# Patient Record
Sex: Male | Born: 1950
Health system: Southern US, Community
[De-identification: ages and names within clinical notes are randomized; demographics above are authoritative.]

## PROBLEM LIST (undated history)

## (undated) DIAGNOSIS — C4491 Basal cell carcinoma of skin, unspecified: Secondary | ICD-10-CM

## (undated) DIAGNOSIS — E785 Hyperlipidemia, unspecified: Secondary | ICD-10-CM

## (undated) DIAGNOSIS — R0989 Other specified symptoms and signs involving the circulatory and respiratory systems: Secondary | ICD-10-CM

## (undated) DIAGNOSIS — R7303 Prediabetes: Secondary | ICD-10-CM

## (undated) DIAGNOSIS — M1990 Unspecified osteoarthritis, unspecified site: Secondary | ICD-10-CM

## (undated) DIAGNOSIS — E291 Testicular hypofunction: Secondary | ICD-10-CM

## (undated) DIAGNOSIS — E559 Vitamin D deficiency, unspecified: Secondary | ICD-10-CM

## (undated) DIAGNOSIS — K219 Gastro-esophageal reflux disease without esophagitis: Secondary | ICD-10-CM

## (undated) DIAGNOSIS — I700 Atherosclerosis of aorta: Secondary | ICD-10-CM

## (undated) HISTORY — PX: OTHER SURGICAL HISTORY: SHX169

## (undated) HISTORY — DX: Testicular hypofunction: E29.1

## (undated) HISTORY — DX: Prediabetes: R73.03

## (undated) HISTORY — DX: Basal cell carcinoma of skin, unspecified: C44.91

## (undated) HISTORY — DX: Other specified symptoms and signs involving the circulatory and respiratory systems: R09.89

## (undated) HISTORY — PX: SKIN CANCER EXCISION: SHX779

## (undated) HISTORY — DX: Vitamin D deficiency, unspecified: E55.9

## (undated) HISTORY — DX: Hyperlipidemia, unspecified: E78.5

---

## 1986-06-22 HISTORY — PX: KNEE ARTHROSCOPY: SHX127

## 2002-05-26 ENCOUNTER — Emergency Department (HOSPITAL_COMMUNITY): Admission: EM | Admit: 2002-05-26 | Discharge: 2002-05-26 | Payer: Self-pay | Admitting: Emergency Medicine

## 2004-08-01 LAB — HM COLONOSCOPY

## 2011-06-19 ENCOUNTER — Ambulatory Visit (HOSPITAL_COMMUNITY)
Admission: RE | Admit: 2011-06-19 | Discharge: 2011-06-19 | Disposition: A | Discharge status: Home / Self Care | Payer: 59 | Source: Ambulatory Visit | Attending: Internal Medicine | Admitting: Internal Medicine

## 2011-06-19 ENCOUNTER — Other Ambulatory Visit (HOSPITAL_COMMUNITY): Payer: Self-pay | Admitting: Internal Medicine

## 2011-06-19 DIAGNOSIS — R062 Wheezing: Secondary | ICD-10-CM | POA: Insufficient documentation

## 2011-06-19 DIAGNOSIS — F17200 Nicotine dependence, unspecified, uncomplicated: Secondary | ICD-10-CM | POA: Insufficient documentation

## 2013-01-29 IMAGING — CR DG CHEST 2V
2 series · 2 of 2 positions shown · non-contrast
Comparison: None.

CLINICAL DATA: Wheezing.  Smoker.

CHEST - 2 VIEW

[view not recorded (1 of 2)]
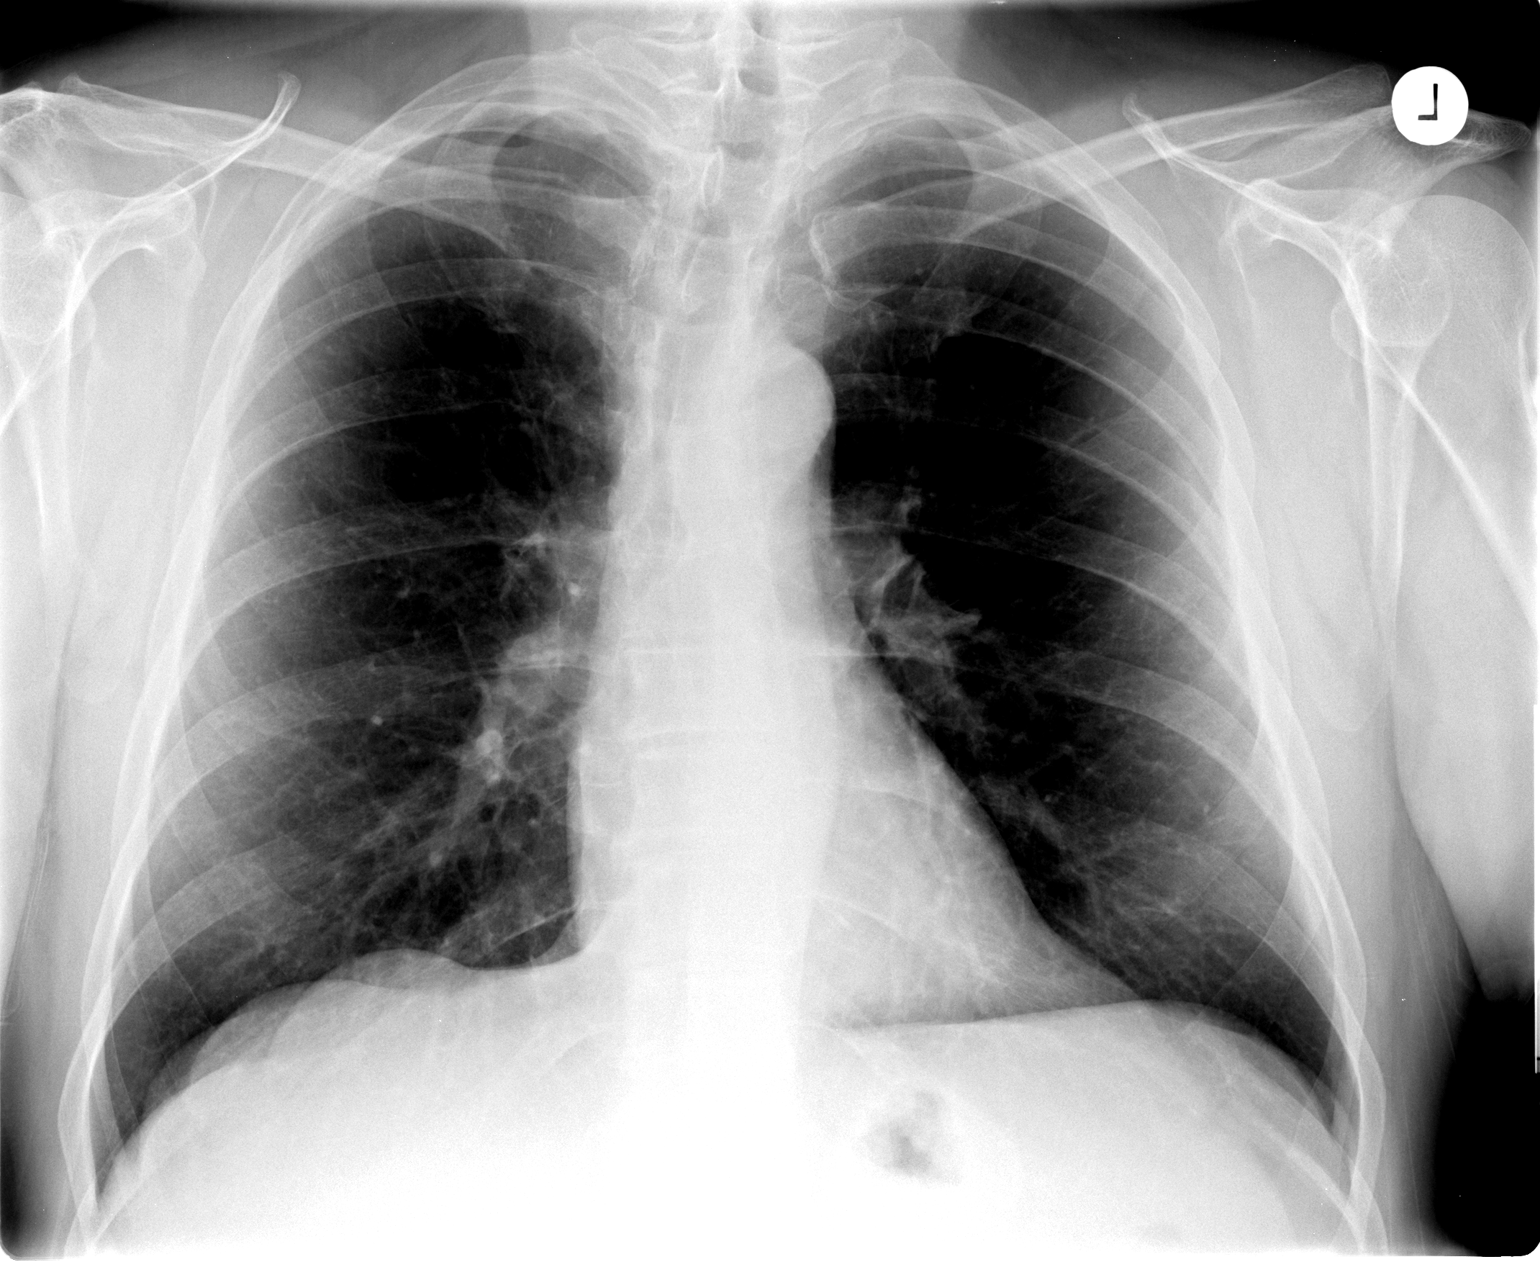

[view not recorded (2 of 2)]
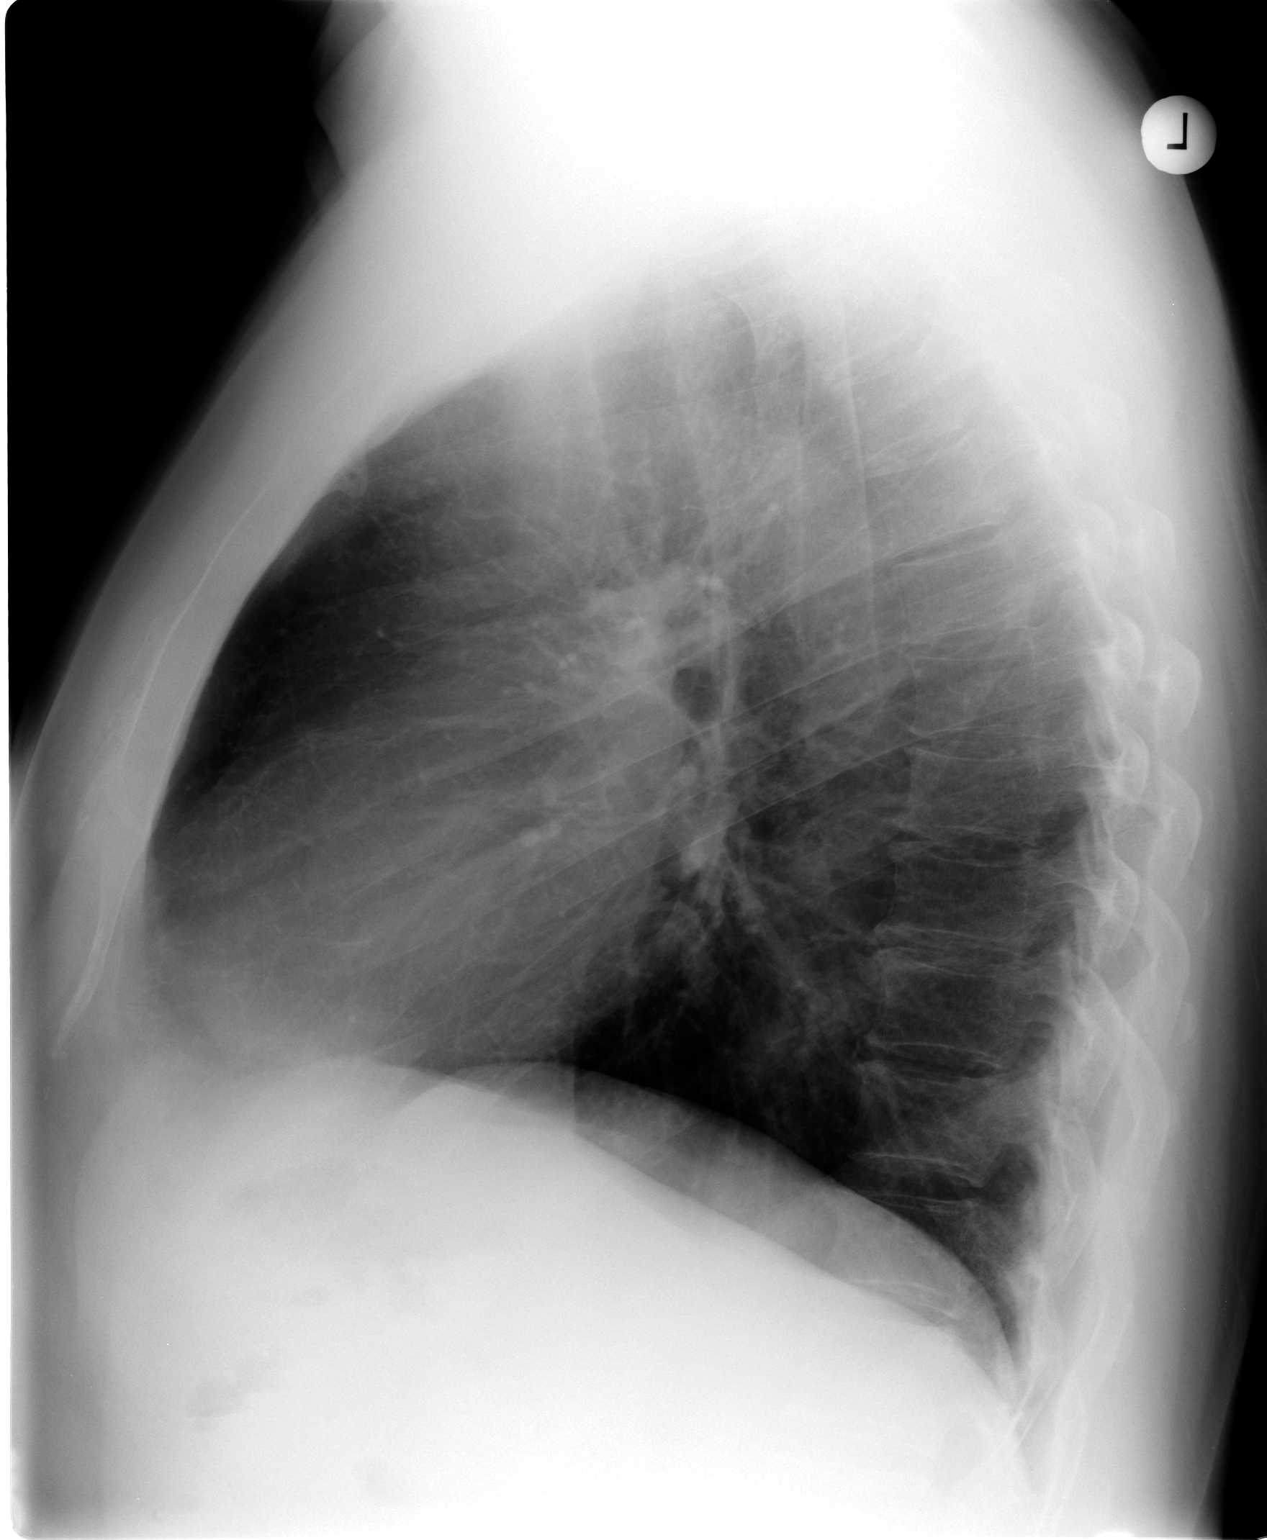

[2 of 2 positions shown; findings below may reference images not displayed]

FINDINGS: Normal heart, mediastinal, and hilar contours.  Lung
volumes are within normal limits.  The lungs are clear.  Negative
for pleural effusion or pneumothorax.  The trachea is midline.
Slight convex right scoliosis of the upper thoracic spine.
IMPRESSION: No acute cardiopulmonary disease.

## 2013-09-25 DIAGNOSIS — E782 Mixed hyperlipidemia: Secondary | ICD-10-CM | POA: Insufficient documentation

## 2013-09-25 DIAGNOSIS — E291 Testicular hypofunction: Secondary | ICD-10-CM | POA: Insufficient documentation

## 2013-09-25 DIAGNOSIS — R7309 Other abnormal glucose: Secondary | ICD-10-CM | POA: Insufficient documentation

## 2013-09-25 DIAGNOSIS — E559 Vitamin D deficiency, unspecified: Secondary | ICD-10-CM | POA: Insufficient documentation

## 2013-09-25 DIAGNOSIS — E785 Hyperlipidemia, unspecified: Secondary | ICD-10-CM

## 2013-09-25 DIAGNOSIS — R0989 Other specified symptoms and signs involving the circulatory and respiratory systems: Secondary | ICD-10-CM | POA: Insufficient documentation

## 2013-09-27 ENCOUNTER — Encounter: Payer: Self-pay | Admitting: Internal Medicine

## 2013-10-05 ENCOUNTER — Ambulatory Visit (INDEPENDENT_AMBULATORY_CARE_PROVIDER_SITE_OTHER): Payer: BC Managed Care – PPO | Admitting: Physician Assistant

## 2013-10-05 ENCOUNTER — Encounter: Payer: Self-pay | Admitting: Physician Assistant

## 2013-10-05 VITALS — BP 118/62 | HR 76 | Temp 98.5°F | Resp 16 | Ht 73.0 in | Wt 222.0 lb

## 2013-10-05 DIAGNOSIS — Z79899 Other long term (current) drug therapy: Secondary | ICD-10-CM

## 2013-10-05 DIAGNOSIS — Z125 Encounter for screening for malignant neoplasm of prostate: Secondary | ICD-10-CM

## 2013-10-05 DIAGNOSIS — R0989 Other specified symptoms and signs involving the circulatory and respiratory systems: Secondary | ICD-10-CM

## 2013-10-05 DIAGNOSIS — Z0000 Encounter for general adult medical examination without abnormal findings: Secondary | ICD-10-CM

## 2013-10-05 DIAGNOSIS — E559 Vitamin D deficiency, unspecified: Secondary | ICD-10-CM

## 2013-10-05 DIAGNOSIS — R7303 Prediabetes: Secondary | ICD-10-CM

## 2013-10-05 DIAGNOSIS — I10 Essential (primary) hypertension: Secondary | ICD-10-CM

## 2013-10-05 DIAGNOSIS — Z1211 Encounter for screening for malignant neoplasm of colon: Secondary | ICD-10-CM

## 2013-10-05 LAB — CBC WITH DIFFERENTIAL/PLATELET
Basophils Absolute: 0.1 10*3/uL (ref 0.0–0.1)
Basophils Relative: 1 % (ref 0–1)
Eosinophils Absolute: 0.1 10*3/uL (ref 0.0–0.7)
Eosinophils Relative: 1 % (ref 0–5)
HCT: 41.9 % (ref 39.0–52.0)
Hemoglobin: 15 g/dL (ref 13.0–17.0)
Lymphocytes Relative: 35 % (ref 12–46)
Lymphs Abs: 2.1 10*3/uL (ref 0.7–4.0)
MCH: 34.7 pg — ABNORMAL HIGH (ref 26.0–34.0)
MCHC: 35.8 g/dL (ref 30.0–36.0)
MCV: 97 fL (ref 78.0–100.0)
Monocytes Absolute: 0.4 10*3/uL (ref 0.1–1.0)
Monocytes Relative: 7 % (ref 3–12)
Neutro Abs: 3.4 10*3/uL (ref 1.7–7.7)
Neutrophils Relative %: 56 % (ref 43–77)
Platelets: 279 10*3/uL (ref 150–400)
RBC: 4.32 MIL/uL (ref 4.22–5.81)
RDW: 12.8 % (ref 11.5–15.5)
WBC: 6 10*3/uL (ref 4.0–10.5)

## 2013-10-05 LAB — BASIC METABOLIC PANEL WITH GFR
BUN: 21 mg/dL (ref 6–23)
CO2: 27 mEq/L (ref 19–32)
Calcium: 9.3 mg/dL (ref 8.4–10.5)
Chloride: 103 mEq/L (ref 96–112)
Creat: 1.06 mg/dL (ref 0.50–1.35)
GFR, Est African American: 86 mL/min
GFR, Est Non African American: 74 mL/min
Glucose, Bld: 87 mg/dL (ref 70–99)
Potassium: 4.3 mEq/L (ref 3.5–5.3)
Sodium: 138 mEq/L (ref 135–145)

## 2013-10-05 LAB — LIPID PANEL
CHOL/HDL RATIO: 4.9 ratio
Cholesterol: 166 mg/dL (ref 0–200)
HDL: 34 mg/dL — ABNORMAL LOW (ref >39)
LDL CALC: 64 mg/dL (ref 0–99)
Triglycerides: 340 mg/dL — ABNORMAL HIGH (ref <150)
VLDL: 68 mg/dL — AB (ref 0–40)

## 2013-10-05 LAB — IRON AND TIBC
%SAT: 33 % (ref 20–55)
Iron: 113 ug/dL (ref 42–165)
TIBC: 343 ug/dL (ref 215–435)
UIBC: 230 ug/dL (ref 125–400)

## 2013-10-05 LAB — VITAMIN B12: VITAMIN B 12: 351 pg/mL (ref 211–911)

## 2013-10-05 LAB — HEPATIC FUNCTION PANEL
ALT: 29 U/L (ref 0–53)
AST: 25 U/L (ref 0–37)
Albumin: 4.7 g/dL (ref 3.5–5.2)
Alkaline Phosphatase: 73 U/L (ref 39–117)
Bilirubin, Direct: 0.1 mg/dL (ref 0.0–0.3)
Indirect Bilirubin: 0.5 mg/dL (ref 0.2–1.2)
Total Bilirubin: 0.6 mg/dL (ref 0.2–1.2)
Total Protein: 7.1 g/dL (ref 6.0–8.3)

## 2013-10-05 LAB — TESTOSTERONE: TESTOSTERONE: 242 ng/dL — AB (ref 300–890)

## 2013-10-05 LAB — MAGNESIUM: Magnesium: 2 mg/dL (ref 1.5–2.5)

## 2013-10-05 LAB — HEMOGLOBIN A1C
Hgb A1c MFr Bld: 5.6 % (ref <5.7)
Mean Plasma Glucose: 114 mg/dL (ref <117)

## 2013-10-05 LAB — TSH: TSH: 1.466 u[IU]/mL (ref 0.350–4.500)

## 2013-10-05 NOTE — Patient Instructions (Addendum)
Preventative Care for Adults, Male       REGULAR HEALTH EXAMS:  A routine yearly physical is a good way to check in with your primary care provider about your health and preventive screening. It is also an opportunity to share updates about your health and any concerns you have, and receive a thorough all-over exam.   Most health insurance companies pay for at least some preventative services.  Check with your health plan for specific coverages.  WHAT PREVENTATIVE SERVICES DO MEN NEED?  Adult men should have their weight and blood pressure checked regularly.   Men age 35 and older should have their cholesterol levels checked regularly.  Beginning at age 50 and continuing to age 75, men should be screened for colorectal cancer.  Certain people should may need continued testing until age 85.  Other cancer screening may include exams for testicular and prostate cancer.  Updating vaccinations is part of preventative care.  Vaccinations help protect against diseases such as the flu.  Lab tests are generally done as part of preventative care to screen for anemia and blood disorders, to screen for problems with the kidneys and liver, to screen for bladder problems, to check blood sugar, and to check your cholesterol level.  Preventative services generally include counseling about diet, exercise, avoiding tobacco, drugs, excessive alcohol consumption, and sexually transmitted infections.    GENERAL RECOMMENDATIONS FOR GOOD HEALTH:  Healthy diet:  Eat a variety of foods, including fruit, vegetables, animal or vegetable protein, such as meat, fish, chicken, and eggs, or beans, lentils, tofu, and grains, such as rice.  Drink plenty of water daily.  Decrease saturated fat in the diet, avoid lots of red meat, processed foods, sweets, fast foods, and fried foods.  Exercise:  Aerobic exercise helps maintain good heart health. At least 30-40 minutes of moderate-intensity exercise is recommended.  For example, a brisk walk that increases your heart rate and breathing. This should be done on most days of the week.   Find a type of exercise or a variety of exercises that you enjoy so that it becomes a part of your daily life.  Examples are running, walking, swimming, water aerobics, and biking.  For motivation and support, explore group exercise such as aerobic class, spin class, Zumba, Yoga,or  martial arts, etc.    Set exercise goals for yourself, such as a certain weight goal, walk or run in a race such as a 5k walk/run.  Speak to your primary care provider about exercise goals.  Disease prevention:  If you smoke or chew tobacco, find out from your caregiver how to quit. It can literally save your life, no matter how long you have been a tobacco user. If you do not use tobacco, never begin.   Maintain a healthy diet and normal weight. Increased weight leads to problems with blood pressure and diabetes.   The Body Mass Index or BMI is a way of measuring how much of your body is fat. Having a BMI above 27 increases the risk of heart disease, diabetes, hypertension, stroke and other problems related to obesity. Your caregiver can help determine your BMI and based on it develop an exercise and dietary program to help you achieve or maintain this important measurement at a healthful level.  High blood pressure causes heart and blood vessel problems.  Persistent high blood pressure should be treated with medicine if weight loss and exercise do not work.   Fat and cholesterol leaves deposits in your arteries   that can block them. This causes heart disease and vessel disease elsewhere in your body.  If your cholesterol is found to be high, or if you have heart disease or certain other medical conditions, then you may need to have your cholesterol monitored frequently and be treated with medication.   Ask if you should have a stress test if your history suggests this. A stress test is a test done on  a treadmill that looks for heart disease. This test can find disease prior to there being a problem.  Avoid drinking alcohol in excess (more than two drinks per day).  Avoid use of street drugs. Do not share needles with anyone. Ask for professional help if you need assistance or instructions on stopping the use of alcohol, cigarettes, and/or drugs.  Brush your teeth twice a day with fluoride toothpaste, and floss once a day. Good oral hygiene prevents tooth decay and gum disease. The problems can be painful, unattractive, and can cause other health problems. Visit your dentist for a routine oral and dental check up and preventive care every 6-12 months.   Look at your skin regularly.  Use a mirror to look at your back. Notify your caregivers of changes in moles, especially if there are changes in shapes, colors, a size larger than a pencil eraser, an irregular border, or development of new moles.  Safety:  Use seatbelts 100% of the time, whether driving or as a passenger.  Use safety devices such as hearing protection if you work in environments with loud noise or significant background noise.  Use safety glasses when doing any work that could send debris in to the eyes.  Use a helmet if you ride a bike or motorcycle.  Use appropriate safety gear for contact sports.  Talk to your caregiver about gun safety.  Use sunscreen with a SPF (or skin protection factor) of 15 or greater.  Lighter skinned people are at a greater risk of skin cancer. Don't forget to also wear sunglasses in order to protect your eyes from too much damaging sunlight. Damaging sunlight can accelerate cataract formation.   Practice safe sex. Use condoms. Condoms are used for birth control and to help reduce the spread of sexually transmitted infections (or STIs).  Some of the STIs are gonorrhea (the clap), chlamydia, syphilis, trichomonas, herpes, HPV (human papilloma virus) and HIV (human immunodeficiency virus) which causes AIDS.  The herpes, HIV and HPV are viral illnesses that have no cure. These can result in disability, cancer and death.   Keep carbon monoxide and smoke detectors in your home functioning at all times. Change the batteries every 6 months or use a model that plugs into the wall.   Vaccinations:  Stay up to date with your tetanus shots and other required immunizations. You should have a booster for tetanus every 10 years. Be sure to get your flu shot every year, since 5%-20% of the [place] population comes down with the flu. The flu vaccine changes each year, so being vaccinated once is not enough. Get your shot in the fall, before the flu season peaks.   Other vaccines to consider:  Pneumococcal vaccine to protect against certain types of pneumonia.  This is normally recommended for adults age 65 or older.  However, adults younger than 65 years old with certain underlying conditions such as diabetes, heart or lung disease should also receive the vaccine.  Shingles vaccine to protect against Varicella Zoster if you are older than age 60, or younger   than 64 years old with certain underlying illness.  Hepatitis A vaccine to protect against a form of infection of the liver by a virus acquired from food.  Hepatitis B vaccine to protect against a form of infection of the liver by a virus acquired from blood or body fluids, particularly if you work in health care.  If you plan to travel internationally, check with your local health department for specific vaccination recommendations.  Cancer Screening:  Most routine colon cancer screening begins at the age of 102. On a yearly basis, doctors may provide special easy to use take-home tests to check for hidden blood in the stool. Sigmoidoscopy or colonoscopy can detect the earliest forms of colon cancer and is life saving. These tests use a small camera at the end of a tube to directly examine the colon. Speak to your caregiver about this at age 45, when routine  screening begins (and is repeated every 5 years unless early forms of pre-cancerous polyps or small growths are found).   At the age of 45 men usually start screening for prostate cancer every year. Screening may begin at a younger age for those with higher risk. Those at higher risk include African-Americans or having a family history of prostate cancer. There are two types of tests for prostate cancer:   Prostate-specific antigen (PSA) testing. Recent studies raise questions about prostate cancer using PSA and you should discuss this with your caregiver.   Digital rectal exam (in which your doctor's lubricated and gloved finger feels for enlargement of the prostate through the anus).   Screening for testicular cancer.  Do a monthly exam of your testicles. Gently roll each testicle between your thumb and fingers, feeling for any abnormal lumps. The best time to do this is after a hot shower or bath when the tissues are looser. Notify your caregivers of any lumps, tenderness or changes in size or shape immediately.    Lateral Collateral Knee Ligament Sprain with Phase I Rehab The lateral collateral ligament (LCL) of the knee helps hold the knee joint in proper alignment and prevents the bones from shifting out of alignment (displacing) toward the outside (laterally). Injury to the knee may cause a tear in the LCL ligament (sprain). The LCL is the least common ligament of the knee to be injured. Sprains may heal on their own, but they often result in a loose joint. Sprains are classified into three categories. Grade 1 sprains cause pain, but the tendon is not lengthened. Grade 2 sprains include a lengthened ligament, due to the ligament being stretched or partially ruptured. With grade 2 sprains there is still function, although the function may be decreased. Grade 3 sprains involve a complete tear of the tendon or muscle, and function is usually impaired. SYMPTOMS   Pain and tenderness on the outer  side of the knee.  A "pop", tearing, or pulling sensation at the time of injury.  Bruising (contusion) at the site of injury within 48 hours of injury.  Knee stiffness.  Limping, often walking with the knee bent. CAUSES  An LCL sprain occurs when a force is placed on the ligament that is greater than it can handle. Common causes of injury include:  Direct hit (trauma) to the inner side of the knee, especially if the foot is planted on the ground.  Forceful pivoting of the body and leg, while the foot is planted on the ground. RISK INCREASES WITH:  Contact sports (football, rugby).  Sports that require  pivoting or cutting (soccer).  Poor knee strength and flexibility.  Improper equipment use. PREVENTION   Warm up and stretch properly before activity.  Maintain physical fitness:  Strength, flexibility, and endurance.  Cardiovascular fitness.  Wear properly fitted protective equipment (correct length of cleats for surface).  Functional braces may be effective in preventing injury. PROGNOSIS  If treated properly, LCL tears usually heal on their own. Sometimes, surgery is required. RELATED COMPLICATIONS   Frequently recurring symptoms, such as knee giving way, instability, and swelling.  Injury to other structures in the knee joint.  Meniscal cartilage, resulting in locking and swelling of the knee.  Articular cartilage, resulting in knee arthritis.  Other ligaments of the knee (commonly).  Injury to nerves, causing numbness of the outer leg, foot, and ankle and weakness or paralysis, with inability to raise the ankle, big toe, or lesser toes.  Knee stiffness (loss of knee motion). TREATMENT  Treatment first involves the use of ice and medicine, to reduce pain and inflammation. The use of strengthening and stretching exercises may help reduce pain with activity. These exercises may be performed at home, but referral to a therapist is often advised. You may be advised  to walk with crutches, until you are able to walk without a limp. Your caregiver may provide you with a hinged knee brace to help regain a full range of motion, while also protecting the injured knee. For severe LCL injuries, or injuries that involve other ligaments of the knee, surgery is often advised. MEDICATION   If pain medicine is needed, nonsteroidal anti-inflammatory medicines (aspirin and ibuprofen), or other minor pain relievers (acetaminophen), are often advised.  Do not take pain medicine for 7 days before surgery.  Prescription pain relievers may be given, if your caregiver thinks they are needed. Use only as directed and only as much as you need. HEAT AND COLD  Cold treatment (icing) should be applied for 10 to 15 minutes every 2 to 3 hours for inflammation and pain, and immediately after activity that aggravates your symptoms. Use ice packs or an ice massage.  Heat treatment may be used before performing stretching and strengthening activities prescribed by your caregiver, physical therapist, or athletic trainer. Use a heat pack or a warm water soak. SEEK MEDICAL CARE IF:   Symptoms get worse or do not improve in 4 to 6 weeks, despite treatment.  New, unexplained symptoms develop. (Drugs used in treatment may produce side effects.) EXERCISES RANGE OF MOTION (ROM) AND STRETCHING EXERCISES - Lateral Collateral Knee Ligament Sprain Phase I These are some of the initial exercises that your physician, physical therapist or athletic trainer may have you perform to begin your rehabilitation. When you demonstrate gains in your flexibility and strength, your caregiver may progress you to Phase II exercises. As you perform these exercises, remember:   These initial exercises are intended to be gentle. They will help you restore motion without increasing any swelling.  Completing these exercises allows less painful movement and prepares you for the more aggressive strengthening exercises  in Phase II.  An effective stretch should be held for at least 30 seconds.  A stretch should never be painful. You should only feel a gentle lengthening or release in the stretched tissue. RANGE OF MOTION - Knee Flexion, Active  Lie on your back with both knees straight. (If this causes back discomfort, bend your opposite knee, placing your foot flat on the floor.)  Slowly slide your heel back toward your buttocks until you  feel a gentle stretch in the front of your knee or thigh.  Hold for __________ seconds. Slowly slide your heel back to the starting position. Repeat __________ times. Complete this exercise __________ times per day.  STRETCH - Knee Flexion, Supine  Lie on the floor with your right / left heel and foot lightly touching the wall. (Place both feet on the wall, if you do not use a door frame.)  Without using any effort, allow gravity to slide your foot down the wall slowly until you feel a gentle stretch in the front of your right / left knee.  Hold this stretch for __________ seconds. Then return the leg to the starting position, using your healthy leg for help, if needed. Repeat __________ times. Complete this stretch __________ times per day.  RANGE OF MOTION - Knee Flexion and Extension, Active-Assisted  Sit on the edge of a table or chair with your thighs firmly supported. It may be helpful to place a folded towel under the end of your right / left thigh.  Flexion (bending): Place the ankle of your healthy leg on top of the other ankle. Use your healthy leg to gently bend your right / left knee until you feel a mild tension across the top of your knee.  Hold for __________ seconds.  Extension (straightening): Switch your ankles so your right / left leg is on top. Use your healthy leg to straighten your right / left knee until you feel a mild tension on the backside of your knee.  Hold for __________ seconds. Repeat __________ times. Complete this exercise  __________ times per day. STRETCH - Knee Extension Sitting  Sit with yourright / left leg/heel propped on another chair, coffee table, or foot stool.  Allow your leg muscles to relax, letting gravity straighten out your knee.*  You should feel a stretch behind your right / left knee. Hold this position for __________ seconds. Repeat __________ times. Complete this stretch __________ times per day.  *Your physician, physical therapist or athletic trainer may instruct you place a __________ weight on your thigh, just above your kneecap, to deepen the stretch.  STRENGTHENING EXERCISES Lateral Collateral Knee Ligament Sprain - Phase I These exercises may help you when beginning to rehabilitate your injury. They may resolve your symptoms with or without further involvement from your physician, physical therapist or athletic trainer. While completing these exercises, remember:   Muscles can gain both the endurance and the strength needed for everyday activities through controlled exercises.  Complete these exercises as instructed by your physician, physical therapist or athletic trainer. Increase the resistance and repetitions only as guided.  In order to return to more demanding activities, you will likely need to progress to more challenging exercises. Your physician, physical therapist or athletic trainer will advance your exercises when your tissues show adequate healing and your muscles demonstrate increased strength. STRENGTH - Quadriceps, Isometrics  Lie on your back with your right / left leg extended and your opposite knee bent.  Gradually tense the muscles in the front of yourright / left thigh. You should see either your knee cap slide up toward your hip or increased dimpling just above the knee. This motion will push the back of the knee down toward the floor, mat, or bed on which you are lying.  Hold the muscle as tight as you can without increasing your pain for __________  seconds.  Relax the muscles slowly and completely between each repetition. Repeat __________ times. Complete this exercise  __________ times per day.  STRENGTH - Quadriceps, Short Arcs   Lie on your back. Place a __________ inch towel roll under your right / left knee, so that the knee bends slightly.  Raise only your lower leg by tightening the muscles in the front of your thigh. Do not allow your thigh to rise.  Hold this position for __________ seconds. Repeat __________ times. Complete this exercise __________ times per day.  OPTIONAL ANKLE WEIGHTS: Begin with ____________________, but DO NOT exceed ____________________. Increase in 1 pound/0.5 kilogram increments. STRENGTH - Quadriceps, Straight Leg Raises  Quality counts! Watch for signs that the quadriceps muscle is working, to be sure you are strengthening the correct muscles and not "cheating" by substituting with healthier muscles.  Lay on your back with your right / left leg extended and your opposite knee bent.  Tense the muscles in the front of your right / leftthigh. You should see either your knee cap slide up or increased dimpling just above the knee. Your thigh may even shake a bit.  Tighten these muscles even more and raise your leg 4 to 6 inches off the floor. Hold for __________ seconds.  Keeping these muscles tense, lower your leg.  Relax the muscles slowly and completely in between each repetition. Repeat __________ times. Complete this exercise __________ times per day.  STRENGTH - Hamstring, Isometrics   Lie on your back, on a firm surface.  Bend your right / left knee approximately __________ degrees.  Dig your heel into the surface as if you are trying to pull it toward your buttocks. Tighten the muscles in the back of your thighs to "dig" as hard as you can, without increasing any pain.  Hold this position for __________ seconds.  Release the tension gradually and allow your muscle to completely relax for  __________ seconds in between each exercise. Repeat __________ times. Complete this exercise __________ times per day.  STRENGTH - Hamstring, Curls   Lay on your stomach with your legs extended. (If you lay on a bed, your feet may hang over the edge.)  Tighten the muscles in the back of your thigh to bend your right / left knee up to 90 degrees. Keep your hips flat on the bed.  Hold this position for __________ seconds.  Slowly lower your leg back to the starting position. Repeat __________ times. Complete this exercise __________ times per day.  OPTIONAL ANKLE WEIGHTS: Begin with ____________________, but DO NOT exceed ____________________. Increase in 1 pound/0.5 kilogram increments. Document Released: 06/08/2005 Document Revised: 08/31/2011 Document Reviewed: 09/20/2008 [facility] Patient Information 2014 [place], [place].

## 2013-10-05 NOTE — Progress Notes (Signed)
Complete Physical HPI 63 y.o. male  presents for a complete physical. His blood pressure has been controlled at home, today their BP is BP: 118/62 mmHg He does workout, on a treadmill 3 x a week. He denies chest pain, shortness of breath, dizziness.  He is not on cholesterol medication and denies myalgias. His cholesterol is not at goal. The cholesterol last visit was:  Trigs 356, HDL 34, LDL 56- he has been decreasing alcohol.   Last A1C in the office was: 5.4 Hypogonadism- he is no longer on testosterone therapy.  Patient is on Vitamin D supplement. 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 He has had his left knee repaired and states it seems similar. He has popping, clicking, locking in that knee.  Current Medications:    Medication List       This list is accurate as of: 10/05/13 10:29 AM.  Always use your most recent med list.               ANDROGEL 40.5 MG/2.5GM (1.62%) Gel  Generic drug:  Testosterone  Place onto the skin.     BABY ASPIRIN PO  Take 81 mg by mouth daily.     citalopram 40 MG tablet  Commonly known as:  CELEXA  Take 40 mg by mouth daily.     VITAMIN D PO  Take 10,000 Int'l Units by mouth daily.        Health Maintenance:  Immunization History  Administered Date(s) Administered  . Influenza Split 04/13/2013  . Pneumococcal-Unspecified 06/22/2002  . Tdap 09/17/2011    Tetanus:2013 Pneumovax: 2004 Flu vaccine: 2013 Zostavax: states he had shingles in the past, will check titer DEXA: N/A Colonoscopy: 2006 due 2016 EGD: N/A  Allergies:  Allergies  Allergen Reactions  . Lipitor [Atorvastatin]   . Lopid [Gemfibrozil]   . Penicillins    Medical History:  Past Medical History  Diagnosis Date  . Labile hypertension   . Prediabetes   . Other testicular hypofunction   . Hyperlipidemia   . Vitamin D deficiency    Surgical History:  Past Surgical History  Procedure Laterality Date   . Knee arthroscopy Left 1988   Family History:  Family History  Problem Relation Age of Onset  . Diabetes Mother   . Alzheimer's disease Mother   . Heart disease Father   . Hypertension Father   . Stroke Father   . Colon polyps Father    Social History:   History  Substance Use Topics  . Smoking status: Former Research scientist (life sciences)  . Smokeless tobacco: Not on file  . Alcohol Use: Yes     Comment: Rare   ROS:  [X]  = complains of  [ ]  = denies  General: Fatigue [ ]  Fever [ ]  Chills [ ]  Weakness [ ]   Insomnia [ ]  Weight change [ ]  Night sweats [ ]   Change in appetite [ ]  Eyes: Redness [ ]  Blurred vision [ ]  Diplopia [ ]  Discharge [ ]   ENT: Congestion [ ]  Sinus Pain [ ]  Post Nasal Drip [ ]  Sore Throat [ ]  Earache [ ]  hearing loss [ ]  Tinnitus [ ]  Snoring [ ]   Cardiac: Chest pain/pressure [ ]  SOB [ ]  Orthopnea [ ]   Palpitations [ ]   Paroxysmal nocturnal dyspnea[ ]  Claudication [ ]  Edema [ ]   Pulmonary: Cough [ ]  Wheezing[ ]   SOB [ ]   Pleurisy [ ]   GI: Nausea [ ]   Vomiting[ ]  Dysphagia[ ]  Heartburn[ ]  Abdominal pain [ ]  Constipation [ ] ; Diarrhea [ ]  BRBPR [ ]  Melena[ ]  Bloating [ ]  Hemorrhoids [ ]   GU: Hematuria[ ]  Dysuria [ ]  Nocturia[ ]  Urgency [ ]   Hesitancy [ ]  Discharge [ ]  Frequency [ ]   Neuro: Headaches[ ]  Vertigo[ ]  Paresthesias[ ]  Spasm [ ]  Speech changes [ ]  Incoordination [ ]   Ortho: Arthritis [ ]  Joint pain [ ]  Muscle pain [ ]  Joint swelling [ ]  Back Pain [ ]  Skin:  Rash [ ]   Pruritis [ ]  Change in skin lesion [ ]   Psych: Depression[ ]  Anxiety[ ]  Confusion [ ]  Memory loss [ ]   Heme/Lypmh: Bleeding [ ]  Bruising [ ]  Enlarged lymph nodes [ ]   Endocrine: Visual blurring [ ]  Paresthesia [ ]  Polyuria [ ]  Polydypsea [ ]    Heat/cold intolerance [ ]  Hypoglycemia [ ]   Physical Exam: Estimated body mass index is 29.3 kg/(m^2) as calculated from the following:   Height as of this encounter: 6\' 1"  (1.854 m).   Weight as of this encounter: 222 lb (100.699 kg). BP 118/62  Pulse 76  Temp(Src)  98.5 F (36.9 C)  Resp 16  Ht 6\' 1"  (1.854 m)  Wt 222 lb (100.699 kg)  BMI 29.30 kg/m2 General Appearance: Well nourished, in no apparent distress. Eyes: PERRLA, EOMs, conjunctiva no swelling or erythema, normal fundi and vessels. Sinuses: No Frontal/maxillary tenderness ENT/Mouth: Ext aud canals clear, normal light reflex with TMs without erythema, bulging. Good dentition. No erythema, swelling, or exudate on post pharynx. Tonsils not swollen or erythematous. Hearing normal.  Neck: Supple, thyroid normal. No bruits Respiratory: Respiratory effort normal, BS equal bilaterally without rales, rhonchi, wheezing or stridor. Cardio: RRR without murmurs, rubs or gallops. Brisk peripheral pulses without edema.  Chest: symmetric, with normal excursions and percussion. Abdomen: Soft, +BS. Non tender, no guarding, rebound, hernias, masses, or organomegaly. .  Lymphatics: Non tender without lymphadenopathy.  Genitourinary: defer Musculoskeletal: Full ROM all peripheral extremities,5/5 strength, and normal gait. Skin: Warm, dry without rashes, lesions, ecchymosis.  Neuro: Cranial nerves intact, reflexes equal bilaterally. Normal muscle tone, no cerebellar symptoms. Sensation intact.  Psych: Awake and oriented X 3, normal affect, Insight and Judgment appropriate.   EKG: WNL no changes.  Assessment and Plan: Labile hypertension-- continue medications, DASH diet, exercise and monitor at home. Call if greater than 130/80.   Prediabetes-Discussed general issues about diabetes pathophysiology and management., Educational material distributed., Suggested low cholesterol diet., Encouraged aerobic exercise., Discussed foot care., Reminded to get yearly retinal exam.  Other testicular hypofunction- check testosterone  Hyperlipidemia- -continue medications, check lipids, decrease fatty foods, increase activity.   Vitamin D deficiency- cont vitamin D  POC hemoccult ? Meniscus or lateral collteral ligament  tear- follow up with ortho  Discussed med's effects and SE's. Screening labs and tests as requested with regular follow-up as recommended.  Vicie Mutters 10:17 AM

## 2013-10-06 LAB — URINALYSIS, ROUTINE W REFLEX MICROSCOPIC
Bilirubin Urine: NEGATIVE
Glucose, UA: NEGATIVE mg/dL
HGB URINE DIPSTICK: NEGATIVE
Ketones, ur: NEGATIVE mg/dL
Leukocytes, UA: NEGATIVE
NITRITE: NEGATIVE
PROTEIN: NEGATIVE mg/dL
Specific Gravity, Urine: 1.017 (ref 1.005–1.030)
Urobilinogen, UA: 0.2 mg/dL (ref 0.0–1.0)
pH: 6 (ref 5.0–8.0)

## 2013-10-06 LAB — VITAMIN D 25 HYDROXY (VIT D DEFICIENCY, FRACTURES): VIT D 25 HYDROXY: 70 ng/mL (ref 30–89)

## 2013-10-06 LAB — MICROALBUMIN / CREATININE URINE RATIO
Creatinine, Urine: 128.1 mg/dL
MICROALB UR: 0.5 mg/dL (ref 0.00–1.89)
MICROALB/CREAT RATIO: 3.9 mg/g (ref 0.0–30.0)

## 2013-10-06 LAB — PSA: PSA: 0.35 ng/mL (ref <=4.00)

## 2013-11-16 ENCOUNTER — Other Ambulatory Visit: Payer: Self-pay | Admitting: Emergency Medicine

## 2014-04-06 ENCOUNTER — Encounter: Payer: Self-pay | Admitting: Physician Assistant

## 2014-04-06 ENCOUNTER — Ambulatory Visit (INDEPENDENT_AMBULATORY_CARE_PROVIDER_SITE_OTHER): Payer: BC Managed Care – PPO | Admitting: Physician Assistant

## 2014-04-06 VITALS — BP 128/72 | HR 68 | Temp 97.9°F | Resp 16 | Ht 73.0 in | Wt 228.0 lb

## 2014-04-06 DIAGNOSIS — I10 Essential (primary) hypertension: Secondary | ICD-10-CM

## 2014-04-06 DIAGNOSIS — E291 Testicular hypofunction: Secondary | ICD-10-CM

## 2014-04-06 DIAGNOSIS — E785 Hyperlipidemia, unspecified: Secondary | ICD-10-CM

## 2014-04-06 DIAGNOSIS — F339 Major depressive disorder, recurrent, unspecified: Secondary | ICD-10-CM | POA: Insufficient documentation

## 2014-04-06 DIAGNOSIS — Z23 Encounter for immunization: Secondary | ICD-10-CM

## 2014-04-06 DIAGNOSIS — F418 Other specified anxiety disorders: Secondary | ICD-10-CM

## 2014-04-06 DIAGNOSIS — R7303 Prediabetes: Secondary | ICD-10-CM

## 2014-04-06 DIAGNOSIS — R7309 Other abnormal glucose: Secondary | ICD-10-CM

## 2014-04-06 DIAGNOSIS — R0989 Other specified symptoms and signs involving the circulatory and respiratory systems: Secondary | ICD-10-CM

## 2014-04-06 DIAGNOSIS — Z79899 Other long term (current) drug therapy: Secondary | ICD-10-CM

## 2014-04-06 DIAGNOSIS — F329 Major depressive disorder, single episode, unspecified: Secondary | ICD-10-CM | POA: Insufficient documentation

## 2014-04-06 DIAGNOSIS — E559 Vitamin D deficiency, unspecified: Secondary | ICD-10-CM

## 2014-04-06 LAB — LIPID PANEL
CHOL/HDL RATIO: 4.2 ratio
Cholesterol: 156 mg/dL (ref 0–200)
HDL: 37 mg/dL — AB (ref >39)
LDL CALC: 80 mg/dL (ref 0–99)
TRIGLYCERIDES: 197 mg/dL — AB (ref <150)
VLDL: 39 mg/dL (ref 0–40)

## 2014-04-06 LAB — CBC WITH DIFFERENTIAL/PLATELET
BASOS ABS: 0.1 10*3/uL (ref 0.0–0.1)
Basophils Relative: 1 % (ref 0–1)
Eosinophils Absolute: 0.1 10*3/uL (ref 0.0–0.7)
Eosinophils Relative: 2 % (ref 0–5)
HCT: 44.4 % (ref 39.0–52.0)
Hemoglobin: 15.5 g/dL (ref 13.0–17.0)
LYMPHS PCT: 40 % (ref 12–46)
Lymphs Abs: 2.2 10*3/uL (ref 0.7–4.0)
MCH: 34.4 pg — ABNORMAL HIGH (ref 26.0–34.0)
MCHC: 34.9 g/dL (ref 30.0–36.0)
MCV: 98.4 fL (ref 78.0–100.0)
Monocytes Absolute: 0.4 10*3/uL (ref 0.1–1.0)
Monocytes Relative: 8 % (ref 3–12)
NEUTROS ABS: 2.6 10*3/uL (ref 1.7–7.7)
Neutrophils Relative %: 49 % (ref 43–77)
Platelets: 266 10*3/uL (ref 150–400)
RBC: 4.51 MIL/uL (ref 4.22–5.81)
RDW: 12.7 % (ref 11.5–15.5)
WBC: 5.4 10*3/uL (ref 4.0–10.5)

## 2014-04-06 LAB — HEPATIC FUNCTION PANEL
ALK PHOS: 76 U/L (ref 39–117)
ALT: 36 U/L (ref 0–53)
AST: 24 U/L (ref 0–37)
Albumin: 4.4 g/dL (ref 3.5–5.2)
BILIRUBIN DIRECT: 0.1 mg/dL (ref 0.0–0.3)
BILIRUBIN INDIRECT: 0.7 mg/dL (ref 0.2–1.2)
TOTAL PROTEIN: 6.8 g/dL (ref 6.0–8.3)
Total Bilirubin: 0.8 mg/dL (ref 0.2–1.2)

## 2014-04-06 LAB — BASIC METABOLIC PANEL WITH GFR
BUN: 15 mg/dL (ref 6–23)
CHLORIDE: 105 meq/L (ref 96–112)
CO2: 26 mEq/L (ref 19–32)
Calcium: 9.4 mg/dL (ref 8.4–10.5)
Creat: 1.04 mg/dL (ref 0.50–1.35)
GFR, EST NON AFRICAN AMERICAN: 76 mL/min
GFR, Est African American: 88 mL/min
Glucose, Bld: 114 mg/dL — ABNORMAL HIGH (ref 70–99)
POTASSIUM: 4.2 meq/L (ref 3.5–5.3)
Sodium: 138 mEq/L (ref 135–145)

## 2014-04-06 LAB — MAGNESIUM: Magnesium: 1.9 mg/dL (ref 1.5–2.5)

## 2014-04-06 LAB — TSH: TSH: 1.311 u[IU]/mL (ref 0.350–4.500)

## 2014-04-06 LAB — HEMOGLOBIN A1C
Hgb A1c MFr Bld: 5.6 % (ref <5.7)
Mean Plasma Glucose: 114 mg/dL (ref <117)

## 2014-04-06 NOTE — Progress Notes (Signed)
Assessment and Plan:  Hypertension: Continue medication, monitor blood pressure at home. Continue DASH diet. Cholesterol: Continue diet and exercise. Check cholesterol.  Pre-diabetes-Continue diet and exercise. Check A1C Vitamin D Def- check level and continue medications.   Continue diet and meds as discussed. Further disposition pending results of labs.  HPI 63 y.o. male  presents for 3 month follow up with hypertension, hyperlipidemia, prediabetes and vitamin D. His blood pressure has been controlled at home, today their BP is BP: 128/72 mmHg He does workout. He denies chest pain, shortness of breath, dizziness.  He is not on cholesterol medication and denies myalgias. His cholesterol is at goal. The cholesterol last visit was:   Lab Results  Component Value Date   CHOL 166 10/05/2013   HDL 34* 10/05/2013   LDLCALC 64 10/05/2013   TRIG 340* 10/05/2013   CHOLHDL 4.9 10/05/2013   He has been working on diet and exercise for prediabetes, and denies paresthesia of the feet, polydipsia and polyuria. Last A1C in the office was:  Lab Results  Component Value Date   HGBA1C 5.6 10/05/2013   Patient is on Vitamin D supplement.   Lab Results  Component Value Date   VD25OH 33 10/05/2013     He was on testosterone but states he felt it did not help him so stopped.  He is on celexa for anxiety/depression and is in remission.   Current Medications:  Current Outpatient Prescriptions on File Prior to Visit  Medication Sig Dispense Refill  . BABY ASPIRIN PO Take 81 mg by mouth daily.      . Cholecalciferol (VITAMIN D PO) Take 10,000 Int'l Units by mouth daily.      . citalopram (CELEXA) 40 MG tablet TAKE ONE TABLET BY MOUTH ONE TIME DAILY   90 tablet  0   No current facility-administered medications on file prior to visit.   Medical History:  Past Medical History  Diagnosis Date  . Labile hypertension   . Prediabetes   . Other testicular hypofunction   . Hyperlipidemia   . Vitamin D  deficiency    Allergies:  Allergies  Allergen Reactions  . Lipitor [Atorvastatin]   . Lopid [Gemfibrozil]   . Penicillins      Review of Systems: [X]  = complains of  [ ]  = denies  General: Fatigue [ ]  Fever [ ]  Chills [ ]  Weakness [ ]   Insomnia [ ]  Eyes: Redness [ ]  Blurred vision [ ]  Diplopia [ ]   ENT: Congestion [ ]  Sinus Pain [ ]  Post Nasal Drip [ ]  Sore Throat [ ]  Earache [ ]   Cardiac: Chest pain/pressure [ ]  SOB [ ]  Orthopnea [ ]   Palpitations [ ]   Paroxysmal nocturnal dyspnea[ ]  Claudication [ ]  Edema [ ]   Pulmonary: Cough [ ]  Wheezing[ ]   SOB [ ]   Snoring [ ]   GI: Nausea [ ]  Vomiting[ ]  Dysphagia[ ]  Heartburn[ ]  Abdominal pain [ ]  Constipation [ ] ; Diarrhea [ ] ; BRBPR [ ]  Melena[ ]  GU: Hematuria[ ]  Dysuria [ ]  Nocturia[ ]  Urgency [ ]   Hesitancy [ ]  Discharge [ ]  Neuro: Headaches[ ]  Vertigo[ ]  Paresthesias[ ]  Spasm [ ]  Speech changes [ ]  Incoordination [ ]   Ortho: Arthritis [ ]  Joint pain [ ]  Muscle pain [ ]  Joint swelling [ ]  Back Pain [ ]  Skin:  Rash [ ]   Pruritis [ ]  Change in skin lesion [ ]   Psych: Depression[ ]  Anxiety[ ]  Confusion [ ]  Memory loss [ ]   Heme/Lypmh:  Bleeding [ ]  Bruising [ ]  Enlarged lymph nodes [ ]   Endocrine: Visual blurring [ ]  Paresthesia [ ]  Polyuria [ ]  Polydypsea [ ]    Heat/cold intolerance [ ]  Hypoglycemia [ ]   Family history- Review and unchanged Social history- Review and unchanged Physical Exam: BP 128/72  Pulse 68  Temp(Src) 97.9 F (36.6 C)  Resp 16  Ht 6\' 1"  (1.854 m)  Wt 228 lb (103.42 kg)  BMI 30.09 kg/m2 Wt Readings from Last 3 Encounters:  04/06/14 228 lb (103.42 kg)  10/05/13 222 lb (100.699 kg)   General Appearance: Well nourished, in no apparent distress. Eyes: PERRLA, EOMs, conjunctiva no swelling or erythema Sinuses: No Frontal/maxillary tenderness ENT/Mouth: Ext aud canals clear, TMs without erythema, bulging. No erythema, swelling, or exudate on post pharynx.  Tonsils not swollen or erythematous. Hearing normal.  Neck:  Supple, thyroid normal.  Respiratory: Respiratory effort normal, BS equal bilaterally without rales, rhonchi, wheezing or stridor.  Cardio: RRR with no MRGs. Brisk peripheral pulses without edema.  Abdomen: Soft, + BS.  Non tender, no guarding, rebound, hernias, masses. Lymphatics: Non tender without lymphadenopathy.  Musculoskeletal: Full ROM, 5/5 strength, normal gait.  Skin: Warm, dry without rashes, lesions, ecchymosis.  Neuro: Cranial nerves intact. Normal muscle tone, no cerebellar symptoms. Sensation intact.  Psych: Awake and oriented X 3, normal affect, Insight and Judgment appropriate.    Vicie Mutters 8:37 AM

## 2014-04-06 NOTE — Patient Instructions (Signed)

## 2014-04-07 LAB — VITAMIN D 25 HYDROXY (VIT D DEFICIENCY, FRACTURES): Vit D, 25-Hydroxy: 72 ng/mL (ref 30–89)

## 2014-04-07 LAB — INSULIN, FASTING: Insulin fasting, serum: 16.2 u[IU]/mL (ref 2.0–19.6)

## 2014-04-23 ENCOUNTER — Other Ambulatory Visit: Payer: Self-pay | Admitting: Physician Assistant

## 2014-10-11 ENCOUNTER — Encounter: Payer: Self-pay | Admitting: Internal Medicine

## 2014-10-11 ENCOUNTER — Ambulatory Visit (INDEPENDENT_AMBULATORY_CARE_PROVIDER_SITE_OTHER): Payer: BLUE CROSS/BLUE SHIELD | Admitting: Internal Medicine

## 2014-10-11 VITALS — BP 124/76 | HR 80 | Temp 97.8°F | Resp 18 | Ht 73.0 in | Wt 223.0 lb

## 2014-10-11 DIAGNOSIS — R7309 Other abnormal glucose: Secondary | ICD-10-CM

## 2014-10-11 DIAGNOSIS — R0989 Other specified symptoms and signs involving the circulatory and respiratory systems: Secondary | ICD-10-CM

## 2014-10-11 DIAGNOSIS — R7303 Prediabetes: Secondary | ICD-10-CM

## 2014-10-11 DIAGNOSIS — R5383 Other fatigue: Secondary | ICD-10-CM

## 2014-10-11 DIAGNOSIS — Z79899 Other long term (current) drug therapy: Secondary | ICD-10-CM | POA: Insufficient documentation

## 2014-10-11 DIAGNOSIS — E291 Testicular hypofunction: Secondary | ICD-10-CM

## 2014-10-11 DIAGNOSIS — Z1212 Encounter for screening for malignant neoplasm of rectum: Secondary | ICD-10-CM

## 2014-10-11 DIAGNOSIS — I10 Essential (primary) hypertension: Secondary | ICD-10-CM

## 2014-10-11 DIAGNOSIS — E785 Hyperlipidemia, unspecified: Secondary | ICD-10-CM

## 2014-10-11 DIAGNOSIS — E559 Vitamin D deficiency, unspecified: Secondary | ICD-10-CM

## 2014-10-11 DIAGNOSIS — Z125 Encounter for screening for malignant neoplasm of prostate: Secondary | ICD-10-CM

## 2014-10-11 LAB — CBC WITH DIFFERENTIAL/PLATELET
BASOS PCT: 1 % (ref 0–1)
Basophils Absolute: 0.1 10*3/uL (ref 0.0–0.1)
Eosinophils Absolute: 0.1 10*3/uL (ref 0.0–0.7)
Eosinophils Relative: 2 % (ref 0–5)
HCT: 42.2 % (ref 39.0–52.0)
HEMOGLOBIN: 14.7 g/dL (ref 13.0–17.0)
Lymphocytes Relative: 40 % (ref 12–46)
Lymphs Abs: 2.3 10*3/uL (ref 0.7–4.0)
MCH: 34.5 pg — AB (ref 26.0–34.0)
MCHC: 34.8 g/dL (ref 30.0–36.0)
MCV: 99.1 fL (ref 78.0–100.0)
MONOS PCT: 8 % (ref 3–12)
MPV: 9.5 fL (ref 8.6–12.4)
Monocytes Absolute: 0.5 10*3/uL (ref 0.1–1.0)
NEUTROS ABS: 2.8 10*3/uL (ref 1.7–7.7)
NEUTROS PCT: 49 % (ref 43–77)
Platelets: 295 10*3/uL (ref 150–400)
RBC: 4.26 MIL/uL (ref 4.22–5.81)
RDW: 13 % (ref 11.5–15.5)
WBC: 5.7 10*3/uL (ref 4.0–10.5)

## 2014-10-11 LAB — BASIC METABOLIC PANEL WITH GFR
BUN: 14 mg/dL (ref 6–23)
CALCIUM: 8.8 mg/dL (ref 8.4–10.5)
CO2: 26 meq/L (ref 19–32)
Chloride: 104 mEq/L (ref 96–112)
Creat: 1.05 mg/dL (ref 0.50–1.35)
GFR, Est African American: 86 mL/min
GFR, Est Non African American: 75 mL/min
GLUCOSE: 109 mg/dL — AB (ref 70–99)
Potassium: 4.2 mEq/L (ref 3.5–5.3)
Sodium: 138 mEq/L (ref 135–145)

## 2014-10-11 LAB — HEMOGLOBIN A1C
Hgb A1c MFr Bld: 5.4 % (ref <5.7)
Mean Plasma Glucose: 108 mg/dL (ref <117)

## 2014-10-11 LAB — HEPATIC FUNCTION PANEL
ALK PHOS: 73 U/L (ref 39–117)
ALT: 21 U/L (ref 0–53)
AST: 17 U/L (ref 0–37)
Albumin: 3.9 g/dL (ref 3.5–5.2)
BILIRUBIN DIRECT: 0.1 mg/dL (ref 0.0–0.3)
BILIRUBIN TOTAL: 0.4 mg/dL (ref 0.2–1.2)
Indirect Bilirubin: 0.3 mg/dL (ref 0.2–1.2)
Total Protein: 6.3 g/dL (ref 6.0–8.3)

## 2014-10-11 LAB — TSH: TSH: 1.316 u[IU]/mL (ref 0.350–4.500)

## 2014-10-11 LAB — IRON AND TIBC
%SAT: 22 % (ref 20–55)
IRON: 70 ug/dL (ref 42–165)
TIBC: 324 ug/dL (ref 215–435)
UIBC: 254 ug/dL (ref 125–400)

## 2014-10-11 LAB — LIPID PANEL
Cholesterol: 143 mg/dL (ref 0–200)
HDL: 33 mg/dL — ABNORMAL LOW (ref >=40)
LDL CALC: 71 mg/dL (ref 0–99)
Total CHOL/HDL Ratio: 4.3 Ratio
Triglycerides: 194 mg/dL — ABNORMAL HIGH (ref <150)
VLDL: 39 mg/dL (ref 0–40)

## 2014-10-11 LAB — MAGNESIUM: Magnesium: 1.8 mg/dL (ref 1.5–2.5)

## 2014-10-11 LAB — VITAMIN B12: Vitamin B-12: 369 pg/mL (ref 211–911)

## 2014-10-11 MED ORDER — MONTELUKAST SODIUM 10 MG PO TABS: ORAL_TABLET | ORAL | Status: DC

## 2014-10-11 NOTE — Progress Notes (Signed)
Patient ID: Eric [name], male   DOB: 1950-11-08, 64 y.o.   MRN: [id number]  Annual Comprehensive Examination  This very nice 64 y.o. mwm presents for complete physical.  Patient has been followed for HTN, Prediabetes, Hyperlipidemia, and Vitamin D Deficiency.   Labile HTN predates since 2002 and has been monitored expectantly. Patient's BP has been controlled at home.Today's BP: 124/76 mmHg. Patient denies any cardiac symptoms as chest pain, palpitations, shortness of breath, dizziness or ankle swelling.   Patient's hyperlipidemia is controlled with diet and medications. Patient denies myalgias or other medication SE's. Last lipids were at goal -  Total Chol  156; HDL 37; LDL 80; Trig 197 on 04/06/2014   Patient has prediabetes since 2013 with an A1c 5.8%  and patient denies reactive hypoglycemic symptoms, visual blurring, diabetic polys or paresthesias. Last A1c was 5.6% on 04/06/2014.   Finally, patient has history of Vitamin D Deficiency of 27 in 2008 and last vitamin D was 72 on 04/06/2014.  Medication Sig  . BABY ASPIRIN PO Take 81 mg by mouth daily.  . Cholecalciferol (VITAMIN D PO) Take 10,000 Int'l Units by mouth daily.  . citalopram (CELEXA) 40 MG tablet TAKE ONE TABLET BY MOUTH ONE TIME DAILY    Allergies  Allergen Reactions  . Lipitor [Atorvastatin]   . Lopid [Gemfibrozil]   . Penicillins    Past Medical History  Diagnosis Date  . Labile hypertension   . Prediabetes   . Other testicular hypofunction   . Hyperlipidemia   . Vitamin D deficiency    Health Maintenance  Topic Date Due  . HIV Screening  05/18/1965  . COLONOSCOPY  05/18/2000  . ZOSTAVAX  05/18/2010  . INFLUENZA VACCINE  01/21/2015  . TETANUS/TDAP  09/16/2021   Immunization History  Administered Date(s) Administered  . Influenza Split 04/13/2013, 04/06/2014  . Pneumococcal-Unspecified 06/22/2002  . Tdap 09/17/2011   Past Surgical History  Procedure Laterality Date  . Knee arthroscopy Left 1988    Family History  Problem Relation Age of Onset  . Diabetes Mother   . Alzheimer's disease Mother   . Heart disease Father   . Hypertension Father   . Stroke Father   . Colon polyps Father     History   Social History  . Marital Status: Married    Spouse Name: N/A  . Number of Children: N/A  . Years of Education: N/A   Occupational History  . Not on file.   Social History Main Topics  . Smoking status: Former Smoker    Quit date: 10/10/2004  . Smokeless tobacco: Not on file  . Alcohol Use: 0.0 oz/week    0 Standard drinks or equivalent per week     Comment: Rare  . Drug Use: No  . Sexual Activity: Not on file    ROS Constitutional: Denies fever, chills, weight loss/gain, headaches, insomnia,  night sweats or change in appetite. Does c/o fatigue. Eyes: Denies redness, blurred vision, diplopia, discharge, itchy or watery eyes.  ENT: Denies discharge, congestion, post nasal drip, epistaxis, sore throat, earache, hearing loss, dental pain, Tinnitus, Vertigo, Sinus pain or snoring.  Cardio: Denies chest pain, palpitations, irregular heartbeat, syncope, dyspnea, diaphoresis, orthopnea, PND, claudication or edema Respiratory: denies cough, dyspnea, DOE, pleurisy, hoarseness, laryngitis or wheezing.  Gastrointestinal: Denies dysphagia, heartburn, reflux, water brash, pain, cramps, nausea, vomiting, bloating, diarrhea, constipation, hematemesis, melena, hematochezia, jaundice or hemorrhoids Genitourinary: Denies dysuria, frequency, urgency, nocturia, hesitancy, discharge, hematuria or flank pain Musculoskeletal: Denies arthralgia, myalgia, stiffness,  Jt. Swelling, pain, limp or strain/sprain. Denies Falls. Skin: Denies puritis, rash, hives, warts, acne, eczema or change in skin lesion Neuro: No weakness, tremor, incoordination, spasms, paresthesia or pain Psychiatric: Denies confusion, memory loss or sensory loss. Denies Depression. Endocrine: Denies change in weight, skin, hair  change, nocturia, and paresthesia, diabetic polys, visual blurring or hyper / hypo glycemic episodes.  Heme/Lymph: No excessive bleeding, bruising or enlarged lymph nodes.  Physical Exam  BP 124/76   Pulse 80  Temp 97.8 F   Resp 18  Ht 6\' 1"    Wt 223 lb     BMI 29.43  General Appearance: Well nourished, in no apparent distress. Eyes: PERRLA, EOMs, conjunctiva no swelling or erythema, normal fundi and vessels. Sinuses: No frontal/maxillary tenderness ENT/Mouth: EACs patent / TMs  nl. Nares clear without erythema, swelling, mucoid exudates. Oral hygiene is good. No erythema, swelling, or exudate. Tongue normal, non-obstructing. Tonsils not swollen or erythematous. Hearing normal.  Neck: Supple, thyroid normal. No bruits, nodes or JVD. Respiratory: Respiratory effort normal.  BS equal and clear bilateral without rales, rhonci, wheezing or stridor. Cardio: Heart sounds are normal with regular rate and rhythm and no murmurs, rubs or gallops. Peripheral pulses are normal and equal bilaterally without edema. No aortic or femoral bruits. Chest: symmetric with normal excursions and percussion.  Abdomen: Flat, soft, with bowl sounds. Nontender, no guarding, rebound, hernias, masses, or organomegaly.  Lymphatics: Non tender without lymphadenopathy.  Genitourinary: No hernias.Testes nl. DRE - prostate nl for age - smooth & firm w/o nodules. Musculoskeletal: Full ROM all peripheral extremities, joint stability, 5/5 strength, and normal gait. Skin: Warm and dry without rashes, lesions, cyanosis, clubbing or  ecchymosis.  Neuro: Cranial nerves intact, reflexes equal bilaterally. Normal muscle tone, no cerebellar symptoms. Sensation intact.  Pysch: Awake and oriented X 3 with normal affect, insight and judgment appropriate.   Assessment and Plan  1. Labile hypertension  - Microalbumin / creatinine urine ratio - EKG 12-Lead - [place], RETROPERITNL ABD,  LTD  2. Hyperlipidemia  - Lipid panel  3.  Prediabetes  - Hemoglobin A1c - Insulin, random  4. Vitamin D deficiency  - Vit D  25 hydroxy   5. Hypogonadism male  - Testosterone  6. Screening for rectal cancer  - POC Hemoccult Bld/Stl  7. Prostate cancer screening  - PSA  8. Other fatigue  - Vitamin B12 - Iron and TIBC - TSH  9. Medication management  - Urine Microscopic - CBC with Differential/Platelet - BASIC METABOLIC PANEL WITH GFR - Hepatic function panel - Magnesium   Continue prudent diet as discussed, weight control, BP monitoring, regular exercise, and medications as discussed.  Discussed med effects and SE's. Routine screening labs and tests as requested with regular follow-up as recommended. Over 40 minutes of exam, counseling &  chart review was performed

## 2014-10-11 NOTE — Patient Instructions (Signed)

## 2014-10-12 LAB — MICROALBUMIN / CREATININE URINE RATIO
CREATININE, URINE: 291.7 mg/dL
MICROALB/CREAT RATIO: 4.5 mg/g (ref 0.0–30.0)
Microalb, Ur: 1.3 mg/dL (ref <2.0)

## 2014-10-12 LAB — PSA: PSA: 1.22 ng/mL (ref <=4.00)

## 2014-10-12 LAB — URINALYSIS, MICROSCOPIC ONLY
BACTERIA UA: NONE SEEN
CASTS: NONE SEEN
CRYSTALS: NONE SEEN
SQUAMOUS EPITHELIAL / LPF: NONE SEEN

## 2014-10-12 LAB — VITAMIN D 25 HYDROXY (VIT D DEFICIENCY, FRACTURES): Vit D, 25-Hydroxy: 58 ng/mL (ref 30–100)

## 2014-10-12 LAB — INSULIN, RANDOM: Insulin: 17.4 u[IU]/mL (ref 2.0–19.6)

## 2014-10-12 LAB — TESTOSTERONE: Testosterone: 339 ng/dL (ref 300–890)

## 2015-04-15 ENCOUNTER — Encounter: Payer: Self-pay | Admitting: Internal Medicine

## 2015-04-15 ENCOUNTER — Ambulatory Visit (INDEPENDENT_AMBULATORY_CARE_PROVIDER_SITE_OTHER): Payer: BLUE CROSS/BLUE SHIELD | Admitting: Internal Medicine

## 2015-04-15 VITALS — BP 138/80 | HR 88 | Temp 98.0°F | Resp 18 | Ht 73.0 in | Wt 229.0 lb

## 2015-04-15 DIAGNOSIS — E785 Hyperlipidemia, unspecified: Secondary | ICD-10-CM | POA: Diagnosis not present

## 2015-04-15 DIAGNOSIS — Z79899 Other long term (current) drug therapy: Secondary | ICD-10-CM

## 2015-04-15 DIAGNOSIS — R7303 Prediabetes: Secondary | ICD-10-CM | POA: Diagnosis not present

## 2015-04-15 DIAGNOSIS — R0989 Other specified symptoms and signs involving the circulatory and respiratory systems: Secondary | ICD-10-CM

## 2015-04-15 DIAGNOSIS — I10 Essential (primary) hypertension: Secondary | ICD-10-CM

## 2015-04-15 DIAGNOSIS — Z23 Encounter for immunization: Secondary | ICD-10-CM | POA: Diagnosis not present

## 2015-04-15 DIAGNOSIS — E559 Vitamin D deficiency, unspecified: Secondary | ICD-10-CM
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
## 2015-04-25 ENCOUNTER — Other Ambulatory Visit: Payer: Self-pay | Admitting: Internal Medicine

## 2015-10-17 ENCOUNTER — Other Ambulatory Visit: Payer: Self-pay | Admitting: Internal Medicine

## 2015-10-17 DIAGNOSIS — J0410 Acute tracheitis without obstruction: Secondary | ICD-10-CM

## 2015-10-17 MED ORDER — AZITHROMYCIN 250 MG PO TABS: ORAL_TABLET | ORAL | Status: DC

## 2015-10-17 MED ORDER — PREDNISONE 20 MG PO TABS: ORAL_TABLET | ORAL | Status: DC

## 2015-10-25 ENCOUNTER — Ambulatory Visit (INDEPENDENT_AMBULATORY_CARE_PROVIDER_SITE_OTHER): Payer: BLUE CROSS/BLUE SHIELD | Admitting: Internal Medicine

## 2015-10-25 ENCOUNTER — Encounter: Payer: Self-pay | Admitting: Internal Medicine

## 2015-10-25 VITALS — BP 128/88 | HR 92 | Temp 97.5°F | Resp 16 | Ht 73.0 in | Wt 226.4 lb

## 2015-10-25 DIAGNOSIS — Z1212 Encounter for screening for malignant neoplasm of rectum: Secondary | ICD-10-CM

## 2015-10-25 DIAGNOSIS — Z0000 Encounter for general adult medical examination without abnormal findings: Secondary | ICD-10-CM | POA: Diagnosis not present

## 2015-10-25 DIAGNOSIS — Z125 Encounter for screening for malignant neoplasm of prostate: Secondary | ICD-10-CM

## 2015-10-25 DIAGNOSIS — R5383 Other fatigue: Secondary | ICD-10-CM

## 2015-10-25 DIAGNOSIS — E559 Vitamin D deficiency, unspecified: Secondary | ICD-10-CM | POA: Diagnosis not present

## 2015-10-25 DIAGNOSIS — Z23 Encounter for immunization: Secondary | ICD-10-CM

## 2015-10-25 DIAGNOSIS — E349 Endocrine disorder, unspecified: Secondary | ICD-10-CM

## 2015-10-25 DIAGNOSIS — I10 Essential (primary) hypertension: Secondary | ICD-10-CM

## 2015-10-25 DIAGNOSIS — Z136 Encounter for screening for cardiovascular disorders: Secondary | ICD-10-CM

## 2015-10-25 DIAGNOSIS — Z79899 Other long term (current) drug therapy: Secondary | ICD-10-CM

## 2015-10-25 DIAGNOSIS — R0989 Other specified symptoms and signs involving the circulatory and respiratory systems: Secondary | ICD-10-CM

## 2015-10-25 DIAGNOSIS — R7303 Prediabetes: Secondary | ICD-10-CM

## 2015-10-25 DIAGNOSIS — Z0001 Encounter for general adult medical examination with abnormal findings: Secondary | ICD-10-CM

## 2015-10-25 DIAGNOSIS — E785 Hyperlipidemia, unspecified: Secondary | ICD-10-CM

## 2015-10-25 LAB — CBC WITH DIFFERENTIAL/PLATELET
Basophils Absolute: 85 cells/uL (ref 0–200)
Basophils Relative: 1 %
EOS ABS: 340 {cells}/uL (ref 15–500)
Eosinophils Relative: 4 %
HEMATOCRIT: 47.4 % (ref 38.5–50.0)
Hemoglobin: 16.2 g/dL (ref 13.2–17.1)
LYMPHS PCT: 31 %
Lymphs Abs: 2635 cells/uL (ref 850–3900)
MCH: 34.2 pg — AB (ref 27.0–33.0)
MCHC: 34.2 g/dL (ref 32.0–36.0)
MCV: 100 fL (ref 80.0–100.0)
MONO ABS: 680 {cells}/uL (ref 200–950)
MPV: 8.8 fL (ref 7.5–12.5)
Monocytes Relative: 8 %
NEUTROS PCT: 56 %
Neutro Abs: 4760 cells/uL (ref 1500–7800)
Platelets: 323 10*3/uL (ref 140–400)
RBC: 4.74 MIL/uL (ref 4.20–5.80)
RDW: 13 % (ref 11.0–15.0)
WBC: 8.5 10*3/uL (ref 3.8–10.8)

## 2015-10-25 LAB — BASIC METABOLIC PANEL WITH GFR
BUN: 14 mg/dL (ref 7–25)
CALCIUM: 9.5 mg/dL (ref 8.6–10.3)
CO2: 23 mmol/L (ref 20–31)
Chloride: 101 mmol/L (ref 98–110)
Creat: 1.17 mg/dL (ref 0.70–1.25)
GFR, EST AFRICAN AMERICAN: 75 mL/min (ref >=60)
GFR, Est Non African American: 65 mL/min (ref >=60)
GLUCOSE: 99 mg/dL (ref 65–99)
Potassium: 4.4 mmol/L (ref 3.5–5.3)
Sodium: 137 mmol/L (ref 135–146)

## 2015-10-25 LAB — LIPID PANEL
CHOLESTEROL: 164 mg/dL (ref 125–200)
HDL: 38 mg/dL — ABNORMAL LOW (ref >=40)
LDL CALC: 72 mg/dL (ref <130)
TRIGLYCERIDES: 268 mg/dL — AB (ref <150)
Total CHOL/HDL Ratio: 4.3 Ratio (ref <=5.0)
VLDL: 54 mg/dL — ABNORMAL HIGH (ref <30)

## 2015-10-25 LAB — HEPATIC FUNCTION PANEL
ALBUMIN: 4.4 g/dL (ref 3.6–5.1)
ALT: 43 U/L (ref 9–46)
AST: 29 U/L (ref 10–35)
Alkaline Phosphatase: 84 U/L (ref 40–115)
Bilirubin, Direct: 0.2 mg/dL (ref <=0.2)
Indirect Bilirubin: 0.6 mg/dL (ref 0.2–1.2)
TOTAL PROTEIN: 6.9 g/dL (ref 6.1–8.1)
Total Bilirubin: 0.8 mg/dL (ref 0.2–1.2)

## 2015-10-25 LAB — MAGNESIUM: Magnesium: 2 mg/dL (ref 1.5–2.5)

## 2015-10-25 LAB — IRON AND TIBC
%SAT: 36 % (ref 15–60)
Iron: 126 ug/dL (ref 50–180)
TIBC: 349 ug/dL (ref 250–425)
UIBC: 223 ug/dL (ref 125–400)

## 2015-10-25 NOTE — Progress Notes (Signed)
Patient ID: PEPPER ERTMAN, male   DOB: 16-May-1951, 65 y.o.   MRN: [id number]  [facility] ADULT & ADOLESCENT INTERNAL MEDICINE   Unk Pinto, M.D.    Uvaldo Bristle. Silverio Lay, P.A.-C      Starlyn Skeans, P.A.-C   [facility]                [address] [place], [place] [id number] Telephone [phone] Telefax [phone] _________________________________  Annual  Screening/Preventative Visit And Comprehensive Evaluation & Examination     This very nice 65 y.o. MWM presents for a Wellness/Preventative Visit & comprehensive evaluation and management of multiple medical co-morbidities.  Patient has been followed for labile HTN, Prediabetes, Hyperlipidemia and Vitamin D Deficiency.     Patient has hx/o labile HTN predating circa 2002 and has been monitored expectantly with active surveillance. Patient's BP has been controlled at home and today's BP: 128/88 mmHg. Patient denies any cardiac symptoms as chest pain, palpitations, shortness of breath, dizziness or ankle swelling.     Patient's hyperlipidemia is controlled with diet and medications. Patient denies myalgias or other medication SE's. Last lipids were at goal with T Chol 143, HDL 33, LDL 71 and elevated Trig 194 in Apr 2016.      Patient has prediabetes since 2013 with A1c 5.8% and then down to 5.6% in 2015. and patient denies reactive hypoglycemic symptoms, visual blurring, diabetic polys or paresthesias. Last A1c was 5.4% in Apr 2016.      Finally, patient has history of Vitamin D Deficiency of "29" in 2008  and last vitamin D was 58 in Apr 2016.    Medication Sig  . BABY ASPIRIN PO Take 81 mg by mouth 2 (two) times daily.   . Cholecalciferol (VITAMIN D PO) Take 10,000 Int'l Units by mouth daily.  . citalopram (CELEXA) 40 MG tablet TAKE ONE TABLET BY MOUTH ONE TIME DAILY  . montelukast (SINGULAIR) 10 MG tablet Take 1 tablet daily for allergies   Allergies  Allergen Reactions  .  Lipitor [Atorvastatin]   . Lopid [Gemfibrozil]   . Penicillins    Past Medical History  Diagnosis Date  . Labile hypertension   . Prediabetes   . Other testicular hypofunction   . Hyperlipidemia   . Vitamin D deficiency    Health Maintenance  Topic Date Due  . Hepatitis C Screening  [birth year]  . HIV Screening  05/18/1965  . COLONOSCOPY  05/18/2000  . ZOSTAVAX  05/18/2010  . PNA vac Low Risk Adult (1 of 2 - PCV13) 05/19/2015  . INFLUENZA VACCINE  01/21/2016  . TETANUS/TDAP  09/16/2021   Immunization History  Administered Date(s) Administered  . Influenza Split 04/13/2013, 04/06/2014, 04/15/2015  . Pneumococcal Conjugate-13 10/25/2015  . Pneumococcal-Unspecified 06/22/2002  . Tdap 09/17/2011   Past Surgical History  Procedure Laterality Date  . Knee arthroscopy Left 1988   Family History  Problem Relation Age of Onset  . Diabetes Mother   . Alzheimer's disease Mother   . Heart disease Father   . Hypertension Father   . Stroke Father   . Colon polyps Father     Social History   Social History  . Marital Status: Married    Spouse Name: N/A  . Number of Children: N/A  . Years of Education: N/A   Occupational History  . Retired M[employer]   Social History Main Topics  . Smoking status:  Former Smoker    Quit date: 10/10/2004  . Smokeless tobacco: Not on file  . Alcohol Use: 0.0 oz/week    0 Standard drinks or equivalent per week     Comment: Rare  . Drug Use: No  . Sexual Activity: Active    ROS Constitutional: Denies fever, chills, weight loss/gain, headaches, insomnia,  night sweats or change in appetite. Does c/o fatigue. Eyes: Denies redness, blurred vision, diplopia, discharge, itchy or watery eyes.  ENT: Denies discharge, congestion, post nasal drip, epistaxis, sore throat, earache, hearing loss, dental pain, Tinnitus, Vertigo, Sinus pain or snoring.  Cardio: Denies chest pain, palpitations, irregular heartbeat, syncope, dyspnea, diaphoresis,  orthopnea, PND, claudication or edema Respiratory: denies cough, dyspnea, DOE, pleurisy, hoarseness, laryngitis or wheezing.  Gastrointestinal: Denies dysphagia, heartburn, reflux, water brash, pain, cramps, nausea, vomiting, bloating, diarrhea, constipation, hematemesis, melena, hematochezia, jaundice or hemorrhoids Genitourinary: Denies dysuria, frequency, urgency, nocturia, hesitancy, discharge, hematuria or flank pain Musculoskeletal: Has had arthralgias of the L knee, but otherwise denies  myalgias, stiffness, limp or strain/sprain. Denies Falls. Skin: Denies puritis, rash, hives, warts, acne, eczema or change in skin lesion Neuro: No weakness, tremor, incoordination, spasms, paresthesia or pain Psychiatric: Denies confusion, memory loss or sensory loss. Denies Depression. Endocrine: Denies change in weight, skin, hair change, nocturia, and paresthesia, diabetic polys, visual blurring or hyper / hypo glycemic episodes.  Heme/Lymph: No excessive bleeding, bruising or enlarged lymph nodes.  Physical Exam  BP 128/88 mmHg  Pulse 92  Temp(Src) 97.5 F (36.4 C)  Resp 16  Ht 6\' 1"  (1.854 m)  Wt 226 lb 6.4 oz (102.694 kg)  BMI 29.88 kg/m2  General Appearance: Well nourished, in no apparent distress. Eyes: PERRLA, EOMs, conjunctiva no swelling or erythema, normal fundi and vessels. Sinuses: No frontal/maxillary tenderness ENT/Mouth: EACs patent / TMs  nl. Nares clear without erythema, swelling, mucoid exudates. Oral hygiene is good. No erythema, swelling, or exudate. Tongue normal, non-obstructing. Tonsils not swollen or erythematous. Hearing normal.  Neck: Supple, thyroid normal. No bruits, nodes or JVD. Respiratory: Respiratory effort normal.  BS equal and clear bilateral without rales, rhonci, wheezing or stridor. Cardio: Heart sounds are normal with regular rate and rhythm and no murmurs, rubs or gallops. Peripheral pulses are normal and equal bilaterally without edema. No aortic or  femoral bruits. Chest: symmetric with normal excursions and percussion.  Abdomen: Soft, with Nl bowel sounds. Nontender, no guarding, rebound, hernias, masses, or organomegaly.  Lymphatics: Non tender without lymphadenopathy.  Genitourinary: No hernias.Testes nl. DRE - prostate nl for age - smooth & firm w/o nodules. Musculoskeletal: Full ROM all peripheral extremities, joint stability, 5/5 strength, and normal gait. Skin: Warm and dry without rashes, lesions, cyanosis, clubbing or  ecchymosis.  Neuro: Cranial nerves intact, reflexes equal bilaterally. Normal muscle tone, no cerebellar symptoms. Sensation intact.  Pysch: Alert and oriented X 3 with normal affect, insight and judgment appropriate.   Assessment and Plan  1. Annual Preventative/Screening Exam   - Microalbumin / creatinine urine ratio - EKG 12-Lead - [place], RETROPERITNL ABD,  LTD - POC Hemoccult Bld/Stl  - Urinalysis, Routine w reflex microscopic - Vitamin B12 - Iron and TIBC - PSA - Testosterone - CBC with Differential/Platelet - BASIC METABOLIC PANEL WITH GFR - Hepatic function panel - Magnesium - Lipid panel - TSH - Hemoglobin A1c - Insulin, random - VITAMIN D 25 Hydroxy  - Pneumococcal conjugate vaccine 13-valent  2. Labile hypertension  - Microalbumin / creatinine urine ratio - EKG 12-Lead - [place], RETROPERITNL  ABD,  LTD - TSH  3. Hyperlipidemia  - Lipid panel - TSH  4. Prediabetes  - Hemoglobin A1c - Insulin, random  5. Vitamin D deficiency  - VITAMIN D 25 Hydroxy  6. Testosterone deficiency  - Testosterone  7. Screening for rectal cancer  - POC Hemoccult Bld/Stl   8. Prostate cancer screening  - PSA  9. Other fatigue  - Vitamin B12 - Iron and TIBC - Testosterone - CBC with Differential/Platelet - TSH  10. Screening for AAA (aortic abdominal aneurysm)   11. Screening for ischemic heart disease   12. Medication management  - Urinalysis, Routine w reflex microscopic  - CBC  with Differential/Platelet - BASIC METABOLIC PANEL WITH GFR - Hepatic function panel - Magnesium  13. Need for prophylactic vaccination against Streptococcus pneumoniae (pneumococcus)  - Pneumococcal conjugate vaccine 13-valent today - Return in 8 weeks for PNVX /23  Continue prudent diet as discussed, weight control, BP monitoring, regular exercise, and medications as discussed.  Discussed med effects and SE's. Routine screening labs and tests as requested with regular follow-up as recommended. Over 40 minutes of exam, counseling, chart review and high complex critical decision making was performed

## 2015-10-25 NOTE — Patient Instructions (Addendum)

## 2015-10-26 ENCOUNTER — Other Ambulatory Visit: Payer: Self-pay | Admitting: Internal Medicine

## 2015-10-26 LAB — MICROALBUMIN / CREATININE URINE RATIO
CREATININE, URINE: 203 mg/dL (ref 20–370)
MICROALB UR: 0.5 mg/dL
Microalb Creat Ratio: 2 mcg/mg creat (ref <30)

## 2015-10-26 LAB — INSULIN, RANDOM: INSULIN: 14.9 u[IU]/mL (ref 2.0–19.6)

## 2015-10-26 LAB — URINALYSIS, ROUTINE W REFLEX MICROSCOPIC
BILIRUBIN URINE: NEGATIVE
Glucose, UA: NEGATIVE
Hgb urine dipstick: NEGATIVE
KETONES UR: NEGATIVE
Leukocytes, UA: NEGATIVE
NITRITE: NEGATIVE
PROTEIN: NEGATIVE
Specific Gravity, Urine: 1.018 (ref 1.001–1.035)
pH: 6 (ref 5.0–8.0)

## 2015-10-26 LAB — HEMOGLOBIN A1C
HEMOGLOBIN A1C: 5.5 % (ref <5.7)
MEAN PLASMA GLUCOSE: 111 mg/dL

## 2015-10-26 LAB — VITAMIN B12: Vitamin B-12: 489 pg/mL (ref 200–1100)

## 2015-10-26 LAB — TESTOSTERONE: Testosterone: 347 ng/dL (ref 250–827)

## 2015-10-26 LAB — TSH: TSH: 2.01 mIU/L (ref 0.40–4.50)

## 2015-10-26 LAB — PSA: PSA: 0.71 ng/mL (ref <=4.00)

## 2015-10-26 LAB — VITAMIN D 25 HYDROXY (VIT D DEFICIENCY, FRACTURES): Vit D, 25-Hydroxy: 53 ng/mL (ref 30–100)

## 2015-12-25 ENCOUNTER — Ambulatory Visit (INDEPENDENT_AMBULATORY_CARE_PROVIDER_SITE_OTHER): Payer: BLUE CROSS/BLUE SHIELD | Admitting: *Deleted

## 2015-12-25 DIAGNOSIS — Z23 Encounter for immunization: Secondary | ICD-10-CM

## 2016-03-20 ENCOUNTER — Other Ambulatory Visit: Payer: Self-pay | Admitting: *Deleted

## 2016-03-20 MED ORDER — AZITHROMYCIN 250 MG PO TABS: ORAL_TABLET | ORAL | 0 refills | Status: AC

## 2016-03-20 NOTE — Telephone Encounter (Signed)
A message was left to inform the patient's spouse that an RX for a Z-pak has been sent in and Dr Melford Aase suggested the patient take an OTC decongestant.

## 2016-04-23 ENCOUNTER — Ambulatory Visit (INDEPENDENT_AMBULATORY_CARE_PROVIDER_SITE_OTHER): Payer: BLUE CROSS/BLUE SHIELD | Admitting: Internal Medicine

## 2016-04-23 ENCOUNTER — Encounter: Payer: Self-pay | Admitting: Internal Medicine

## 2016-04-23 VITALS — BP 126/80 | HR 84 | Temp 98.0°F | Resp 18 | Ht 73.0 in | Wt 230.0 lb

## 2016-04-23 DIAGNOSIS — Z23 Encounter for immunization: Secondary | ICD-10-CM | POA: Diagnosis not present

## 2016-04-23 DIAGNOSIS — R7303 Prediabetes: Secondary | ICD-10-CM | POA: Diagnosis not present

## 2016-04-23 DIAGNOSIS — E782 Mixed hyperlipidemia: Secondary | ICD-10-CM | POA: Diagnosis not present

## 2016-04-23 DIAGNOSIS — Z79899 Other long term (current) drug therapy: Secondary | ICD-10-CM | POA: Diagnosis not present

## 2016-04-23 DIAGNOSIS — Z9849 Cataract extraction status, unspecified eye: Secondary | ICD-10-CM

## 2016-04-23 DIAGNOSIS — E559 Vitamin D deficiency, unspecified: Secondary | ICD-10-CM

## 2016-04-23 DIAGNOSIS — I10 Essential (primary) hypertension: Secondary | ICD-10-CM | POA: Diagnosis not present

## 2016-04-23 DIAGNOSIS — E291 Testicular hypofunction: Secondary | ICD-10-CM | POA: Diagnosis not present

## 2016-04-23 DIAGNOSIS — R0989 Other specified symptoms and signs involving the circulatory and respiratory systems: Secondary | ICD-10-CM

## 2016-04-23 NOTE — Progress Notes (Signed)
Assessment and Plan:  Hypertension:  -Continue medication,  -monitor blood pressure at home.  -Continue DASH diet.   -Reminder to go to the ER if any CP, SOB, nausea, dizziness, severe HA, changes vision/speech, left arm numbness and tingling, and jaw pain.  Cholesterol: -Continue diet and exercise.     Pre-diabetes: -Continue diet and exercise.   Vitamin D Def: -continue medications.   Continue diet and meds as discussed. Further disposition pending results of labs.  HPI 65 y.o. male  presents for 3 month follow up with hypertension, hyperlipidemia, prediabetes and vitamin D.   His blood pressure has been controlled at home, today their BP is BP: 126/80.   He does workout. He denies chest pain, shortness of breath, dizziness.   He is on cholesterol medication and denies myalgias. His cholesterol is at goal. The cholesterol last visit was:   Lab Results  Component Value Date   CHOL 164 10/25/2015   HDL 38 (L) 10/25/2015   LDLCALC 72 10/25/2015   TRIG 268 (H) 10/25/2015   CHOLHDL 4.3 10/25/2015     He has been working on diet and exercise for prediabetes, and denies foot ulcerations, hyperglycemia, hypoglycemia , increased appetite, nausea, paresthesia of the feet, polydipsia, polyuria, visual disturbances, vomiting and weight loss. Last A1C in the office was:  Lab Results  Component Value Date   HGBA1C 5.5 10/25/2015    Patient is on Vitamin D supplement.  Lab Results  Component Value Date   VD25OH 53 10/25/2015     Dr. Cristy Friedlander did his cataract surgery.  He reports that he recovered well and he is not having any issues.  He reports that he had a very positive experience.     Current Medications:  Current Outpatient Prescriptions on File Prior to Visit  Medication Sig Dispense Refill  . BABY ASPIRIN PO Take 81 mg by mouth 2 (two) times daily.     . Cholecalciferol (VITAMIN D PO) Take 10,000 Int'l Units by mouth daily.    . citalopram (CELEXA) 40 MG tablet TAKE ONE  TABLET BY MOUTH ONE TIME DAILY 90 tablet 1   No current facility-administered medications on file prior to visit.     Medical History:  Past Medical History:  Diagnosis Date  . Hyperlipidemia   . Labile hypertension   . Other testicular hypofunction   . Prediabetes   . Vitamin D deficiency     Allergies:  Allergies  Allergen Reactions  . Lipitor [Atorvastatin]   . Lopid [Gemfibrozil]   . Penicillins      Review of Systems:  Review of Systems  Constitutional: Negative for chills, fever and malaise/fatigue.  HENT: Negative for congestion, ear pain and sore throat.   Eyes: Negative.   Respiratory: Negative for cough, shortness of breath and wheezing.   Cardiovascular: Negative for chest pain, palpitations and leg swelling.  Gastrointestinal: Negative for abdominal pain, blood in stool, constipation, diarrhea, heartburn and melena.  Genitourinary: Negative.   Skin: Negative.   Neurological: Negative for dizziness, sensory change, loss of consciousness and headaches.  Psychiatric/Behavioral: Negative for depression. The patient is not nervous/anxious and does not have insomnia.     Family history- Review and unchanged  Social history- Review and unchanged  Physical Exam: BP 126/80   Pulse 84   Temp 98 F (36.7 C) (Temporal)   Resp 18   Ht 6\' 1"  (1.854 m)   Wt 230 lb (104.3 kg)   BMI 30.34 kg/m  Wt Readings from Last 3  Encounters:  04/23/16 230 lb (104.3 kg)  10/25/15 226 lb 6.4 oz (102.7 kg)  04/15/15 229 lb (103.9 kg)    General Appearance: Well nourished well developed, in no apparent distress. Eyes: PERRLA, EOMs, conjunctiva no swelling or erythema ENT/Mouth: Ear canals normal without obstruction, swelling, erythma, discharge.  TMs normal bilaterally.  Oropharynx moist, clear, without exudate, or postoropharyngeal swelling. Neck: Supple, thyroid normal,no cervical adenopathy  Respiratory: Respiratory effort normal, Breath sounds clear A&P without rhonchi,  wheeze, or rale.  No retractions, no accessory usage. Cardio: RRR with no MRGs. Brisk peripheral pulses without edema.  Abdomen: Soft, + BS,  Non tender, no guarding, rebound, hernias, masses. Musculoskeletal: Full ROM, 5/5 strength, Normal gait Skin: Warm, dry without rashes, lesions, ecchymosis.  Neuro: Awake and oriented X 3, Cranial nerves intact. Normal muscle tone, no cerebellar symptoms. Psych: Normal affect, Insight and Judgment appropriate.    Starlyn Skeans, PA-C 11:11 AM [facility] Adult & Adolescent Internal Medicine

## 2016-08-22 ENCOUNTER — Other Ambulatory Visit: Payer: Self-pay | Admitting: Internal Medicine

## 2016-11-25 ENCOUNTER — Encounter: Payer: Self-pay | Admitting: Internal Medicine

## 2016-11-25 ENCOUNTER — Ambulatory Visit (INDEPENDENT_AMBULATORY_CARE_PROVIDER_SITE_OTHER): Payer: Medicare Other | Admitting: Internal Medicine

## 2016-11-25 VITALS — BP 116/82 | HR 80 | Temp 97.8°F | Resp 16 | Ht 73.5 in | Wt 226.8 lb

## 2016-11-25 DIAGNOSIS — E559 Vitamin D deficiency, unspecified: Secondary | ICD-10-CM

## 2016-11-25 DIAGNOSIS — Z79899 Other long term (current) drug therapy: Secondary | ICD-10-CM

## 2016-11-25 DIAGNOSIS — Z125 Encounter for screening for malignant neoplasm of prostate: Secondary | ICD-10-CM

## 2016-11-25 DIAGNOSIS — I10 Essential (primary) hypertension: Secondary | ICD-10-CM

## 2016-11-25 DIAGNOSIS — Z0001 Encounter for general adult medical examination with abnormal findings: Secondary | ICD-10-CM

## 2016-11-25 DIAGNOSIS — R7303 Prediabetes: Secondary | ICD-10-CM

## 2016-11-25 DIAGNOSIS — E782 Mixed hyperlipidemia: Secondary | ICD-10-CM

## 2016-11-25 DIAGNOSIS — Z1212 Encounter for screening for malignant neoplasm of rectum: Secondary | ICD-10-CM

## 2016-11-25 DIAGNOSIS — R0989 Other specified symptoms and signs involving the circulatory and respiratory systems: Secondary | ICD-10-CM

## 2016-11-25 DIAGNOSIS — Z136 Encounter for screening for cardiovascular disorders: Secondary | ICD-10-CM

## 2016-11-25 DIAGNOSIS — N320 Bladder-neck obstruction: Secondary | ICD-10-CM

## 2016-11-25 LAB — HEPATIC FUNCTION PANEL
ALK PHOS: 70 U/L (ref 40–115)
ALT: 26 U/L (ref 9–46)
AST: 20 U/L (ref 10–35)
Albumin: 4.2 g/dL (ref 3.6–5.1)
BILIRUBIN DIRECT: 0.2 mg/dL (ref <=0.2)
BILIRUBIN INDIRECT: 0.6 mg/dL (ref 0.2–1.2)
Total Bilirubin: 0.8 mg/dL (ref 0.2–1.2)
Total Protein: 6.8 g/dL (ref 6.1–8.1)

## 2016-11-25 LAB — URINALYSIS, ROUTINE W REFLEX MICROSCOPIC
Bilirubin Urine: NEGATIVE
Glucose, UA: NEGATIVE
Hgb urine dipstick: NEGATIVE
Ketones, ur: NEGATIVE
Leukocytes, UA: NEGATIVE
Nitrite: NEGATIVE
Protein, ur: NEGATIVE
Specific Gravity, Urine: 1.022 (ref 1.001–1.035)
pH: 5 (ref 5.0–8.0)

## 2016-11-25 LAB — LIPID PANEL
CHOL/HDL RATIO: 4.2 ratio (ref <5.0)
CHOLESTEROL: 166 mg/dL (ref <200)
HDL: 40 mg/dL — ABNORMAL LOW (ref >40)
LDL CALC: 95 mg/dL (ref <100)
TRIGLYCERIDES: 153 mg/dL — AB (ref <150)
VLDL: 31 mg/dL — AB (ref <30)

## 2016-11-25 LAB — CBC WITH DIFFERENTIAL/PLATELET
BASOS PCT: 1 %
Basophils Absolute: 58 cells/uL (ref 0–200)
Eosinophils Absolute: 174 cells/uL (ref 15–500)
Eosinophils Relative: 3 %
HCT: 47.8 % (ref 38.5–50.0)
HEMOGLOBIN: 16.2 g/dL (ref 13.2–17.1)
LYMPHS ABS: 1914 {cells}/uL (ref 850–3900)
Lymphocytes Relative: 33 %
MCH: 34.9 pg — ABNORMAL HIGH (ref 27.0–33.0)
MCHC: 33.9 g/dL (ref 32.0–36.0)
MCV: 103 fL — ABNORMAL HIGH (ref 80.0–100.0)
MPV: 9.2 fL (ref 7.5–12.5)
Monocytes Absolute: 464 cells/uL (ref 200–950)
Monocytes Relative: 8 %
NEUTROS ABS: 3190 {cells}/uL (ref 1500–7800)
NEUTROS PCT: 55 %
Platelets: 265 10*3/uL (ref 140–400)
RBC: 4.64 MIL/uL (ref 4.20–5.80)
RDW: 13.1 % (ref 11.0–15.0)
WBC: 5.8 10*3/uL (ref 3.8–10.8)

## 2016-11-25 LAB — BASIC METABOLIC PANEL WITH GFR
BUN: 11 mg/dL (ref 7–25)
CALCIUM: 9.3 mg/dL (ref 8.6–10.3)
CO2: 25 mmol/L (ref 20–31)
Chloride: 104 mmol/L (ref 98–110)
Creat: 1.11 mg/dL (ref 0.70–1.25)
GFR, EST NON AFRICAN AMERICAN: 69 mL/min (ref >=60)
GFR, Est African American: 80 mL/min (ref >=60)
GLUCOSE: 115 mg/dL — AB (ref 65–99)
POTASSIUM: 4.5 mmol/L (ref 3.5–5.3)
SODIUM: 138 mmol/L (ref 135–146)

## 2016-11-25 LAB — MICROALBUMIN / CREATININE URINE RATIO
CREATININE, URINE: 290 mg/dL (ref 20–370)
Microalb Creat Ratio: 3 mcg/mg creat (ref <30)
Microalb, Ur: 1 mg/dL

## 2016-11-25 LAB — TSH: TSH: 2.89 mIU/L (ref 0.40–4.50)

## 2016-11-25 LAB — PSA: PSA: 1.2 ng/mL (ref <=4.0)

## 2016-11-25 LAB — MAGNESIUM: Magnesium: 2 mg/dL (ref 1.5–2.5)

## 2016-11-25 NOTE — Patient Instructions (Signed)

## 2016-11-25 NOTE — Progress Notes (Signed)
[facility] ADULT & ADOLESCENT INTERNAL MEDICINE   Unk Pinto, M.D.      Uvaldo Bristle. Silverio Lay, P.A.-C [facility]                [address] [place], [place] [address] Telephone [phone] Telefax [phone] Annual  Screening/Preventative Visit  & Comprehensive Evaluation & Examination     This very nice 66 y.o. MWM presents for a Screening/Preventative Visit & comprehensive evaluation and management of multiple medical co-morbidities.  Patient has been followed for HTN, Prediabetes, Hyperlipidemia and Vitamin D Deficiency.     Labile HTN  followed expectantly  predates since 2002. Patient's BP has been controlled at home.  Today's BP is at goal -116/82. Patient denies any cardiac symptoms as chest pain, palpitations, shortness of breath, dizziness or ankle swelling.     Patient's hyperlipidemia is controlled with diet and medications. Patient denies myalgias or other medication SE's. Last lipids were at goal albeit elevated Trig's: Lab Results  Component Value Date   CHOL 164 10/25/2015   HDL 38 (L) 10/25/2015   LDLCALC 72 10/25/2015   TRIG 268 (H) 10/25/2015   CHOLHDL 4.3 10/25/2015      Patient has prediabetes (A1c 5.8% in 2013 & 5.6% in 2015) and patient denies reactive hypoglycemic symptoms, visual blurring, diabetic polys or paresthesias. Last A1c was at goal: Lab Results  Component Value Date   HGBA1C 5.5 10/25/2015       Finally, patient has history of Vitamin D Deficiency ("27" in 2008) and last vitamin D was still sl low (goal 70-100): Lab Results  Component Value Date   VD25OH 53 10/25/2015   Current Outpatient Prescriptions on File Prior to Visit  Medication Sig  . BABY ASPIRIN PO Take 81 mg by mouth 2 (two) times daily.   . Cholecalciferol (VITAMIN D PO) Take 10,000 Int'l Units by mouth daily.  . citalopram (CELEXA) 40 MG tablet TAKE ONE TABLET BY MOUTH ONE TIME DAILY   No current facility-administered  medications on file prior to visit.    Allergies  Allergen Reactions  . Lipitor [Atorvastatin]   . Lopid [Gemfibrozil]   . Penicillins    Past Medical History:  Diagnosis Date  . Hyperlipidemia   . Labile hypertension   . Other testicular hypofunction   . Prediabetes   . Vitamin D deficiency    Health Maintenance  Topic Date Due  . COLONOSCOPY  05/18/2000  . INFLUENZA VACCINE  01/20/2017  . TETANUS/TDAP  09/16/2021  . PNA vac Low Risk Adult  Completed  . Hepatitis C Screening  Excluded   Immunization History  Administered Date(s) Administered  . Influenza Split 04/13/2013, 04/06/2014, 04/15/2015  . Influenza, High Dose Seasonal PF 04/23/2016  . Pneumococcal Conjugate-13 10/25/2015  . Pneumococcal Polysaccharide-23 12/25/2015  . Pneumococcal-Unspecified 06/22/2002  . Tdap 09/17/2011   Past Surgical History:  Procedure Laterality Date  . KNEE ARTHROSCOPY Left 1988   Family History  Problem Relation Age of Onset  . Diabetes Mother   . Alzheimer's disease Mother   . Heart disease Father   . Hypertension Father   . Stroke Father   . Colon polyps Father    Social History   Social History  . Marital status: Married    Spouse name: N/A  . Number of children: N/A  . Years of education: N/A   Occupational History  . Retired [employer]  Social History Main Topics  . Smoking status: Former Smoker    Quit date: 10/10/2004  . Smokeless tobacco: Not on file  . Alcohol use 0.0 oz/week     Comment: Rare  . Drug use: No  . Sexual activity: Not on file    ROS Constitutional: Denies fever, chills, weight loss/gain, headaches, insomnia,  night sweats or change in appetite. Does c/o fatigue. Eyes: Denies redness, blurred vision, diplopia, discharge, itchy or watery eyes.  ENT: Denies discharge, congestion, post nasal drip, epistaxis, sore throat, earache, hearing loss, dental pain, Tinnitus, Vertigo, Sinus pain or snoring.  Cardio: Denies chest pain,  palpitations, irregular heartbeat, syncope, dyspnea, diaphoresis, orthopnea, PND, claudication or edema Respiratory: denies cough, dyspnea, DOE, pleurisy, hoarseness, laryngitis or wheezing.  Gastrointestinal: Denies dysphagia, heartburn, reflux, water brash, pain, cramps, nausea, vomiting, bloating, diarrhea, constipation, hematemesis, melena, hematochezia, jaundice or hemorrhoids Genitourinary: Denies dysuria, frequency, urgency, nocturia, hesitancy, discharge, hematuria or flank pain Musculoskeletal: Denies arthralgia, myalgia, stiffness, Jt. Swelling, pain, limp or strain/sprain. Denies Falls. Skin: Denies puritis, rash, hives, warts, acne, eczema or change in skin lesion Neuro: No weakness, tremor, incoordination, spasms, paresthesia or pain Psychiatric: Denies confusion, memory loss or sensory loss. Denies Depression. Endocrine: Denies change in weight, skin, hair change, nocturia, and paresthesia, diabetic polys, visual blurring or hyper / hypo glycemic episodes.  Heme/Lymph: No excessive bleeding, bruising or enlarged lymph nodes.  Physical Exam  BP 116/82   Pulse 80   Temp 97.8 F (36.6 C)   Resp 16   Ht 6' 1.5" (1.867 m)   Wt 226 lb 12.8 oz (102.9 kg)   BMI 29.52 kg/m   General Appearance: Well nourished and well groomed and in no apparent distress.  Eyes: PERRLA, EOMs, conjunctiva no swelling or erythema, normal fundi and vessels. Sinuses: No frontal/maxillary tenderness ENT/Mouth: EACs patent / TMs  nl. Nares clear without erythema, swelling, mucoid exudates. Oral hygiene is good. No erythema, swelling, or exudate. Tongue normal, non-obstructing. Tonsils not swollen or erythematous. Hearing normal.  Neck: Supple, thyroid normal. No bruits, nodes or JVD. Respiratory: Respiratory effort normal.  BS equal and clear bilateral without rales, rhonci, wheezing or stridor. Cardio: Heart sounds are normal with regular rate and rhythm and no murmurs, rubs or gallops. Peripheral pulses  are normal and equal bilaterally without edema. No aortic or femoral bruits. Chest: symmetric with normal excursions and percussion.  Abdomen: Soft, with Nl bowel sounds. Nontender, no guarding, rebound, hernias, masses, or organomegaly.  Lymphatics: Non tender without lymphadenopathy.  Genitourinary: No hernias.Testes nl. DRE - prostate nl for age - smooth & firm w/o nodules. Musculoskeletal: Full ROM all peripheral extremities, joint stability, 5/5 strength, and normal gait. Skin: Warm and dry without rashes, lesions, cyanosis, clubbing or  ecchymosis.  Neuro: Cranial nerves intact, reflexes equal bilaterally. Normal muscle tone, no cerebellar symptoms. Sensation intact.  Pysch: Alert and oriented X 3 with normal affect, insight and judgment appropriate.   Assessment and Plan  1. Annual Preventative/Screening Exam   2. Labile hypertension  - EKG 12-Lead - [place], RETROPERITNL ABD,  LTD - Urinalysis, Routine w reflex microscopic - Microalbumin / creatinine urine ratio - CBC with Differential/Platelet - BASIC METABOLIC PANEL WITH GFR - Magnesium - TSH  3. Hyperlipidemia, mixed  - EKG 12-Lead - [place], RETROPERITNL ABD,  LTD - Hepatic function panel - Lipid panel - TSH  4. Prediabetes  - EKG 12-Lead - [place], RETROPERITNL ABD,  LTD - Hemoglobin A1c - Insulin, random  5. Vitamin D deficiency  -  VITAMIN D 25 Hydroxy   6. Screening for rectal cancer  - POC Hemoccult Bld/Stl  7. Prostate cancer screening  - PSA  8. Screening for ischem  - [place], RETROPERITNL ABD,  LTD - CBC with Differential/Platelet  10. Bladder outflow obstruction  - PSA  11. Medication management  - Urinalysis, Routine w reflex microscopic - Microalbumin / creatinine urine ratio - CBC with Differential/Platelet - BASIC METABOLIC PANEL WITH GFR       Patient was counseled in prudent diet, weight control to achieve/maintain BMI less than 25, BP monitoring, regular exercise and medications as  discussed.  Discussed med effects and SE's. Routine screening labs and tests as requested with regular follow-up as recommended. Over 40 minutes of exam, counseling, chart review and high complex critical decision making was performed

## 2016-11-26 LAB — HEMOGLOBIN A1C
HEMOGLOBIN A1C: 5.3 % (ref <5.7)
Mean Plasma Glucose: 105 mg/dL

## 2016-11-26 LAB — INSULIN, RANDOM: Insulin: 17.2 u[IU]/mL (ref 2.0–19.6)

## 2016-11-26 LAB — VITAMIN D 25 HYDROXY (VIT D DEFICIENCY, FRACTURES): Vit D, 25-Hydroxy: 74 ng/mL (ref 30–100)

## 2017-02-16 ENCOUNTER — Other Ambulatory Visit: Payer: Self-pay | Admitting: Internal Medicine

## 2017-05-27 DIAGNOSIS — L821 Other seborrheic keratosis: Secondary | ICD-10-CM | POA: Diagnosis not present

## 2017-05-27 DIAGNOSIS — Z85828 Personal history of other malignant neoplasm of skin: Secondary | ICD-10-CM | POA: Diagnosis not present

## 2017-05-27 DIAGNOSIS — D1801 Hemangioma of skin and subcutaneous tissue: Secondary | ICD-10-CM | POA: Diagnosis not present

## 2017-05-27 DIAGNOSIS — L723 Sebaceous cyst: Secondary | ICD-10-CM | POA: Diagnosis not present

## 2017-06-22 HISTORY — PX: CATARACT EXTRACTION, BILATERAL: SHX1313

## 2017-08-14 ENCOUNTER — Other Ambulatory Visit: Payer: Self-pay | Admitting: Internal Medicine

## 2017-11-10 ENCOUNTER — Other Ambulatory Visit: Payer: Self-pay | Admitting: Internal Medicine

## 2017-12-06 ENCOUNTER — Encounter: Payer: Self-pay | Admitting: Internal Medicine

## 2017-12-06 NOTE — Progress Notes (Signed)
[facility]   Eric [name], M.D.     Eric Bristle. Silverio Lay, P.A.-C Liane Comber, [place]                [address] [place] [address] Telephone [phone] Telefax [phone] Annual  Screening/Preventative Visit  & Comprehensive Evaluation & Examination     This very nice 67 y.o. MWM presents for a Screening /Preventative Visit & comprehensive evaluation and management of multiple medical co-morbidities.  Patient has been followed for HTN, HLD, Prediabetes and Vitamin D Deficiency.     Patient has been followed expectantly since 2002 for labile HTN.  Patient's BP has been controlled at home.  Today's BP is at goal - 114/82. Patient denies any cardiac symptoms as chest pain, palpitations, shortness of breath, dizziness or ankle swelling.     Patient's hyperlipidemia is controlled in the past with diet and medications, but he's not been to the office in 1 year since June 2018. Patient denies myalgias or other medication SE's. Last lipids were at goal: Lab Results  Component Value Date   CHOL 166 11/25/2016   HDL 40 (L) 11/25/2016   LDLCALC 95 11/25/2016   TRIG 153 (H) 11/25/2016   CHOLHDL 4.2 11/25/2016      Patient has prediabetes (A1c 5.8% /2013 & 5.6% /2015)  and patient denies reactive hypoglycemic symptoms, visual blurring, diabetic polys or paresthesias. Last A1c was Normal & at goal: Lab Results  Component Value Date   HGBA1C 5.3 11/25/2016       Finally, patient has history of Vitamin D Deficiency ("27" /2008)  and last vitamin D was at goal: Lab Results  Component Value Date   VD25OH 74 11/25/2016   Current Outpatient Medications on File Prior to Visit  Medication Sig  . BABY ASPIRIN PO Take 81 mg by mouth 2 (two) times daily.   . Cholecalciferol (VITAMIN D PO) Take 10,000 Int'l Units by mouth daily.  . citalopram (CELEXA) 40 MG tablet TAKE ONE TABLET BY MOUTH ONE  TIME DAILY   No current facility-administered medications on file prior to visit.    Allergies  Allergen Reactions  . Lipitor [Atorvastatin]   . Lopid [Gemfibrozil]   . Penicillins    Past Medical History:  Diagnosis Date  . Hyperlipidemia   . Labile hypertension   . Other testicular hypofunction   . Prediabetes   . Vitamin D deficiency    Health Maintenance  Topic Date Due  . COLONOSCOPY  05/18/2000  . INFLUENZA VACCINE  01/20/2018  . TETANUS/TDAP  09/16/2021  . PNA vac Low Risk Adult  Completed  . Hepatitis C Screening  Discontinued   Immunization History  Administered Date(s) Administered  . Influenza Split 04/13/2013, 04/06/2014, 04/15/2015  . Influenza, High Dose Seasonal PF 04/23/2016  . Pneumococcal Conjugate-13 10/25/2015  . Pneumococcal Polysaccharide-23 12/25/2015  . Pneumococcal-Unspecified 06/22/2002  . Tdap 09/17/2011   Last Colon - 2006 - Dr Earlean Shawl - Neg for polyps - recc 10 year f/u  Past Surgical History:  Procedure Laterality Date  . KNEE ARTHROSCOPY Left 1988   Family History  Problem Relation Age of Onset  . Diabetes Mother   . Alzheimer's disease Mother   . Heart disease Father   . Hypertension Father   . Stroke Father   . Colon polyps Father    Social History   Socioeconomic  History  . Marital status: Married    Spouse name: [name]  . Number of children: 1  Occupational History  . Retired Paediatric nurse  Tobacco Use  . Smoking status: Former Smoker    Last attempt to quit: 10/10/2004    Years since quitting: 13.1  . Smokeless tobacco: Never Used  Substance and Sexual Activity  . Alcohol use: Yes, Comment: Rare    Alcohol/week: 0.0 oz  . Drug use: No  . Sexual activity: Not on file    ROS Constitutional: Denies fever, chills, weight loss/gain, headaches, insomnia,  night sweats or change in appetite. Does c/o fatigue. Eyes: Denies redness, blurred vision, diplopia, discharge, itchy or watery eyes.  ENT: Denies discharge,  congestion, post nasal drip, epistaxis, sore throat, earache, hearing loss, dental pain, Tinnitus, Vertigo, Sinus pain or snoring.  Cardio: Denies chest pain, palpitations, irregular heartbeat, syncope, dyspnea, diaphoresis, orthopnea, PND, claudication or edema Respiratory: denies cough, dyspnea, DOE, pleurisy, hoarseness, laryngitis or wheezing.  Gastrointestinal: Denies dysphagia, heartburn, reflux, water brash, pain, cramps, nausea, vomiting, bloating, diarrhea, constipation, hematemesis, melena, hematochezia, jaundice or hemorrhoids Genitourinary: Denies dysuria, frequency, urgency, nocturia, hesitancy, discharge, hematuria or flank pain Musculoskeletal: Denies arthralgia, myalgia, stiffness, Jt. Swelling, pain, limp or strain/sprain. Denies Falls. Skin: Denies puritis, rash, hives, warts, acne, eczema or change in skin lesion Neuro: No weakness, tremor, incoordination, spasms, paresthesia or pain Psychiatric: Denies confusion, memory loss or sensory loss. Denies Depression. Endocrine: Denies change in weight, skin, hair change, nocturia, and paresthesia, diabetic polys, visual blurring or hyper / hypo glycemic episodes.  Heme/Lymph: No excessive bleeding, bruising or enlarged lymph nodes.  Physical Exam  BP 114/82   Pulse 76   Temp (!) 97.2 F (36.2 C)   Resp 16   Ht 6\' 1"  (1.854 m)   Wt 219 lb 12.8 oz (99.7 kg)   BMI 29.00 kg/m   General Appearance: Well nourished and well groomed and in no apparent distress.  Eyes: PERRLA, EOMs, conjunctiva no swelling or erythema, normal fundi and vessels. Sinuses: No frontal/maxillary tenderness ENT/Mouth: EACs patent / TMs  nl. Nares clear without erythema, swelling, mucoid exudates. Oral hygiene is good. No erythema, swelling, or exudate. Tongue normal, non-obstructing. Tonsils not swollen or erythematous. Hearing normal.  Neck: Supple, thyroid not palpable. No bruits, nodes or JVD. Respiratory: Respiratory effort normal.  BS equal and clear  bilateral without rales, rhonci, wheezing or stridor. Cardio: Heart sounds are normal with regular rate and rhythm and no murmurs, rubs or gallops. Peripheral pulses are normal and equal bilaterally without edema. No aortic or femoral bruits. Chest: symmetric with normal excursions and percussion.  Abdomen: Soft, with Nl bowel sounds. Nontender, no guarding, rebound, hernias, masses, or organomegaly.  Lymphatics: Non tender without lymphadenopathy.  Genitourinary: No hernias.Testes nl. DRE - prostate nl for age - smooth & firm w/o nodules. Musculoskeletal: Full ROM all peripheral extremities, joint stability, 5/5 strength, and normal gait. Skin: Warm and dry without rashes, lesions, cyanosis, clubbing or  ecchymosis.  Neuro: Cranial nerves intact, reflexes equal bilaterally. Normal muscle tone, no cerebellar symptoms. Sensation intact.  Pysch: Alert and oriented X 3 with normal affect, insight and judgment appropriate.   Assessment and Plan  1. Annual Preventative/Screening Exam   2. Labile hypertension  - EKG 12-Lead - [place], RETROPERITNL ABD,  LTD - Urinalysis, Routine w reflex microscopic - Microalbumin / creatinine urine ratio - CBC with Differential/Platelet - COMPLETE METABOLIC PANEL WITH GFR - Magnesium - TSH  3. Hyperlipidemia, mixed  -  EKG 12-Lead - [place], RETROPERITNL ABD,  LTD - Lipid panel - TSH  4. Abnormal glucose  - EKG 12-Lead - [place], RETROPERITNL ABD,  LTD - Hemoglobin A1c - Insulin, random  5. Vitamin D deficiency  - VITAMIN D 25 Hydroxyl  6. Prediabetes  - EKG 12-Lead - [place], RETROPERITNL ABD,  LTD - Hemoglobin A1c - Insulin, random  7. Screening for colorectal cancer  - Patient desires to do Cologard  8. BPH with obstruction/lower urinary tract symptoms  - PSA  9. Prostate cancer screening  - PSA  10. Screening for ischemic heart disease  - EKG 12-Lead  11. Former smoker  - EKG 12-Lead - [place], RETROPERITNL ABD,  LTD  12. FHx: heart  disease  - EKG 12-Lead  13. Screening for AAA (aortic abdominal aneurysm)  - [place], RETROPERITNL ABD,  LTD  14. Medication management  - Urinalysis, Routine w reflex microscopic - Microalbumin / creatinine urine ratio - CBC with Differential/Platelet - COMPLETE METABOLIC PANEL WITH GFR - Magnesium - Lipid panel - TSH - Hemoglobin A1c - Insulin, random - VITAMIN D 25 Hydroxyl      Patient was counseled in prudent diet, weight control to achieve/maintain BMI less than 25, BP monitoring, regular exercise and medications as discussed.  Discussed med effects and SE's. Routine screening labs and tests as requested with regular follow-up as recommended. Over 40 minutes of exam, counseling, chart review and high complex critical decision making was performed

## 2017-12-06 NOTE — Patient Instructions (Signed)

## 2017-12-07 ENCOUNTER — Ambulatory Visit (INDEPENDENT_AMBULATORY_CARE_PROVIDER_SITE_OTHER): Payer: Medicare Other | Admitting: Internal Medicine

## 2017-12-07 ENCOUNTER — Encounter: Payer: Self-pay | Admitting: Internal Medicine

## 2017-12-07 VITALS — BP 114/82 | HR 76 | Temp 97.2°F | Resp 16 | Ht 73.0 in | Wt 219.8 lb

## 2017-12-07 DIAGNOSIS — Z136 Encounter for screening for cardiovascular disorders: Secondary | ICD-10-CM

## 2017-12-07 DIAGNOSIS — Z87891 Personal history of nicotine dependence: Secondary | ICD-10-CM | POA: Diagnosis not present

## 2017-12-07 DIAGNOSIS — Z1211 Encounter for screening for malignant neoplasm of colon: Secondary | ICD-10-CM

## 2017-12-07 DIAGNOSIS — Z79899 Other long term (current) drug therapy: Secondary | ICD-10-CM | POA: Diagnosis not present

## 2017-12-07 DIAGNOSIS — N138 Other obstructive and reflux uropathy: Secondary | ICD-10-CM

## 2017-12-07 DIAGNOSIS — N401 Enlarged prostate with lower urinary tract symptoms: Secondary | ICD-10-CM | POA: Diagnosis not present

## 2017-12-07 DIAGNOSIS — Z8249 Family history of ischemic heart disease and other diseases of the circulatory system: Secondary | ICD-10-CM

## 2017-12-07 DIAGNOSIS — Z125 Encounter for screening for malignant neoplasm of prostate: Secondary | ICD-10-CM

## 2017-12-07 DIAGNOSIS — E782 Mixed hyperlipidemia: Secondary | ICD-10-CM | POA: Diagnosis not present

## 2017-12-07 DIAGNOSIS — Z0001 Encounter for general adult medical examination with abnormal findings: Secondary | ICD-10-CM

## 2017-12-07 DIAGNOSIS — R7303 Prediabetes: Secondary | ICD-10-CM

## 2017-12-07 DIAGNOSIS — R7309 Other abnormal glucose: Secondary | ICD-10-CM | POA: Diagnosis not present

## 2017-12-07 DIAGNOSIS — E559 Vitamin D deficiency, unspecified: Secondary | ICD-10-CM

## 2017-12-07 DIAGNOSIS — Z1212 Encounter for screening for malignant neoplasm of rectum: Secondary | ICD-10-CM

## 2017-12-07 DIAGNOSIS — R0989 Other specified symptoms and signs involving the circulatory and respiratory systems: Secondary | ICD-10-CM

## 2017-12-07 DIAGNOSIS — I10 Essential (primary) hypertension: Secondary | ICD-10-CM

## 2017-12-13 ENCOUNTER — Encounter: Payer: Self-pay | Admitting: *Deleted

## 2017-12-27 DIAGNOSIS — Z1212 Encounter for screening for malignant neoplasm of rectum: Secondary | ICD-10-CM | POA: Diagnosis not present

## 2017-12-27 DIAGNOSIS — Z1211 Encounter for screening for malignant neoplasm of colon: Secondary | ICD-10-CM | POA: Diagnosis not present

## 2017-12-27 LAB — COLOGUARD: Cologuard: NEGATIVE

## 2018-01-03 ENCOUNTER — Telehealth: Payer: Self-pay | Admitting: *Deleted

## 2018-01-03 ENCOUNTER — Encounter: Payer: Self-pay | Admitting: *Deleted

## 2018-01-03 NOTE — Telephone Encounter (Signed)
Patient advised of negative Cologuard.

## 2018-03-22 LAB — CBC WITH DIFFERENTIAL/PLATELET
BASOS ABS: 27 {cells}/uL (ref 0–200)
Basophils Relative: 0.4 %
EOS PCT: 1 %
Eosinophils Absolute: 68 cells/uL (ref 15–500)
HCT: 47.7 % (ref 38.5–50.0)
Hemoglobin: 16.8 g/dL (ref 13.2–17.1)
Lymphs Abs: 2285 cells/uL (ref 850–3900)
MCH: 34.2 pg — ABNORMAL HIGH (ref 27.0–33.0)
MCHC: 35.2 g/dL (ref 32.0–36.0)
MCV: 97.1 fL (ref 80.0–100.0)
MONOS PCT: 7.3 %
MPV: 9.5 fL (ref 7.5–12.5)
NEUTROS ABS: 3924 {cells}/uL (ref 1500–7800)
Neutrophils Relative %: 57.7 %
PLATELETS: 291 10*3/uL (ref 140–400)
RBC: 4.91 10*6/uL (ref 4.20–5.80)
RDW: 11.6 % (ref 11.0–15.0)
TOTAL LYMPHOCYTE: 33.6 %
WBC mixed population: 496 cells/uL (ref 200–950)
WBC: 6.8 10*3/uL (ref 3.8–10.8)

## 2018-03-22 LAB — COMPLETE METABOLIC PANEL WITH GFR
AG Ratio: 2 (calc) (ref 1.0–2.5)
ALBUMIN MSPROF: 4.7 g/dL (ref 3.6–5.1)
ALKALINE PHOSPHATASE (APISO): 71 U/L (ref 40–115)
ALT: 23 U/L (ref 9–46)
AST: 19 U/L (ref 10–35)
BILIRUBIN TOTAL: 0.8 mg/dL (ref 0.2–1.2)
BUN: 11 mg/dL (ref 7–25)
CHLORIDE: 102 mmol/L (ref 98–110)
CO2: 28 mmol/L (ref 20–32)
CREATININE: 1.14 mg/dL (ref 0.70–1.25)
Calcium: 10 mg/dL (ref 8.6–10.3)
GFR, Est African American: 77 mL/min/{1.73_m2} (ref >=60)
GFR, Est Non African American: 66 mL/min/{1.73_m2} (ref >=60)
GLUCOSE: 115 mg/dL — AB (ref 65–99)
Globulin: 2.4 g/dL (calc) (ref 1.9–3.7)
Potassium: 4.4 mmol/L (ref 3.5–5.3)
Sodium: 138 mmol/L (ref 135–146)
Total Protein: 7.1 g/dL (ref 6.1–8.1)

## 2018-03-22 LAB — HEMOGLOBIN A1C
Hgb A1c MFr Bld: 5.3 % of total Hgb (ref <5.7)
Mean Plasma Glucose: 105 (calc)
eAG (mmol/L): 5.8 (calc)

## 2018-03-22 LAB — URINALYSIS, ROUTINE W REFLEX MICROSCOPIC
BILIRUBIN URINE: NEGATIVE
Bacteria, UA: NONE SEEN /HPF
GLUCOSE, UA: NEGATIVE
HGB URINE DIPSTICK: NEGATIVE
Hyaline Cast: NONE SEEN /LPF
KETONES UR: NEGATIVE
NITRITE: NEGATIVE
Protein, ur: NEGATIVE
RBC / HPF: NONE SEEN /HPF (ref 0–2)
Specific Gravity, Urine: 1.02 (ref 1.001–1.03)
Squamous Epithelial / LPF: NONE SEEN /HPF (ref <=5)

## 2018-03-22 LAB — MICROALBUMIN / CREATININE URINE RATIO
Creatinine, Urine: 189 mg/dL (ref 20–320)
MICROALB/CREAT RATIO: 4 ug/mg{creat} (ref <30)
Microalb, Ur: 0.8 mg/dL

## 2018-03-22 LAB — LIPID PANEL
CHOLESTEROL: 183 mg/dL (ref <200)
HDL: 36 mg/dL — ABNORMAL LOW (ref >40)
LDL CHOLESTEROL (CALC): 110 mg/dL — AB
Non-HDL Cholesterol (Calc): 147 mg/dL (calc) — ABNORMAL HIGH (ref <130)
TRIGLYCERIDES: 241 mg/dL — AB (ref <150)
Total CHOL/HDL Ratio: 5.1 (calc) — ABNORMAL HIGH (ref <5.0)

## 2018-03-22 LAB — MAGNESIUM: MAGNESIUM: 2.2 mg/dL (ref 1.5–2.5)

## 2018-03-22 LAB — PSA: PSA: 0.3 ng/mL (ref <=4.0)

## 2018-03-22 LAB — TSH: TSH: 2.17 mIU/L (ref 0.40–4.50)

## 2018-03-22 LAB — VITAMIN D 25 HYDROXY (VIT D DEFICIENCY, FRACTURES): Vit D, 25-Hydroxy: 64 ng/mL (ref 30–100)

## 2018-03-22 LAB — INSULIN, RANDOM: Insulin: 15.1 u[IU]/mL (ref 2.0–19.6)

## 2018-05-10 ENCOUNTER — Ambulatory Visit (INDEPENDENT_AMBULATORY_CARE_PROVIDER_SITE_OTHER): Payer: Medicare Other

## 2018-05-10 DIAGNOSIS — Z23 Encounter for immunization: Secondary | ICD-10-CM | POA: Diagnosis not present

## 2018-05-11 ENCOUNTER — Other Ambulatory Visit: Payer: Self-pay | Admitting: Internal Medicine

## 2018-05-26 DIAGNOSIS — L570 Actinic keratosis: Secondary | ICD-10-CM | POA: Diagnosis not present

## 2018-05-26 DIAGNOSIS — L821 Other seborrheic keratosis: Secondary | ICD-10-CM | POA: Diagnosis not present

## 2018-05-26 DIAGNOSIS — Z85828 Personal history of other malignant neoplasm of skin: Secondary | ICD-10-CM | POA: Diagnosis not present

## 2018-05-26 DIAGNOSIS — D1801 Hemangioma of skin and subcutaneous tissue: Secondary | ICD-10-CM | POA: Diagnosis not present

## 2018-06-07 DIAGNOSIS — E663 Overweight: Secondary | ICD-10-CM | POA: Insufficient documentation

## 2018-06-07 DIAGNOSIS — E669 Obesity, unspecified: Secondary | ICD-10-CM | POA: Insufficient documentation

## 2018-06-07 NOTE — Progress Notes (Signed)
MEDICARE ANNUAL WELLNESS VISIT AND FOLLOW UP Assessment:    Eric [name] was seen today for follow-up and medicare wellness.  Diagnoses and all orders for this visit:  Encounter for Medicare annual wellness exam  Labile hypertension Controlled by lifestyle modification at this time Monitor blood pressure at home; call if consistently over 130/80 Continue DASH diet.   Reminder to go to the ER if any CP, SOB, nausea, dizziness, severe HA, changes vision/speech, left arm numbness and tingling and jaw pain.  Vitamin D deficiency Continue supplementation Check vitamin D level  Other abnormal glucose Recent A1Cs at goal Discussed diet/exercise, weight management  Defer A1C; check CMP -     COMPLETE METABOLIC PANEL WITH GFR  Medication management -     COMPLETE METABOLIC PANEL WITH GFR -     CBC with Differential/Platelet  Mixed hyperlipidemia Mild elevations treated by lifestyle modification Continue low cholesterol diet and exercise.  Check lipid panel.  -     Lipid panel -     TSH  Depression, controlled Continue medications  Lifestyle discussed: diet/exerise, sleep hygiene, stress management, hydration  Obesity Long discussion about weight loss, diet, and exercise Recommended diet heavy in fruits and veggies and low in animal meats, cheeses, and dairy products, appropriate calorie intake Patient will work on reducing snacking/portion control Discussed appropriate weight for height (below 190lb) and initial goal (<220lb) Follow up at next visit  History of smoking greater than 50 pack years -lung cancer screening with low dose CT discussed as recommended by guidelines based on age, number of pack year history.  Discussed risks of screening including but not limited to false positives on xray, further testing or consultation with specialist, and possible false negative CT as well. Understanding expressed and wishes to proceed with CT testing. Order placed.  -     CT CHEST LUNG  CA SCREEN LOW DOSE W/O CM; Future   Over 30 minutes of exam, counseling, chart review, and critical decision making was performed  Future Appointments  Date Time Provider [place]  12/30/2018  9:00 AM Unk Pinto, MD [facility] None     Plan:   During the course of the visit the patient was educated and counseled about appropriate screening and preventive services including:    Pneumococcal vaccine   Influenza vaccine  Prevnar 13  Td vaccine  Screening electrocardiogram  Colorectal cancer screening  Diabetes screening  Glaucoma screening  Nutrition counseling    Subjective:  Eric [name] is a 67 y.o. male who presents for Medicare Annual Wellness Visit and 6 month follow up for HTN, hyperlipidemia, glucose management, and vitamin D Def.   He has hx of depression controlled on celexa 40 mg daily. 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   He was seeing Dr. Collier Salina for knees, hasn't had any issues.   BMI is Body mass index is 30.19 kg/m., he has been working on diet and exercise, but admits he snacks a lot.  Wt Readings from Last 3 Encounters:  06/08/18 228 lb 12.8 oz (103.8 kg)  12/07/17 219 lb 12.8 oz (99.7 kg)  11/25/16 226 lb 12.8 oz (102.9 kg)   His blood pressure has been controlled at home, today their BP is BP: 124/86 He does workout. He denies chest pain, shortness of breath, dizziness.   He is not on cholesterol medication and denies myalgias. His cholesterol is not at goal. The cholesterol last visit was:   Lab Results  Component Value Date  CHOL 183 12/07/2017   HDL 36 (L) 12/07/2017   LDLCALC 110 (H) 12/07/2017   TRIG 241 (H) 12/07/2017   CHOLHDL 5.1 (H) 12/07/2017   He has been working on diet and exercise for glucose management, and denies increased appetite, nausea, paresthesia of the feet, polydipsia, polyuria and visual disturbances. Last A1C in the office was:  Lab Results  Component Value Date    HGBA1C 5.3 12/07/2017   Last GFR Lab Results  Component Value Date   GFRNONAA 66 12/07/2017    Patient is on Vitamin D supplement.   Lab Results  Component Value Date   VD25OH 64 12/07/2017      Medication Review:     Current Outpatient Medications (Analgesics):  Marland Kitchen  BABY ASPIRIN PO, Take 81 mg by mouth 2 (two) times daily.    Current Outpatient Medications (Other):  Marland Kitchen  Cholecalciferol (VITAMIN D PO), Take 10,000 Int'l Units by mouth daily. .  citalopram (CELEXA) 40 MG tablet, TAKE ONE TABLET BY MOUTH ONE TIME DAILY  Allergies: Allergies  Allergen Reactions  . Lipitor [Atorvastatin]   . Lopid [Gemfibrozil]   . Penicillins     Current Problems (verified) has Labile hypertension; Other abnormal glucose; Hyperlipidemia; Vitamin D deficiency; Depression, controlled; Medication management; Overweight (BMI 25.0-29.9); and History of smoking greater than 50 pack years on their problem list.  Screening Tests Immunization History  Administered Date(s) Administered  . Influenza Split 04/13/2013, 04/06/2014, 04/15/2015  . Influenza, High Dose Seasonal PF 04/23/2016, 05/10/2018  . Pneumococcal Conjugate-13 10/25/2015  . Pneumococcal Polysaccharide-23 12/25/2015  . Pneumococcal-Unspecified 06/22/2002  . Tdap 09/17/2011, 05/10/2018    Preventative care: Last colonoscopy: 2006 Cologuard: 12/2017  Prior vaccinations: TD or Tdap: 2019  Influenza: 04/2018  Pneumococcal: 2017 Prevnar13: 2017 Shingles/Zostavax: declines   Names of Other Physician/Practitioners you currently use: 1. [facility] here for primary care 2. [facility], eye doctor, last visit 2019 3. Dr. Johnnye Sima, dentist, last visit 2019, has full dentures 4. Dr. Joyice Faster, dermatology, last 2019, goes annually  Patient Care Team: Unk Pinto, MD as PCP - General (Internal Medicine) Richmond Campbell, MD as Consulting Physician  (Gastroenterology)  Surgical: He  has a past surgical history that includes Knee arthroscopy (Left, 1988). Family His family history includes Alzheimer's disease in his mother; Colon polyps in his father; Diabetes in his mother; Heart disease in his father; Hypertension in his father; Stroke in his father. Social history  He reports that he quit smoking about 13 years ago. His smoking use included cigarettes. He started smoking about 50 years ago. He has a 55.50 pack-year smoking history. He has never used smokeless tobacco. He reports current alcohol use. He reports that he does not use drugs.  MEDICARE WELLNESS OBJECTIVES: Physical activity: Current Exercise Habits: Home exercise routine, Type of exercise: walking;strength training/weights, Time (Minutes): 30, Frequency (Times/Week): 7, Weekly Exercise (Minutes/Week): 210, Intensity: Mild, Exercise limited by: None identified Cardiac risk factors: Cardiac Risk Factors include: male gender;dyslipidemia;advanced age (>77men, >59 women);hypertension;obesity (BMI >30kg/m2);smoking/ tobacco exposure Depression/mood screen:   Depression screen [facility] 2/9 06/08/2018  Decreased Interest 0  Down, Depressed, Hopeless 0  PHQ - 2 Score 0    ADLs:  In your present state of health, do you have any difficulty performing the following activities: 06/08/2018 12/06/2017  Hearing? N N  Vision? N N  Difficulty concentrating or making decisions? N N  Walking or climbing stairs? N N  Dressing or bathing? N N  Doing errands, shopping?  N N  Some recent data might be hidden     Cognitive Testing  Alert? Yes  Normal Appearance?Yes  Oriented to person? Yes  Place? Yes   Time? Yes  Recall of three objects?  Yes  Can perform simple calculations? Yes  Displays appropriate judgment?Yes  Can read the correct time from a watch face?Yes  EOL planning: Does Patient Have a Medical Advance Directive?: Yes Type of Advance Directive: Healthcare Power of Attorney,  Living will Does patient want to make changes to medical advance directive?: No - Patient declined Copy of [place] in Chart?: No - copy requested   Objective:   Today's Vitals   06/08/18 1119  BP: 124/86  Pulse: 84  Temp: 97.7 F (36.5 C)  SpO2: 96%  Weight: 228 lb 12.8 oz (103.8 kg)  Height: 6\' 1"  (1.854 m)   Body mass index is 30.19 kg/m.  General appearance: alert, no distress, WD/WN, male HEENT: normocephalic, sclerae anicteric, TMs pearly, nares patent, no discharge or erythema, pharynx normal Oral cavity: MMM, no lesions Neck: supple, no lymphadenopathy, no thyromegaly, no masses Heart: RRR, normal S1, S2, no murmurs Lungs: CTA bilaterally, no wheezes, rhonchi, or rales Abdomen: +bs, soft, non tender, non distended, no masses, no hepatomegaly, no splenomegaly Musculoskeletal: nontender, no swelling, no obvious deformity Extremities: no edema, no cyanosis, no clubbing Pulses: 2+ symmetric, upper and lower extremities, normal cap refill Neurological: alert, oriented x 3, CN2-12 intact, strength normal upper extremities and lower extremities, sensation normal throughout, DTRs 2+ throughout, no cerebellar signs, gait normal Psychiatric: normal affect, behavior normal, pleasant   Medicare Attestation I have personally reviewed: The patient's medical and social history Their use of alcohol, tobacco or illicit drugs Their current medications and supplements The patient's functional ability including ADLs,fall risks, home safety risks, cognitive, and hearing and visual impairment Diet and physical activities Evidence for depression or mood disorders  The patient's weight, height, BMI, and visual acuity have been recorded in the chart.  I have made referrals, counseling, and provided education to the patient based on review of the above and I have provided the patient with a written personalized care plan for preventive services.     Izora Ribas,  NP   06/08/2018

## 2018-06-08 ENCOUNTER — Ambulatory Visit (INDEPENDENT_AMBULATORY_CARE_PROVIDER_SITE_OTHER): Payer: Medicare Other | Admitting: Adult Health

## 2018-06-08 ENCOUNTER — Encounter: Payer: Self-pay | Admitting: Adult Health

## 2018-06-08 VITALS — BP 124/86 | HR 84 | Temp 97.7°F | Ht 73.0 in | Wt 228.8 lb

## 2018-06-08 DIAGNOSIS — R6889 Other general symptoms and signs: Secondary | ICD-10-CM | POA: Diagnosis not present

## 2018-06-08 DIAGNOSIS — F329 Major depressive disorder, single episode, unspecified: Secondary | ICD-10-CM

## 2018-06-08 DIAGNOSIS — F32A Depression, unspecified: Secondary | ICD-10-CM

## 2018-06-08 DIAGNOSIS — R7309 Other abnormal glucose: Secondary | ICD-10-CM | POA: Diagnosis not present

## 2018-06-08 DIAGNOSIS — Z87891 Personal history of nicotine dependence: Secondary | ICD-10-CM

## 2018-06-08 DIAGNOSIS — Z79899 Other long term (current) drug therapy: Secondary | ICD-10-CM

## 2018-06-08 DIAGNOSIS — Z0000 Encounter for general adult medical examination without abnormal findings: Secondary | ICD-10-CM

## 2018-06-08 DIAGNOSIS — E782 Mixed hyperlipidemia: Secondary | ICD-10-CM

## 2018-06-08 DIAGNOSIS — E669 Obesity, unspecified: Secondary | ICD-10-CM | POA: Diagnosis not present

## 2018-06-08 DIAGNOSIS — E559 Vitamin D deficiency, unspecified: Secondary | ICD-10-CM | POA: Diagnosis not present

## 2018-06-08 DIAGNOSIS — Z0001 Encounter for general adult medical examination with abnormal findings: Secondary | ICD-10-CM

## 2018-06-08 DIAGNOSIS — Z122 Encounter for screening for malignant neoplasm of respiratory organs: Secondary | ICD-10-CM

## 2018-06-08 DIAGNOSIS — R0989 Other specified symptoms and signs involving the circulatory and respiratory systems: Secondary | ICD-10-CM

## 2018-06-08 NOTE — Patient Instructions (Signed)
Eric [name] , Thank you for taking time to come for your Medicare Wellness Visit. I appreciate your ongoing commitment to your health goals. Please review the following plan we discussed and let me know if I can assist you in the future.   These are the goals we discussed: Goals    . DIET - INCREASE WATER INTAKE     65-80+ fluid ounces of clear fluids    . Weight (lb) < 220 lb (99.8 kg)       This is a list of the screening recommended for you and due dates:  Health Maintenance  Topic Date Due  . Cologuard (Stool DNA test)  12/27/2020  . Tetanus Vaccine  05/10/2028  . Flu Shot  Completed  . Pneumonia vaccines  Completed  .  Hepatitis C: One time screening is recommended by Center for Disease Control  (CDC) for  adults born from [birth year] through 1966.   Discontinued    Know what a healthy weight is for you (roughly BMI <25) and aim to maintain this  Aim for 7+ servings of fruits and vegetables daily  65-80+ fluid ounces of water or unsweet tea for healthy kidneys  Limit to max 1 drink of alcohol per day; avoid smoking/tobacco  Limit animal fats in diet for cholesterol and heart health - choose grass fed whenever available  Avoid highly processed foods, and foods high in saturated/trans fats  Aim for low stress - take time to unwind and care for your mental health  Aim for 150 min of moderate intensity exercise weekly for heart health, and weights twice weekly for bone health  Aim for 7-9 hours of sleep daily     Lung Cancer Screening A lung cancer screening is a test that checks for lung cancer. Lung cancer screening is done to look for lung cancer in its very early stages, before it spreads and becomes harder to treat and before symptoms appear. Finding cancer early improves the chances of successful treatment. It may save your life. Should I be screened for lung cancer? You should be screened for lung cancer if all of these apply:  You currently smoke or you have quit  smoking within the past 15 years.  You are 106-16 years old. Screening may be recommended up to age 42 depending on your overall health and other factors.  You are in good general health.  You have a smoking history of 1 pack a day for 30 years or 2 packs a day for 15 years. Screening may also be recommended if you are at high risk for the disease. You may be at high risk if:  You have a family history of lung cancer.  You have been exposed to asbestos.  You have chronic obstructive pulmonary disease (COPD).  You have a history of previous lung cancer. How often should I be screened for lung cancer?  If you are at risk for lung cancer, it is recommended that you are screened once a year. The recommended screening test is a low-dose CT scan. How can I lower my risk of lung cancer? To lower your risk of developing lung cancer:  If you smoke, stop smoking all tobacco products.  Avoid secondhand smoke.  Avoid exposure to radiation.  Avoid exposure to radon gas. Have your home checked for radon regularly.  Avoid things that cause cancer (carcinogens).  Avoid living or working in places with high air pollution. Where to find more information Ask your health care provider about  the risks and benefits of screening. More information and resources are available from these organizations:  [place] (ACS): www.cancer.org  American Lung Association: www.lung.org Contact a health care provider if:  You start to show symptoms of lung cancer, including: ? Coughing that will not go away. ? Wheezing. ? Chest pain. ? Coughing up blood. ? Shortness of breath. ? Weight loss that cannot be explained. ? Constant fatigue. Summary  Lung cancer screening may find lung cancer before symptoms appear. Finding cancer early improves the chances of successful treatment. It may save your life.  If you are at risk for lung cancer, it is recommended that you are screened once a year.  The recommended screening test is a low-dose CT scan.  You can make lifestyle changes to lower your risk of lung cancer.  Ask your health care provider about the risks and benefits of screening. This information is not intended to replace advice given to you by your health care provider. Make sure you discuss any questions you have with your health care provider. Document Released: 04/29/2016 Document Revised: 08/31/2017 Document Reviewed: 04/29/2016 Elsevier Interactive Patient Education  [employer].

## 2018-06-09 LAB — COMPLETE METABOLIC PANEL WITH GFR
AG Ratio: 1.9 (calc) (ref 1.0–2.5)
ALBUMIN MSPROF: 4.7 g/dL (ref 3.6–5.1)
ALKALINE PHOSPHATASE (APISO): 74 U/L (ref 40–115)
ALT: 26 U/L (ref 9–46)
AST: 22 U/L (ref 10–35)
BUN / CREAT RATIO: 10 (calc) (ref 6–22)
BUN: 13 mg/dL (ref 7–25)
CHLORIDE: 102 mmol/L (ref 98–110)
CO2: 24 mmol/L (ref 20–32)
Calcium: 9.7 mg/dL (ref 8.6–10.3)
Creat: 1.29 mg/dL — ABNORMAL HIGH (ref 0.70–1.25)
GFR, Est African American: 66 mL/min/{1.73_m2} (ref >=60)
GFR, Est Non African American: 57 mL/min/{1.73_m2} — ABNORMAL LOW (ref >=60)
GLOBULIN: 2.5 g/dL (ref 1.9–3.7)
Glucose, Bld: 100 mg/dL — ABNORMAL HIGH (ref 65–99)
Potassium: 4.5 mmol/L (ref 3.5–5.3)
SODIUM: 137 mmol/L (ref 135–146)
Total Bilirubin: 0.7 mg/dL (ref 0.2–1.2)
Total Protein: 7.2 g/dL (ref 6.1–8.1)

## 2018-06-09 LAB — CBC WITH DIFFERENTIAL/PLATELET
Absolute Monocytes: 571 cells/uL (ref 200–950)
BASOS ABS: 61 {cells}/uL (ref 0–200)
Basophils Relative: 0.9 %
EOS ABS: 88 {cells}/uL (ref 15–500)
Eosinophils Relative: 1.3 %
HEMATOCRIT: 48.1 % (ref 38.5–50.0)
Hemoglobin: 16.3 g/dL (ref 13.2–17.1)
LYMPHS ABS: 2727 {cells}/uL (ref 850–3900)
MCH: 34.3 pg — AB (ref 27.0–33.0)
MCHC: 33.9 g/dL (ref 32.0–36.0)
MCV: 101.3 fL — AB (ref 80.0–100.0)
MPV: 9.4 fL (ref 7.5–12.5)
Monocytes Relative: 8.4 %
NEUTROS ABS: 3352 {cells}/uL (ref 1500–7800)
Neutrophils Relative %: 49.3 %
Platelets: 287 10*3/uL (ref 140–400)
RBC: 4.75 10*6/uL (ref 4.20–5.80)
RDW: 11.7 % (ref 11.0–15.0)
Total Lymphocyte: 40.1 %
WBC: 6.8 10*3/uL (ref 3.8–10.8)

## 2018-06-09 LAB — TSH: TSH: 1.69 m[IU]/L (ref 0.40–4.50)

## 2018-06-09 LAB — LIPID PANEL
CHOL/HDL RATIO: 4.4 (calc) (ref <5.0)
CHOLESTEROL: 180 mg/dL (ref <200)
HDL: 41 mg/dL (ref >40)
LDL Cholesterol (Calc): 105 mg/dL (calc) — ABNORMAL HIGH
Non-HDL Cholesterol (Calc): 139 mg/dL (calc) — ABNORMAL HIGH (ref <130)
Triglycerides: 215 mg/dL — ABNORMAL HIGH (ref <150)

## 2018-06-21 ENCOUNTER — Other Ambulatory Visit: Payer: Self-pay | Admitting: Adult Health

## 2018-06-21 ENCOUNTER — Telehealth: Payer: Self-pay

## 2018-06-21 NOTE — Addendum Note (Signed)
Addended by: Izora Ribas on: 06/21/2018 05:21 PM   Modules accepted: Orders

## 2018-06-21 NOTE — Telephone Encounter (Signed)
[facility] called asking for a new order for patient's CT scan. Requesting it to be CT chest without, so they can follow up yearly rather than having the screening done only once for CT chest lung cancer screening.

## 2018-06-28 ENCOUNTER — Other Ambulatory Visit: Payer: Self-pay | Admitting: Adult Health

## 2018-06-28 ENCOUNTER — Encounter: Payer: Self-pay | Admitting: Adult Health

## 2018-06-28 ENCOUNTER — Ambulatory Visit
Admission: RE | Admit: 2018-06-28 | Discharge: 2018-06-28 | Disposition: A | Discharge status: Home / Self Care | Payer: Medicare Other | Source: Ambulatory Visit | Attending: Adult Health | Admitting: Adult Health

## 2018-06-28 DIAGNOSIS — Z87891 Personal history of nicotine dependence: Secondary | ICD-10-CM

## 2018-06-28 DIAGNOSIS — J439 Emphysema, unspecified: Secondary | ICD-10-CM | POA: Insufficient documentation

## 2018-06-28 DIAGNOSIS — I700 Atherosclerosis of aorta: Secondary | ICD-10-CM | POA: Insufficient documentation

## 2018-06-28 DIAGNOSIS — Z122 Encounter for screening for malignant neoplasm of respiratory organs: Secondary | ICD-10-CM

## 2018-06-28 DIAGNOSIS — K449 Diaphragmatic hernia without obstruction or gangrene: Secondary | ICD-10-CM | POA: Diagnosis not present

## 2018-08-03 ENCOUNTER — Other Ambulatory Visit: Payer: Self-pay | Admitting: Internal Medicine

## 2018-08-03 MED ORDER — AZITHROMYCIN 250 MG PO TABS: ORAL_TABLET | ORAL | 1 refills | Status: DC

## 2018-08-03 MED ORDER — PREDNISONE 20 MG PO TABS: ORAL_TABLET | ORAL | 0 refills | Status: DC

## 2018-12-30 ENCOUNTER — Encounter: Payer: Self-pay | Admitting: Internal Medicine

## 2019-01-04 ENCOUNTER — Ambulatory Visit (INDEPENDENT_AMBULATORY_CARE_PROVIDER_SITE_OTHER): Payer: Medicare Other | Admitting: Internal Medicine

## 2019-01-04 ENCOUNTER — Other Ambulatory Visit: Payer: Self-pay

## 2019-01-04 ENCOUNTER — Encounter: Payer: Self-pay | Admitting: Internal Medicine

## 2019-01-04 VITALS — BP 136/82 | HR 84 | Temp 97.4°F | Resp 16 | Ht 73.0 in | Wt 227.4 lb

## 2019-01-04 DIAGNOSIS — N401 Enlarged prostate with lower urinary tract symptoms: Secondary | ICD-10-CM

## 2019-01-04 DIAGNOSIS — I700 Atherosclerosis of aorta: Secondary | ICD-10-CM | POA: Diagnosis not present

## 2019-01-04 DIAGNOSIS — Z136 Encounter for screening for cardiovascular disorders: Secondary | ICD-10-CM

## 2019-01-04 DIAGNOSIS — Z125 Encounter for screening for malignant neoplasm of prostate: Secondary | ICD-10-CM | POA: Diagnosis not present

## 2019-01-04 DIAGNOSIS — E559 Vitamin D deficiency, unspecified: Secondary | ICD-10-CM

## 2019-01-04 DIAGNOSIS — Z79899 Other long term (current) drug therapy: Secondary | ICD-10-CM

## 2019-01-04 DIAGNOSIS — Z1211 Encounter for screening for malignant neoplasm of colon: Secondary | ICD-10-CM

## 2019-01-04 DIAGNOSIS — N138 Other obstructive and reflux uropathy: Secondary | ICD-10-CM | POA: Diagnosis not present

## 2019-01-04 DIAGNOSIS — R0989 Other specified symptoms and signs involving the circulatory and respiratory systems: Secondary | ICD-10-CM | POA: Diagnosis not present

## 2019-01-04 DIAGNOSIS — Z8249 Family history of ischemic heart disease and other diseases of the circulatory system: Secondary | ICD-10-CM | POA: Diagnosis not present

## 2019-01-04 DIAGNOSIS — Z87891 Personal history of nicotine dependence: Secondary | ICD-10-CM

## 2019-01-04 DIAGNOSIS — R7309 Other abnormal glucose: Secondary | ICD-10-CM

## 2019-01-04 DIAGNOSIS — R7303 Prediabetes: Secondary | ICD-10-CM | POA: Diagnosis not present

## 2019-01-04 DIAGNOSIS — E782 Mixed hyperlipidemia: Secondary | ICD-10-CM

## 2019-01-04 NOTE — Patient Instructions (Signed)

## 2019-01-04 NOTE — Progress Notes (Signed)
[facility]  Unk Pinto, M.D.        Uvaldo Bristle. Silverio Lay, P.A.-C         Liane Comber, [place]                [address] [place] [address] Telephone [phone] Telefax [phone] Comprehensive Evaluation & Examination     This very nice 68 y.o. MWM presents for a  comprehensive evaluation and management of multiple medical co-morbidities.  Patient has been followed for labile HTN, HLD, Prediabetes and Vitamin D Deficiency.     Patient has been followed expectantly for labile HTN predating circa 2002. Patient's BP has been controlled at home.  Today's BP is at goal - 136/82. Patient denies any cardiac symptoms as chest pain, palpitations, shortness of breath, dizziness or ankle swelling.     Patient's hyperlipidemia is not controlled with diet. Current lipids are at goal albeit elevated Trig's: Lab Results  Component Value Date   CHOL 174 01/04/2019   HDL 36 (L) 01/04/2019   LDLCALC 98 01/04/2019   TRIG 303 (H) 01/04/2019   CHOLHDL 4.8 01/04/2019      Patient has hx/o prediabetes (A1c 5.8% / 2013 & 5.6% / 2015)and patient denies reactive hypoglycemic symptoms, visual blurring, diabetic polys or paresthesias. Last A1c was Normal: Lab Results  Component Value Date   HGBA1C 5.4 01/04/2019       Finally, patient has history of Vitamin D Deficiency ("27" / 2008)  and last vitamin D was at goal; Lab Results  Component Value Date   VD25OH 68 01/04/2019   Current Outpatient Medications on File Prior to Visit  Medication Sig  . BABY ASPIRIN PO Take 81 mg by mouth daily.   . Cholecalciferol (VITAMIN D PO) Take 10,000 Int'l Units by mouth daily.   No current facility-administered medications on file prior to visit.    Allergies  Allergen Reactions  . Lipitor [Atorvastatin]   . Lopid [Gemfibrozil]   . Penicillins    Past Medical History:  Diagnosis Date  . Hyperlipidemia    . Labile hypertension   . Other testicular hypofunction   . Prediabetes   . Vitamin D deficiency    Health Maintenance  Topic Date Due  . INFLUENZA VACCINE  01/21/2019  . Fecal DNA (Cologuard)  12/27/2020  . TETANUS/TDAP  05/10/2028  . PNA vac Low Risk Adult  Completed  . Hepatitis C Screening  Discontinued   Immunization History  Administered Date(s) Administered  . Influenza Split 04/13/2013, 04/06/2014, 04/15/2015  . Influenza, High Dose Seasonal PF 04/23/2016, 05/10/2018  . Pneumococcal Conjugate-13 10/25/2015  . Pneumococcal Polysaccharide-23 12/25/2015  . Pneumococcal-Unspecified 06/22/2002  . Tdap 09/17/2011, 05/10/2018   Last Colon - 08/01/2004 - Dr Earlean Shawl  Cologard - 12/27/2017 - Negative - recc 3 yr f/u due [id number] Past Surgical History:  Procedure Laterality Date  . CATARACT EXTRACTION, BILATERAL Bilateral 2019  . KNEE ARTHROSCOPY Left 1988   Family History  Problem Relation Age of Onset  . Diabetes Mother   . Alzheimer's disease Mother   . Heart disease Father   . Hypertension Father   . Stroke Father   . Colon polyps Father    Social History    ROS Constitutional: Denies fever, chills, weight loss/gain, headaches, insomnia,  night sweats or change in appetite. Does c/o fatigue. Eyes: Denies redness, blurred vision,  diplopia, discharge, itchy or watery eyes.  ENT: Denies discharge, congestion, post nasal drip, epistaxis, sore throat, earache, hearing loss, dental pain, Tinnitus, Vertigo, Sinus pain or snoring.  Cardio: Denies chest pain, palpitations, irregular heartbeat, syncope, dyspnea, diaphoresis, orthopnea, PND, claudication or edema Respiratory: denies cough, dyspnea, DOE, pleurisy, hoarseness, laryngitis or wheezing.  Gastrointestinal: Denies dysphagia, heartburn, reflux, water brash, pain, cramps, nausea, vomiting, bloating, diarrhea, constipation, hematemesis, melena, hematochezia, jaundice or hemorrhoids Genitourinary: Denies dysuria,  frequency, urgency, nocturia, hesitancy, discharge, hematuria or flank pain Musculoskeletal: Denies arthralgia, myalgia, stiffness, Jt. Swelling, pain, limp or strain/sprain. Denies Falls. Skin: Denies puritis, rash, hives, warts, acne, eczema or change in skin lesion Neuro: No weakness, tremor, incoordination, spasms, paresthesia or pain Psychiatric: Denies confusion, memory loss or sensory loss. Denies Depression. Endocrine: Denies change in weight, skin, hair change, nocturia, and paresthesia, diabetic polys, visual blurring or hyper / hypo glycemic episodes.  Heme/Lymph: No excessive bleeding, bruising or enlarged lymph nodes.  Physical Exam  BP 136/82   Pulse 84   Temp (!) 97.4 F (36.3 C)   Resp 16   Ht 6\' 1"  (1.854 m)   Wt 227 lb 6.4 oz (103.1 kg)   BMI 30.00 kg/m   General Appearance: Well nourished and well groomed and in no apparent distress.  Eyes: PERRLA, EOMs, conjunctiva no swelling or erythema, normal fundi and vessels. Sinuses: No frontal/maxillary tenderness ENT/Mouth: EACs patent / TMs  nl. Nares clear without erythema, swelling, mucoid exudates. Oral hygiene is good. No erythema, swelling, or exudate. Tongue normal, non-obstructing. Tonsils not swollen or erythematous. Hearing normal.  Neck: Supple, thyroid not palpable. No bruits, nodes or JVD. Respiratory: Respiratory effort normal.  BS equal and clear bilateral without rales, rhonci, wheezing or stridor. Cardio: Heart sounds are normal with regular rate and rhythm and no murmurs, rubs or gallops. Peripheral pulses are normal and equal bilaterally without edema. No aortic or femoral bruits. Chest: symmetric with normal excursions and percussion.  Abdomen: Soft, with Nl bowel sounds. Nontender, no guarding, rebound, hernias, masses, or organomegaly.  Lymphatics: Non tender without lymphadenopathy.  Musculoskeletal: Full ROM all peripheral extremities, joint stability, 5/5 strength, and normal gait. Skin: Warm and  dry without rashes, lesions, cyanosis, clubbing or  ecchymosis.  Neuro: Cranial nerves intact, reflexes equal bilaterally. Normal muscle tone, no cerebellar symptoms. Sensation intact.  Pysch: Alert and oriented X 3 with normal affect, insight and judgment appropriate.   Assessment and Plan  1. Labile hypertension  - EKG 12-Lead - [place], RETROPERITNL ABD,  LTD - Urinalysis, Routine w reflex microscopic - Microalbumin / creatinine urine ratio - CBC with Differential/Platelet - COMPLETE METABOLIC PANEL WITH GFR - Magnesium - TSH  2. Hyperlipidemia, mixed  - EKG 12-Lead - [place], RETROPERITNL ABD,  LTD - Lipid panel - TSH  3. Abnormal glucose  - EKG 12-Lead - [place], RETROPERITNL ABD,  LTD - Hemoglobin A1c - Insulin, random  4. Vitamin D deficiency  - VITAMIN D 25 Hydroxy l  5. Prediabetes  - EKG 12-Lead - [place], RETROPERITNL ABD,  LTD - Hemoglobin A1c - Insulin, random  6. Screening for colorectal cancer  - POC Hemoccult Bld/Stl  7. BPH with obstruction/lower urinary tract symptoms  - PSA  8. Prostate cancer screening  - PSA  9. Screening for ischemic heart disease  - EKG 12-Lead  10. FHx: heart disease  - EKG 12-Lead - [place], RETROPERITNL ABD,  LTD  11. Former smoker  - EKG 12-Lead - [place], RETROPERITNL ABD,  LTD  12.  Screening for AAA (aortic abdominal aneurysm)  - [place], RETROPERITNL ABD,  LTD  13. Medication management  - Urinalysis, Routine w reflex microscopic - Microalbumin / creatinine urine ratio - CBC with Differential/Platelet - COMPLETE METABOLIC PANEL WITH GFR - Magnesium - Lipid panel - TSH - Hemoglobin A1c - Insulin, random - VITAMIN D 25 Hydroxyl  14. Atherosclerosis of aorta ([place])        Patient was counseled in prudent diet, weight control to achieve/maintain BMI less than 25, BP monitoring, regular exercise and medications as discussed.  Discussed med effects and SE's. Routine screening labs and tests as requested with regular follow-up  as recommended. Over 40 minutes of exam, counseling, chart review and high complex critical decision making was performed   Kirtland Bouchard, MD

## 2019-01-05 LAB — URINALYSIS, ROUTINE W REFLEX MICROSCOPIC
Bilirubin Urine: NEGATIVE
Glucose, UA: NEGATIVE
Hgb urine dipstick: NEGATIVE
Ketones, ur: NEGATIVE
Leukocytes,Ua: NEGATIVE
Nitrite: NEGATIVE
Protein, ur: NEGATIVE
Specific Gravity, Urine: 1.019 (ref 1.001–1.03)
pH: 6 (ref 5.0–8.0)

## 2019-01-05 LAB — LIPID PANEL
Cholesterol: 174 mg/dL (ref <200)
HDL: 36 mg/dL — ABNORMAL LOW (ref >=40)
LDL Cholesterol (Calc): 98 mg/dL (calc)
Non-HDL Cholesterol (Calc): 138 mg/dL (calc) — ABNORMAL HIGH (ref <130)
Total CHOL/HDL Ratio: 4.8 (calc) (ref <5.0)
Triglycerides: 303 mg/dL — ABNORMAL HIGH (ref <150)

## 2019-01-05 LAB — MAGNESIUM: Magnesium: 2 mg/dL (ref 1.5–2.5)

## 2019-01-05 LAB — MICROALBUMIN / CREATININE URINE RATIO
Creatinine, Urine: 168 mg/dL (ref 20–320)
Microalb Creat Ratio: 3 mcg/mg creat (ref <30)
Microalb, Ur: 0.5 mg/dL

## 2019-01-05 LAB — TSH: TSH: 2.39 mIU/L (ref 0.40–4.50)

## 2019-01-05 LAB — CBC WITH DIFFERENTIAL/PLATELET
Absolute Monocytes: 579 cells/uL (ref 200–950)
Basophils Absolute: 39 cells/uL (ref 0–200)
Basophils Relative: 0.6 %
Eosinophils Absolute: 182 cells/uL (ref 15–500)
Eosinophils Relative: 2.8 %
HCT: 45.8 % (ref 38.5–50.0)
Hemoglobin: 15.6 g/dL (ref 13.2–17.1)
Lymphs Abs: 2301 cells/uL (ref 850–3900)
MCH: 34.1 pg — ABNORMAL HIGH (ref 27.0–33.0)
MCHC: 34.1 g/dL (ref 32.0–36.0)
MCV: 100 fL (ref 80.0–100.0)
MPV: 9.5 fL (ref 7.5–12.5)
Monocytes Relative: 8.9 %
Neutro Abs: 3400 cells/uL (ref 1500–7800)
Neutrophils Relative %: 52.3 %
Platelets: 242 10*3/uL (ref 140–400)
RBC: 4.58 10*6/uL (ref 4.20–5.80)
RDW: 12 % (ref 11.0–15.0)
Total Lymphocyte: 35.4 %
WBC: 6.5 10*3/uL (ref 3.8–10.8)

## 2019-01-05 LAB — VITAMIN D 25 HYDROXY (VIT D DEFICIENCY, FRACTURES): Vit D, 25-Hydroxy: 68 ng/mL (ref 30–100)

## 2019-01-05 LAB — COMPLETE METABOLIC PANEL WITH GFR
AG Ratio: 2 (calc) (ref 1.0–2.5)
ALT: 20 U/L (ref 9–46)
AST: 21 U/L (ref 10–35)
Albumin: 4.5 g/dL (ref 3.6–5.1)
Alkaline phosphatase (APISO): 67 U/L (ref 35–144)
BUN: 14 mg/dL (ref 7–25)
CO2: 28 mmol/L (ref 20–32)
Calcium: 9.6 mg/dL (ref 8.6–10.3)
Chloride: 102 mmol/L (ref 98–110)
Creat: 1.17 mg/dL (ref 0.70–1.25)
GFR, Est African American: 74 mL/min/{1.73_m2} (ref >=60)
GFR, Est Non African American: 64 mL/min/{1.73_m2} (ref >=60)
Globulin: 2.2 g/dL (calc) (ref 1.9–3.7)
Glucose, Bld: 91 mg/dL (ref 65–99)
Potassium: 4.4 mmol/L (ref 3.5–5.3)
Sodium: 137 mmol/L (ref 135–146)
Total Bilirubin: 0.9 mg/dL (ref 0.2–1.2)
Total Protein: 6.7 g/dL (ref 6.1–8.1)

## 2019-01-05 LAB — HEMOGLOBIN A1C
Hgb A1c MFr Bld: 5.4 % of total Hgb (ref <5.7)
Mean Plasma Glucose: 108 (calc)
eAG (mmol/L): 6 (calc)

## 2019-01-05 LAB — PSA: PSA: 0.3 ng/mL (ref <=4.0)

## 2019-01-05 LAB — INSULIN, RANDOM: Insulin: 10.3 u[IU]/mL

## 2019-01-07 ENCOUNTER — Other Ambulatory Visit: Payer: Self-pay | Admitting: Internal Medicine

## 2019-01-08 ENCOUNTER — Encounter: Payer: Self-pay | Admitting: Internal Medicine

## 2019-04-05 DIAGNOSIS — Z23 Encounter for immunization: Secondary | ICD-10-CM | POA: Diagnosis not present

## 2019-05-29 DIAGNOSIS — L821 Other seborrheic keratosis: Secondary | ICD-10-CM | POA: Diagnosis not present

## 2019-05-29 DIAGNOSIS — C44212 Basal cell carcinoma of skin of right ear and external auricular canal: Secondary | ICD-10-CM | POA: Diagnosis not present

## 2019-05-29 DIAGNOSIS — D485 Neoplasm of uncertain behavior of skin: Secondary | ICD-10-CM | POA: Diagnosis not present

## 2019-05-29 DIAGNOSIS — B078 Other viral warts: Secondary | ICD-10-CM | POA: Diagnosis not present

## 2019-05-29 DIAGNOSIS — L723 Sebaceous cyst: Secondary | ICD-10-CM | POA: Diagnosis not present

## 2019-05-29 DIAGNOSIS — L814 Other melanin hyperpigmentation: Secondary | ICD-10-CM | POA: Diagnosis not present

## 2019-06-13 ENCOUNTER — Other Ambulatory Visit: Payer: Self-pay

## 2019-06-13 ENCOUNTER — Encounter: Payer: Self-pay | Admitting: Adult Health

## 2019-06-13 ENCOUNTER — Ambulatory Visit (INDEPENDENT_AMBULATORY_CARE_PROVIDER_SITE_OTHER): Payer: Medicare Other | Admitting: Adult Health

## 2019-06-13 VITALS — BP 120/62 | HR 84 | Temp 96.7°F | Ht 73.0 in | Wt 227.0 lb

## 2019-06-13 DIAGNOSIS — I700 Atherosclerosis of aorta: Secondary | ICD-10-CM | POA: Diagnosis not present

## 2019-06-13 DIAGNOSIS — Z85828 Personal history of other malignant neoplasm of skin: Secondary | ICD-10-CM

## 2019-06-13 DIAGNOSIS — F339 Major depressive disorder, recurrent, unspecified: Secondary | ICD-10-CM | POA: Diagnosis not present

## 2019-06-13 DIAGNOSIS — R6889 Other general symptoms and signs: Secondary | ICD-10-CM

## 2019-06-13 DIAGNOSIS — R7309 Other abnormal glucose: Secondary | ICD-10-CM

## 2019-06-13 DIAGNOSIS — R7303 Prediabetes: Secondary | ICD-10-CM

## 2019-06-13 DIAGNOSIS — R0989 Other specified symptoms and signs involving the circulatory and respiratory systems: Secondary | ICD-10-CM

## 2019-06-13 DIAGNOSIS — M25561 Pain in right knee: Secondary | ICD-10-CM

## 2019-06-13 DIAGNOSIS — Z0001 Encounter for general adult medical examination with abnormal findings: Secondary | ICD-10-CM | POA: Diagnosis not present

## 2019-06-13 DIAGNOSIS — J439 Emphysema, unspecified: Secondary | ICD-10-CM

## 2019-06-13 DIAGNOSIS — N138 Other obstructive and reflux uropathy: Secondary | ICD-10-CM

## 2019-06-13 DIAGNOSIS — E669 Obesity, unspecified: Secondary | ICD-10-CM

## 2019-06-13 DIAGNOSIS — Z87891 Personal history of nicotine dependence: Secondary | ICD-10-CM

## 2019-06-13 DIAGNOSIS — N401 Enlarged prostate with lower urinary tract symptoms: Secondary | ICD-10-CM

## 2019-06-13 DIAGNOSIS — E559 Vitamin D deficiency, unspecified: Secondary | ICD-10-CM

## 2019-06-13 DIAGNOSIS — G8929 Other chronic pain: Secondary | ICD-10-CM

## 2019-06-13 DIAGNOSIS — Z79899 Other long term (current) drug therapy: Secondary | ICD-10-CM

## 2019-06-13 DIAGNOSIS — M25562 Pain in left knee: Secondary | ICD-10-CM

## 2019-06-13 DIAGNOSIS — Z0000 Encounter for general adult medical examination without abnormal findings: Secondary | ICD-10-CM

## 2019-06-13 DIAGNOSIS — E782 Mixed hyperlipidemia: Secondary | ICD-10-CM

## 2019-06-13 NOTE — Progress Notes (Signed)
MEDICARE ANNUAL WELLNESS VISIT AND FOLLOW UP Assessment:    Eric [name] was seen today for follow-up and medicare wellness.  Diagnoses and all orders for this visit:  Encounter for Medicare annual wellness exam  Atherosclerosis of aorta [facility]) Per imaging CT chest 06/2018 Control blood pressure, cholesterol, glucose, increase exercise.   Emphysema (HCC) Mild per imaging; hx of 50 pack/year smoking history; denies sx; monitor  Labile hypertension Controlled by lifestyle modification at this time Monitor blood pressure at home; call if consistently over 130/80 Continue DASH diet.   Reminder to go to the ER if any CP, SOB, nausea, dizziness, severe HA, changes vision/speech, left arm numbness and tingling and jaw pain.  Vitamin D deficiency Continue supplementation Check vitamin D level  Other abnormal glucose Recent A1Cs at goal Discussed diet/exercise, weight management  Defer A1C; check CMP -     COMPLETE METABOLIC PANEL WITH GFR  Medication management -     COMPLETE METABOLIC PANEL WITH GFR -     CBC with Differential/Platelet  Mixed hyperlipidemia Mild elevations treated by lifestyle modification Continue low cholesterol diet and exercise.  Check lipid panel.  -     Lipid panel -     TSH  Major depression, recurrent, controlled (HCC) Continue medications; recurrent depression; has failed attempts to taper  Lifestyle discussed: diet/exerise, sleep hygiene, stress management, hydration  Overweight  Long discussion about weight loss, diet, and exercise Recommended diet heavy in fruits and veggies and low in animal meats, cheeses, and dairy products, appropriate calorie intake Patient will work on reducing snacking/portion control, reducing alcohol intake Discussed appropriate weight for height (below 195lb) and initial goal (<215lb) Follow up at next visit  Excess alcohol intake  Discussed with patient; reduce alcohol intake, limit to 1-2 drinks per occasion; check  labs  Bilateral knee pain Currently managing fairly with lifestyle/PRN NSAID However concern with possible bone on bone; strongly recommend follow up next year; previously established with Dr. Collier Salina   Personal history of basal cell cancer Follows with dermatology   History of smoking greater than 50 pack years Quit in 2006; had normal CT chest 06/2018; monitor, CXR routinely    Over 30 minutes of exam, counseling, chart review, and critical decision making was performed  Future Appointments  Date Time Provider [place]  01/30/2020  3:00 PM Unk Pinto, MD [facility] None     Plan:   During the course of the visit the patient was educated and counseled about appropriate screening and preventive services including:    Pneumococcal vaccine   Influenza vaccine  Prevnar 13  Td vaccine  Screening electrocardiogram  Colorectal cancer screening  Diabetes screening  Glaucoma screening  Nutrition counseling    Subjective:  Eric [name] is a 68 y.o. male who presents for Medicare Annual Wellness Visit and 6 month follow up for HTN, hyperlipidemia, glucose management, and vitamin D Def.   He has hx of recurrent depression controlled on celexa 40 mg daily, has attempted tapers in the past unsuccessfully. 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   He was seeing Dr. Collier Salina for knees, is having some problems but manages with PRN ibuprofen and doing fairly.   He had basal cell cancer removed from R ear, following up with dermatology.   BMI is Body mass index is 29.95 kg/m., he has been working on diet and exercise, but admits he snacks a lot. He walks and does strength training 1 hour 4 days a week. He would  like to lose some weight, would like to get down 195 lb. He admits to drinking too much alcohol, 3 drinks/night, bourbon, snacks with this, knows he needs to reduce.  Wt Readings from Last 3 Encounters:  06/13/19 227 lb (103 kg)   01/04/19 227 lb 6.4 oz (103.1 kg)  06/08/18 228 lb 12.8 oz (103.8 kg)   He has 50 pack year smoking history; quit in 2006; mild emphesematous changes per imaging; denies sx He has aortic atherosclerosis and coronary artery calcifications per imaging - CT chest 06/2018 His blood pressure has been controlled at home, today their BP is BP: 120/62 He does workout. He denies chest pain, shortness of breath, dizziness.   He is not on cholesterol medication and denies myalgias. His cholesterol is not at goal. The cholesterol last visit was:   Lab Results  Component Value Date   CHOL 174 01/04/2019   HDL 36 (L) 01/04/2019   LDLCALC 98 01/04/2019   TRIG 303 (H) 01/04/2019   CHOLHDL 4.8 01/04/2019   He has been working on diet and exercise for glucose management, and denies increased appetite, nausea, paresthesia of the feet, polydipsia, polyuria and visual disturbances. Last A1C in the office was:  Lab Results  Component Value Date   HGBA1C 5.4 01/04/2019   Last GFR Lab Results  Component Value Date   GFRNONAA 64 01/04/2019    Patient is on Vitamin D supplement.   Lab Results  Component Value Date   VD25OH 68 01/04/2019      Medication Review:     Current Outpatient Medications (Analgesics):  Marland Kitchen  BABY ASPIRIN PO, Take 81 mg by mouth daily.    Current Outpatient Medications (Other):  Marland Kitchen  Cholecalciferol (VITAMIN D PO), Take 10,000 Int'l Units by mouth daily. .  citalopram (CELEXA) 40 MG tablet, Take 1 tablet Daily for Mood  Allergies: Allergies  Allergen Reactions  . Lipitor [Atorvastatin]   . Lopid [Gemfibrozil]   . Penicillins     Current Problems (verified) has Labile hypertension; Abnormal glucose; Hyperlipidemia, mixed; Vitamin D deficiency; Major depression, recurrent, chronic ([place]); Medication management; Obesity (BMI 30.0-34.9); History of smoking greater than 50 pack years; Atherosclerosis of aorta ([place]); Emphysema of lung ([place]); Prediabetes; BPH with  obstruction/lower urinary tract symptoms; History of basal cell cancer; and Chronic pain of both knees on their problem list.  Screening Tests Immunization History  Administered Date(s) Administered  . Influenza Split 04/13/2013, 04/06/2014, 04/15/2015  . Influenza, High Dose Seasonal PF 04/23/2016, 05/10/2018, 04/23/2019  . Pneumococcal Conjugate-13 10/25/2015  . Pneumococcal Polysaccharide-23 12/25/2015  . Pneumococcal-Unspecified 06/22/2002  . Tdap 09/17/2011, 05/10/2018    Preventative care: Last colonoscopy: 2006 Cologuard: 12/2017   Prior vaccinations: TD or Tdap: 2019  Influenza: 04/2019  Pneumococcal: 2017 Prevnar13: 2017 Shingles/Zostavax: declines   Names of Other Physician/Practitioners you currently use: 1. [facility] here for primary care 2. [facility], eye doctor, last visit 2020, post cataracts, doing well 3. Dr. Johnnye Sima, dentist, last visit 2019, has full dentures 4. Dr. Joyice Faster, dermatology, last 2020, goes annually, basal cell history   Patient Care Team: Unk Pinto, MD as PCP - General (Internal Medicine) Richmond Campbell, MD as Consulting Physician (Gastroenterology)  Surgical: He  has a past surgical history that includes Knee arthroscopy (Left, 1988) and Cataract extraction, bilateral (Bilateral, 2019). Family His family history includes Alzheimer's disease in his mother; Colon polyps in his father; Diabetes in his mother; Heart disease in his father; Hypertension in his  father; Stroke in his father. Social history  He reports that he quit smoking about 14 years ago. His smoking use included cigarettes. He started smoking about 51 years ago. He has a 55.50 pack-year smoking history. He has never used smokeless tobacco. He reports current alcohol use. He reports that he does not use drugs.  MEDICARE WELLNESS OBJECTIVES: Physical activity:   Cardiac risk factors:   Depression/mood screen:    Depression screen [facility] 2/9 01/08/2019  Decreased Interest 0  Down, Depressed, Hopeless 0  PHQ - 2 Score 0    ADLs:  In your present state of health, do you have any difficulty performing the following activities: 06/13/2019 01/08/2019  Hearing? N N  Vision? N N  Difficulty concentrating or making decisions? - N  Walking or climbing stairs? - N  Dressing or bathing? - N  Doing errands, shopping? - N  Some recent data might be hidden     Cognitive Testing  Alert? Yes  Normal Appearance?Yes  Oriented to person? Yes  Place? Yes   Time? Yes  Recall of three objects?  Yes  Can perform simple calculations? Yes  Displays appropriate judgment?Yes  Can read the correct time from a watch face?Yes  EOL planning: Does Patient Have a Medical Advance Directive?: Yes Type of Advance Directive: Healthcare Power of Attorney, Living will Does patient want to make changes to medical advance directive?: No - Patient declined Copy of [place] in Chart?: No - copy requested   Objective:   Today's Vitals   06/13/19 1104  BP: 120/62  Pulse: 84  Temp: (!) 96.7 F (35.9 C)  SpO2: 98%  Weight: 227 lb (103 kg)  Height: 6\' 1"  (1.854 m)   Body mass index is 29.95 kg/m.  General appearance: alert, no distress, WD/WN, male HEENT: normocephalic, sclerae anicteric, TMs pearly; mask in place - oral exam deferred Neck: supple, no lymphadenopathy, no thyromegaly, no masses Heart: RRR, normal S1, S2, no murmurs Lungs: CTA bilaterally, no wheezes, rhonchi, or rales Abdomen: +bs, soft, non tender, non distended, no masses, no hepatomegaly, no splenomegaly Musculoskeletal: nontender, no swelling, some bil bony enlargement knees, R mild valgus deformity Extremities: no edema, no cyanosis, no clubbing Pulses: 2+ symmetric, upper and lower extremities, normal cap refill Neurological: alert, oriented x 3, CN2-12 intact, strength normal upper extremities and lower extremities, sensation  normal throughout, DTRs 2+ throughout, no cerebellar signs, gait normal Psychiatric: normal affect, behavior normal, pleasant   Medicare Attestation I have personally reviewed: The patient's medical and social history Their use of alcohol, tobacco or illicit drugs Their current medications and supplements The patient's functional ability including ADLs,fall risks, home safety risks, cognitive, and hearing and visual impairment Diet and physical activities Evidence for depression or mood disorders  The patient's weight, height, BMI, and visual acuity have been recorded in the chart.  I have made referrals, counseling, and provided education to the patient based on review of the above and I have provided the patient with a written personalized care plan for preventive services.     Izora Ribas, NP   06/13/2019

## 2019-06-13 NOTE — Patient Instructions (Addendum)
Eric [name] , Thank you for taking time to come for your Medicare Wellness Visit. I appreciate your ongoing commitment to your health goals. Please review the following plan we discussed and let me know if I can assist you in the future.   These are the goals we discussed: Goals    . DIET - INCREASE WATER INTAKE     65-80+ fluid ounces of clear fluids    . Reduce alcohol intake     Limit to 1-2 per occasion.     . Weight (lb) < 215 lb (97.5 kg)       This is a list of the screening recommended for you and due dates:  Health Maintenance  Topic Date Due  . Cologuard (Stool DNA test)  12/27/2020  . Tetanus Vaccine  05/10/2028  . Flu Shot  Completed  . Pneumonia vaccines  Completed  .  Hepatitis C: One time screening is recommended by Center for Disease Control  (CDC) for  adults born from [birth year] through 1966.   Discontinued    Recommend not waiting too long for ortho or imaging follow up - postponing too long makes joint replacement surgery more difficulty   Work on weight loss in the meantime      Chronic Knee Pain, Adult Chronic knee pain is pain in one or both knees that lasts longer than 3 months. Symptoms of chronic knee pain may include swelling, stiffness, and discomfort. Age-related wear and tear (osteoarthritis) of the knee joint is the most common cause of chronic knee pain. Other possible causes include:  A long-term immune-related disease that causes inflammation of the knee (rheumatoid arthritis). This usually affects both knees.  Inflammatory arthritis, such as gout or pseudogout.  An injury to the knee that causes arthritis.  An injury to the knee that damages the ligaments. Ligaments are strong tissues that connect bones to each other.  Runner's knee or pain behind the kneecap. Treatment for chronic knee pain depends on the cause. The main treatments for chronic knee pain are physical therapy and weight loss. This condition may also be treated with medicines,  injections, a knee sleeve or brace, and by using crutches. Rest, ice, compression (pressure), and elevation (RICE) therapy may also be recommended. Follow these instructions at home: If you have a knee sleeve or brace:   Wear it as told by your health care provider. Remove it only as told by your health care provider.  Loosen it if your toes tingle, become numb, or turn cold and blue.  Keep it clean.  If the sleeve or brace is not waterproof: ? Do not let it get wet. ? Remove it if allowed by your health care provider, or cover it with a watertight covering when you take a bath or a shower. Managing pain, stiffness, and swelling      If directed, apply heat to the affected area as often as told by your health care provider. Use the heat source that your health care provider recommends, such as a moist heat pack or a heating pad. ? If you have a removable sleeve or brace, remove it as told by your health care provider. ? Place a towel between your skin and the heat source. ? Leave the heat on for 20-30 minutes. ? Remove the heat if your skin turns bright red. This is especially important if you are unable to feel pain, heat, or cold. You may have a greater risk of getting burned.  If directed, put  ice on the affected area. ? If you have a removable sleeve or brace, remove it as told by your health care provider. ? Put ice in a plastic bag. ? Place a towel between your skin and the bag. ? Leave the ice on for 20 minutes, 2-3 times a day.  Move your toes often to reduce stiffness and swelling.  Raise (elevate) the injured area above the level of your heart while you are sitting or lying down. Activity  Avoid activities where both feet leave the ground at the same time (high-impact activities). Examples are running, jumping rope, and doing jumping jacks.  Return to your normal activities as told by your health care provider. Ask your health care provider what activities are safe for  you.  Follow the exercise plan that your health care provider designed for you. Your health care provider may suggest that you: ? Avoid activities that make knee pain worse. This may require you to change your exercise routines, sport participation, or job duties. ? Wear shoes with cushioned soles. ? Avoid high-impact activities or sports that require running and sudden changes in direction. ? Do physical therapy as told by your health care provider. Physical therapy is planned to match your needs and abilities. It may include exercises for strength, flexibility, stability, and endurance. ? Do exercises that increase balance and strength, such as tai chi and yoga.  Do not use the injured limb to support your body weight until your health care provider says that you can. Use crutches, a cane, or a walker, as told by your health care provider. General instructions  Take over-the-counter and prescription medicines only as told by your health care provider.  Lose weight if you are overweight. Losing even a little weight can reduce knee pain. Ask your health care provider what your ideal weight is, and how to safely lose extra weight. A food expert (dietitian) may be able to help you plan your meals.  Do not use any products that contain nicotine or tobacco, such as cigarettes, e-cigarettes, and chewing tobacco. These can delay healing. If you need help quitting, ask your health care provider.  Keep all follow-up visits as told by your health care provider. This is important. Contact a health care provider if:  You have knee pain that is not getting better or gets worse.  You are unable to do your physical therapy exercises due to knee pain. Get help right away if:  Your knee swells and the swelling becomes worse.  You cannot move your knee.  You have severe knee pain. Summary  Knee pain that lasts more than 3 months is considered chronic knee pain.  The main treatments for chronic knee  pain are physical therapy and weight loss. You may also need to take medicines, wear a knee sleeve or brace, use crutches, and apply ice or heat.  Losing even a little weight can reduce knee pain. Ask your health care provider what your ideal weight is, and how to safely lose extra weight. A food expert (dietitian) may be able to help you plan your meals.  Work with a physical therapist to make a safe exercise program, as told by your health care provider. This information is not intended to replace advice given to you by your health care provider. Make sure you discuss any questions you have with your health care provider. Document Released: 08/18/2018 Document Revised: 08/18/2018 Document Reviewed: 08/18/2018 Elsevier Patient Education  2020 [employer].

## 2019-06-14 LAB — COMPLETE METABOLIC PANEL WITH GFR
AG Ratio: 1.8 (calc) (ref 1.0–2.5)
ALT: 24 U/L (ref 9–46)
AST: 20 U/L (ref 10–35)
Albumin: 4.4 g/dL (ref 3.6–5.1)
Alkaline phosphatase (APISO): 80 U/L (ref 35–144)
BUN: 14 mg/dL (ref 7–25)
CO2: 28 mmol/L (ref 20–32)
Calcium: 9.8 mg/dL (ref 8.6–10.3)
Chloride: 101 mmol/L (ref 98–110)
Creat: 1.22 mg/dL (ref 0.70–1.25)
GFR, Est African American: 70 mL/min/{1.73_m2} (ref >=60)
GFR, Est Non African American: 60 mL/min/{1.73_m2} (ref >=60)
Globulin: 2.5 g/dL (calc) (ref 1.9–3.7)
Glucose, Bld: 125 mg/dL — ABNORMAL HIGH (ref 65–99)
Potassium: 4.2 mmol/L (ref 3.5–5.3)
Sodium: 138 mmol/L (ref 135–146)
Total Bilirubin: 0.9 mg/dL (ref 0.2–1.2)
Total Protein: 6.9 g/dL (ref 6.1–8.1)

## 2019-06-14 LAB — TSH: TSH: 1.66 mIU/L (ref 0.40–4.50)

## 2019-06-14 LAB — LIPID PANEL
Cholesterol: 181 mg/dL (ref <200)
HDL: 39 mg/dL — ABNORMAL LOW (ref >=40)
LDL Cholesterol (Calc): 96 mg/dL (calc)
Non-HDL Cholesterol (Calc): 142 mg/dL (calc) — ABNORMAL HIGH (ref <130)
Total CHOL/HDL Ratio: 4.6 (calc) (ref <5.0)
Triglycerides: 342 mg/dL — ABNORMAL HIGH (ref <150)

## 2019-06-14 LAB — CBC WITH DIFFERENTIAL/PLATELET
Absolute Monocytes: 437 cells/uL (ref 200–950)
Basophils Absolute: 30 cells/uL (ref 0–200)
Basophils Relative: 0.5 %
Eosinophils Absolute: 47 cells/uL (ref 15–500)
Eosinophils Relative: 0.8 %
HCT: 46.8 % (ref 38.5–50.0)
Hemoglobin: 16.4 g/dL (ref 13.2–17.1)
Lymphs Abs: 1994 cells/uL (ref 850–3900)
MCH: 35.1 pg — ABNORMAL HIGH (ref 27.0–33.0)
MCHC: 35 g/dL (ref 32.0–36.0)
MCV: 100.2 fL — ABNORMAL HIGH (ref 80.0–100.0)
MPV: 9.3 fL (ref 7.5–12.5)
Monocytes Relative: 7.4 %
Neutro Abs: 3393 cells/uL (ref 1500–7800)
Neutrophils Relative %: 57.5 %
Platelets: 272 10*3/uL (ref 140–400)
RBC: 4.67 10*6/uL (ref 4.20–5.80)
RDW: 11.5 % (ref 11.0–15.0)
Total Lymphocyte: 33.8 %
WBC: 5.9 10*3/uL (ref 3.8–10.8)

## 2019-06-14 LAB — MAGNESIUM: Magnesium: 2 mg/dL (ref 1.5–2.5)

## 2019-06-28 ENCOUNTER — Encounter: Payer: Self-pay | Admitting: Adult Health

## 2019-06-28 DIAGNOSIS — U071 COVID-19: Secondary | ICD-10-CM | POA: Insufficient documentation

## 2020-01-16 ENCOUNTER — Other Ambulatory Visit: Payer: Self-pay

## 2020-01-22 ENCOUNTER — Other Ambulatory Visit: Payer: Self-pay | Admitting: Internal Medicine

## 2020-01-22 MED ORDER — CITALOPRAM HYDROBROMIDE 40 MG PO TABS: ORAL_TABLET | ORAL | 3 refills | Status: DC

## 2020-01-29 ENCOUNTER — Encounter: Payer: Self-pay | Admitting: Internal Medicine

## 2020-01-29 NOTE — Patient Instructions (Signed)

## 2020-01-29 NOTE — Progress Notes (Signed)
Annual  Screening/Preventative Visit  & Comprehensive Evaluation & Examination     This very nice 69 y.o.  MWM presents for a Screening /Preventative Visit & comprehensive evaluation and management of multiple medical co-morbidities.  Patient has been followed for HTN, HLD, Prediabetes and Vitamin D Deficiency.       Patient has hx/o labile HTN followed expectantly since 2002.  Patient's BP has been controlled at home.  Today's BP is at goal - 120/82. Patient denies any cardiac symptoms as chest pain, palpitations, shortness of breath, dizziness or ankle swelling.     Patient's hyperlipidemia is controlled with diet and medications. Patient denies myalgias or other medication SE's. Last lipids were at goal except elevated Trig's:  Lab Results  Component Value Date   CHOL 181 06/13/2019   HDL 39 (L) 06/13/2019   LDLCALC 96 06/13/2019   TRIG 342 (H) 06/13/2019   CHOLHDL 4.6 06/13/2019       Patient has hx/o prediabetes (A1c 5.8%/ 2013) and patient denies reactive hypoglycemic symptoms, visual blurring, diabetic polys or paresthesias. Last A1c was Normal & at goal:  Lab Results  Component Value Date   HGBA1C 5.4 01/04/2019        Finally, patient has history of Vitamin D Deficiency ("27"/ 2008) and last vitamin D was at goal:  Lab Results  Component Value Date   VD25OH 68 01/04/2019    Current Outpatient Medications on File Prior to Visit  Medication Sig  . Ascorbic Acid (VITAMIN C PO) Take 1 tablet by mouth daily.  Marland Kitchen BABY ASPIRIN PO Take 81 mg by mouth daily.   . Cholecalciferol (VITAMIN D PO) Take 10,000 Int'l Units by mouth daily.  . citalopram (CELEXA) 40 MG tablet Take 1 tablet Daily for Mood  . Multiple Vitamin (MULTIVITAMIN) tablet Take 1 tablet by mouth daily.   No current facility-administered medications on file prior to visit.   Allergies  Allergen Reactions  . Lipitor [Atorvastatin]   . Lopid [Gemfibrozil]   . Penicillins    Past Medical History:    Diagnosis Date  . Basal cell carcinoma   . Hyperlipidemia   . Labile hypertension   . Other testicular hypofunction   . Prediabetes   . Vitamin D deficiency    Health Maintenance  Topic Date Due  . COVID-19 Vaccine (1) Never done  . INFLUENZA VACCINE  01/21/2020  . Fecal DNA (Cologuard)  12/27/2020  . TETANUS/TDAP  05/10/2028  . PNA vac Low Risk Adult  Completed  . Hepatitis C Screening  Discontinued   Immunization History  Administered Date(s) Administered  . Influenza Split 04/13/2013, 04/06/2014, 04/15/2015  . Influenza, High Dose Seasonal PF 04/23/2016, 05/10/2018, 04/23/2019  . Pneumococcal Conjugate-13 10/25/2015  . Pneumococcal Polysaccharide-23 12/25/2015  . Pneumococcal-Unspecified 06/22/2002  . Tdap 09/17/2011, 05/10/2018   Last Colon - 08/01/2004 - Dr Earlean Shawl  Cologard - 12/27/2017 - Negative - recc 3 yr f/u due [id number]  Past Surgical History:  Procedure Laterality Date  . CATARACT EXTRACTION, BILATERAL Bilateral 2019  . KNEE ARTHROSCOPY Left 1988   Family History  Problem Relation Age of Onset  . Diabetes Mother   . Alzheimer's disease Mother   . Heart disease Father   . Hypertension Father   . Stroke Father   . Colon polyps Father    Social History   Socioeconomic History  . Marital status: Married    Spouse name: [name]   . Number of children: 1 daughter  Occupational History  . Retired Neurosurgeon  Tobacco Use  . Smoking status: Former Smoker    Packs/day: 1.50    Years: 37.00    Pack years: 55.50    Types: Cigarettes    Start date: 06/08/1968    Quit date: 10/10/2004    Years since quitting: 15.3  . Smokeless tobacco: Never Used  Substance and Sexual Activity  . Alcohol use: Yes    Alcohol/week: 21.0 standard drinks    Types: 21 Shots of liquor per week  . Drug use: No  . Sexual activity: Not on file     ROS Constitutional: Denies fever, chills, weight loss/gain, headaches, insomnia,  night sweats or change in appetite. Does c/o  fatigue. Eyes: Denies redness, blurred vision, diplopia, discharge, itchy or watery eyes.  ENT: Denies discharge, congestion, post nasal drip, epistaxis, sore throat, earache, hearing loss, dental pain, Tinnitus, Vertigo, Sinus pain or snoring.  Cardio: Denies chest pain, palpitations, irregular heartbeat, syncope, dyspnea, diaphoresis, orthopnea, PND, claudication or edema Respiratory: denies cough, dyspnea, DOE, pleurisy, hoarseness, laryngitis or wheezing.  Gastrointestinal: Denies dysphagia, heartburn, reflux, water brash, pain, cramps, nausea, vomiting, bloating, diarrhea, constipation, hematemesis, melena, hematochezia, jaundice or hemorrhoids Genitourinary: Denies dysuria, frequency, urgency, nocturia, hesitancy, discharge, hematuria or flank pain Musculoskeletal: Denies arthralgia, myalgia, stiffness, Jt. Swelling, pain, limp or strain/sprain. Denies Falls. Skin: Denies puritis, rash, hives, warts, acne, eczema or change in skin lesion Neuro: No weakness, tremor, incoordination, spasms, paresthesia or pain Psychiatric: Denies confusion, memory loss or sensory loss. Denies Depression. Endocrine: Denies change in weight, skin, hair change, nocturia, and paresthesia, diabetic polys, visual blurring or hyper / hypo glycemic episodes.  Heme/Lymph: No excessive bleeding, bruising or enlarged lymph nodes.  Physical Exam  BP 120/82   Pulse 84   Temp (!) 97.5 F (36.4 C)   Resp 16   Ht 6\' 1"  (1.854 m)   Wt 225 lb 9.6 oz (102.3 kg)   BMI 29.76 kg/m   General Appearance: Well nourished and well groomed and in no apparent distress.  Eyes: PERRLA, EOMs, conjunctiva no swelling or erythema, normal fundi and vessels. Sinuses: No frontal/maxillary tenderness ENT/Mouth: EACs patent / TMs  nl. Nares clear without erythema, swelling, mucoid exudates. Oral hygiene is good. No erythema, swelling, or exudate. Tongue normal, non-obstructing. Tonsils not swollen or erythematous. Hearing normal.  Neck:  Supple, thyroid not palpable. No bruits, nodes or JVD. Respiratory: Respiratory effort normal.  BS equal and clear bilateral without rales, rhonci, wheezing or stridor. Cardio: Heart sounds are normal with regular rate and rhythm and no murmurs, rubs or gallops. Peripheral pulses are normal and equal bilaterally without edema. No aortic or femoral bruits. Chest: symmetric with normal excursions and percussion.  Abdomen: Soft, with Nl bowel sounds. Nontender, no guarding, rebound, hernias, masses, or organomegaly.  Lymphatics: Non tender without lymphadenopathy.  Musculoskeletal: Full ROM all peripheral extremities, joint stability, 5/5 strength, and normal gait. Skin: Warm and dry without rashes, lesions, cyanosis, clubbing or  ecchymosis.  Neuro: Cranial nerves intact, reflexes equal bilaterally. Normal muscle tone, no cerebellar symptoms. Sensation intact.  Pysch: Alert and oriented X 3 with normal affect, insight and judgment appropriate.   Assessment and Plan  1. Annual Preventative/Screening Exam    2. Labile hypertension  - EKG 12-Lead - [place], RETROPERITNL ABD,  LTD - Urinalysis, Routine w reflex microscopic - Microalbumin / creatinine urine ratio - CBC with Differential/Platelet - COMPLETE METABOLIC PANEL WITH GFR - Magnesium - TSH  3. Hyperlipidemia, mixed  - EKG 12-Lead - [place], RETROPERITNL ABD,  LTD -  Lipid panel - TSH  4. Abnormal glucose  - EKG 12-Lead - [place], RETROPERITNL ABD,  LTD - Hemoglobin A1c - Insulin, random  5. Vitamin D deficiency  - VITAMIN D 25 Hydroxy   6. Prediabetes  - EKG 12-Lead - [place], RETROPERITNL ABD,  LTD - Hemoglobin A1c - Insulin, random  7. Screening for colorectal cancer  - POC Hemoccult Bld/Stl  8. BPH with obstruction/lower urinary tract symptoms  - PSA  9. Prostate cancer screening  - PSA  10. Encounter for screening for malignant neoplasm of respiratory organs  - CT CHEST LUNG CA SCREEN LOW DOSE W/O CM; Future  11.  History of smoking greater than 50 pack years  - CT CHEST LUNG CA SCREEN LOW DOSE W/O CM; Future  12. Screening for ischemic heart disease  - EKG 12-Lead  13. FHx: heart disease  - EKG 12-Lead - [place], RETROPERITNL ABD,  LTD  14. Former smoker  - EKG 12-Lead - [place], RETROPERITNL ABD,  LTD  15. Atherosclerosis of aorta ([place])  - [place], RETROPERITNL ABD,  LTD  16. Screening for AAA (aortic abdominal aneurysm)  - [place], RETROPERITNL ABD,  LTD  17. Medication management  - Urinalysis, Routine w reflex microscopic - Microalbumin / creatinine urine ratio - CBC with Differential/Platelet - COMPLETE METABOLIC PANEL WITH GFR - Magnesium - Lipid panel - TSH - Hemoglobin A1c - Insulin, random - VITAMIN D 25 Hydroxy        Patient was counseled in prudent diet, weight control to achieve/maintain BMI less than 25, BP monitoring, regular exercise and medications as discussed.  Discussed med effects and SE's. Routine screening labs and tests as requested with regular follow-up as recommended. Over 40 minutes of exam, counseling, chart review and high complex critical decision making was performed   Kirtland Bouchard, MD

## 2020-01-30 ENCOUNTER — Other Ambulatory Visit: Payer: Self-pay

## 2020-01-30 ENCOUNTER — Ambulatory Visit (INDEPENDENT_AMBULATORY_CARE_PROVIDER_SITE_OTHER): Payer: PPO | Admitting: Internal Medicine

## 2020-01-30 VITALS — BP 120/82 | HR 84 | Temp 97.5°F | Resp 16 | Ht 73.0 in | Wt 225.6 lb

## 2020-01-30 DIAGNOSIS — N138 Other obstructive and reflux uropathy: Secondary | ICD-10-CM | POA: Diagnosis not present

## 2020-01-30 DIAGNOSIS — N401 Enlarged prostate with lower urinary tract symptoms: Secondary | ICD-10-CM | POA: Diagnosis not present

## 2020-01-30 DIAGNOSIS — Z87891 Personal history of nicotine dependence: Secondary | ICD-10-CM | POA: Diagnosis not present

## 2020-01-30 DIAGNOSIS — E782 Mixed hyperlipidemia: Secondary | ICD-10-CM

## 2020-01-30 DIAGNOSIS — Z0000 Encounter for general adult medical examination without abnormal findings: Secondary | ICD-10-CM

## 2020-01-30 DIAGNOSIS — R7309 Other abnormal glucose: Secondary | ICD-10-CM | POA: Diagnosis not present

## 2020-01-30 DIAGNOSIS — Z125 Encounter for screening for malignant neoplasm of prostate: Secondary | ICD-10-CM | POA: Diagnosis not present

## 2020-01-30 DIAGNOSIS — E559 Vitamin D deficiency, unspecified: Secondary | ICD-10-CM | POA: Diagnosis not present

## 2020-01-30 DIAGNOSIS — Z1211 Encounter for screening for malignant neoplasm of colon: Secondary | ICD-10-CM

## 2020-01-30 DIAGNOSIS — I700 Atherosclerosis of aorta: Secondary | ICD-10-CM

## 2020-01-30 DIAGNOSIS — Z136 Encounter for screening for cardiovascular disorders: Secondary | ICD-10-CM | POA: Diagnosis not present

## 2020-01-30 DIAGNOSIS — R0989 Other specified symptoms and signs involving the circulatory and respiratory systems: Secondary | ICD-10-CM | POA: Diagnosis not present

## 2020-01-30 DIAGNOSIS — Z79899 Other long term (current) drug therapy: Secondary | ICD-10-CM

## 2020-01-30 DIAGNOSIS — Z0001 Encounter for general adult medical examination with abnormal findings: Secondary | ICD-10-CM

## 2020-01-30 DIAGNOSIS — Z8249 Family history of ischemic heart disease and other diseases of the circulatory system: Secondary | ICD-10-CM | POA: Diagnosis not present

## 2020-01-30 DIAGNOSIS — Z122 Encounter for screening for malignant neoplasm of respiratory organs: Secondary | ICD-10-CM

## 2020-01-30 DIAGNOSIS — Z1212 Encounter for screening for malignant neoplasm of rectum: Secondary | ICD-10-CM

## 2020-01-30 DIAGNOSIS — R7303 Prediabetes: Secondary | ICD-10-CM

## 2020-01-31 LAB — CBC WITH DIFFERENTIAL/PLATELET
Absolute Monocytes: 589 cells/uL (ref 200–950)
Basophils Absolute: 50 cells/uL (ref 0–200)
Basophils Relative: 0.8 %
Eosinophils Absolute: 68 cells/uL (ref 15–500)
Eosinophils Relative: 1.1 %
HCT: 45.4 % (ref 38.5–50.0)
Hemoglobin: 15.8 g/dL (ref 13.2–17.1)
Lymphs Abs: 2009 cells/uL (ref 850–3900)
MCH: 35.7 pg — ABNORMAL HIGH (ref 27.0–33.0)
MCHC: 34.8 g/dL (ref 32.0–36.0)
MCV: 102.5 fL — ABNORMAL HIGH (ref 80.0–100.0)
MPV: 9.8 fL (ref 7.5–12.5)
Monocytes Relative: 9.5 %
Neutro Abs: 3484 cells/uL (ref 1500–7800)
Neutrophils Relative %: 56.2 %
Platelets: 265 10*3/uL (ref 140–400)
RBC: 4.43 10*6/uL (ref 4.20–5.80)
RDW: 12.1 % (ref 11.0–15.0)
Total Lymphocyte: 32.4 %
WBC: 6.2 10*3/uL (ref 3.8–10.8)

## 2020-01-31 LAB — COMPLETE METABOLIC PANEL WITH GFR
AG Ratio: 1.8 (calc) (ref 1.0–2.5)
ALT: 23 U/L (ref 9–46)
AST: 22 U/L (ref 10–35)
Albumin: 4.4 g/dL (ref 3.6–5.1)
Alkaline phosphatase (APISO): 76 U/L (ref 35–144)
BUN: 14 mg/dL (ref 7–25)
CO2: 27 mmol/L (ref 20–32)
Calcium: 9.6 mg/dL (ref 8.6–10.3)
Chloride: 103 mmol/L (ref 98–110)
Creat: 1.21 mg/dL (ref 0.70–1.25)
GFR, Est African American: 70 mL/min/{1.73_m2} (ref >=60)
GFR, Est Non African American: 61 mL/min/{1.73_m2} (ref >=60)
Globulin: 2.4 g/dL (calc) (ref 1.9–3.7)
Glucose, Bld: 80 mg/dL (ref 65–99)
Potassium: 4.4 mmol/L (ref 3.5–5.3)
Sodium: 138 mmol/L (ref 135–146)
Total Bilirubin: 0.7 mg/dL (ref 0.2–1.2)
Total Protein: 6.8 g/dL (ref 6.1–8.1)

## 2020-01-31 LAB — TSH: TSH: 2.72 mIU/L (ref 0.40–4.50)

## 2020-01-31 LAB — INSULIN, RANDOM: Insulin: 43 u[IU]/mL — ABNORMAL HIGH

## 2020-01-31 LAB — VITAMIN D 25 HYDROXY (VIT D DEFICIENCY, FRACTURES): Vit D, 25-Hydroxy: 60 ng/mL (ref 30–100)

## 2020-01-31 LAB — URINALYSIS, ROUTINE W REFLEX MICROSCOPIC
Bilirubin Urine: NEGATIVE
Glucose, UA: NEGATIVE
Hgb urine dipstick: NEGATIVE
Ketones, ur: NEGATIVE
Leukocytes,Ua: NEGATIVE
Nitrite: NEGATIVE
Protein, ur: NEGATIVE
Specific Gravity, Urine: 1.022 (ref 1.001–1.03)
pH: 5.5 (ref 5.0–8.0)

## 2020-01-31 LAB — LIPID PANEL
Cholesterol: 173 mg/dL (ref <200)
HDL: 36 mg/dL — ABNORMAL LOW (ref >=40)
LDL Cholesterol (Calc): 93 mg/dL (calc)
Non-HDL Cholesterol (Calc): 137 mg/dL (calc) — ABNORMAL HIGH (ref <130)
Total CHOL/HDL Ratio: 4.8 (calc) (ref <5.0)
Triglycerides: 329 mg/dL — ABNORMAL HIGH (ref <150)

## 2020-01-31 LAB — PSA: PSA: 0.3 ng/mL (ref <=4.0)

## 2020-01-31 LAB — MICROALBUMIN / CREATININE URINE RATIO
Creatinine, Urine: 202 mg/dL (ref 20–320)
Microalb Creat Ratio: 3 mcg/mg creat (ref <30)
Microalb, Ur: 0.6 mg/dL

## 2020-01-31 LAB — HEMOGLOBIN A1C
Hgb A1c MFr Bld: 5.4 % of total Hgb (ref <5.7)
Mean Plasma Glucose: 108 (calc)
eAG (mmol/L): 6 (calc)

## 2020-01-31 LAB — MAGNESIUM: Magnesium: 2.1 mg/dL (ref 1.5–2.5)

## 2020-01-31 NOTE — Progress Notes (Signed)
===========================================================  -    PSA - Very Low - Great  ===========================================================  -  Total Chol = 173 and LDL = 93 - Both  Excellent   - Very low risk for Heart Attack  / Stroke =============================================================  - But...................  - Triglycerides (   329  ) or fats in blood are too high  (goal is less than 150)    - Recommend avoid fried & greasy foods,  sweets / candy,   - Avoid white rice  (brown or wild rice or Quinoa is OK),   - Avoid white potatoes  (sweet potatoes are OK)   - Avoid anything made from white flour  - bagels, doughnuts, rolls, buns, biscuits, white and   wheat breads, pizza crust and traditional  pasta made of white flour & egg white  - (vegetarian pasta or spinach or wheat pasta is OK).    - Multi-grain bread is OK - like multi-grain flat bread or  sandwich thins.   - Avoid alcohol in excess.   - Exercise is also important. ===========================================================  -  A1c - Normal  = No Diabetes ===========================================================  -  Vitamin D = 60 - Great  ===========================================================  -  All Else - CBC - Kidneys - Electrolytes - Liver - Magnesium & Thyroid    - all  Normal / OK ===========================================================

## 2020-02-08 IMAGING — CT CT CHEST W/O CM
1 series · 16 of 34 positions shown, 20 images · non-contrast
Comparison: Radiographs June 19, 2011.

CLINICAL DATA: Former heavy tobacco smoker.

EXAM:
CT CHEST WITHOUT CONTRAST
TECHNIQUE: Multidetector CT imaging of the chest was performed following the
standard protocol without IV contrast.

[Series 2: chest w/(date) · axial · 0.90mm/px · z∈[-336,-36]mm · 16 of 170 slices shown, 20 images]
[im 13/170  mediastinal]
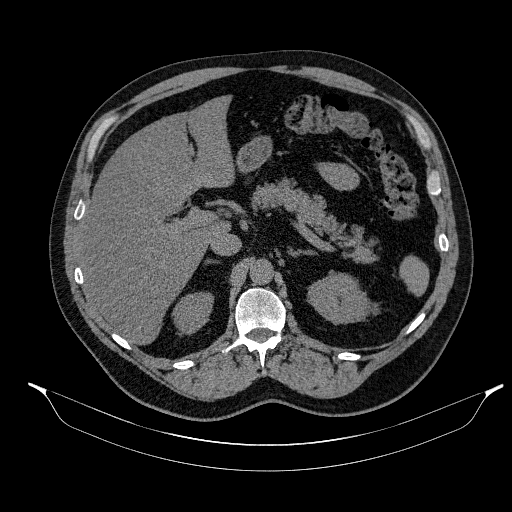
[im 13/170  lung]
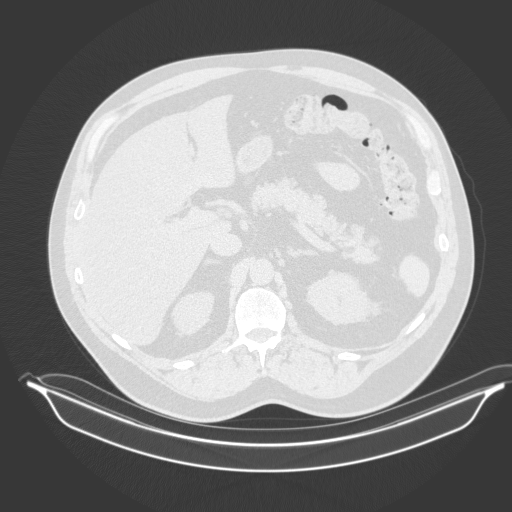
[im 26/170  lung]
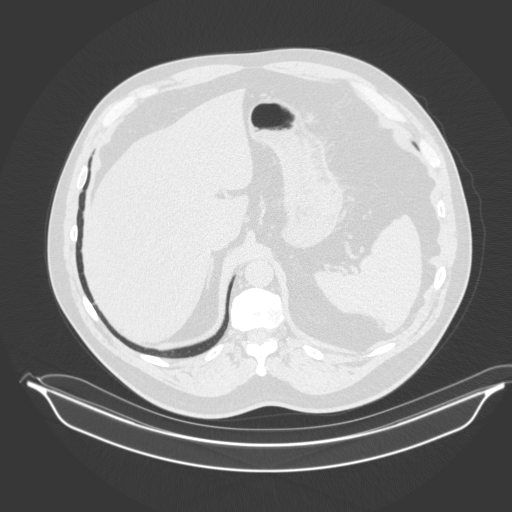
[im 34/170  lung]
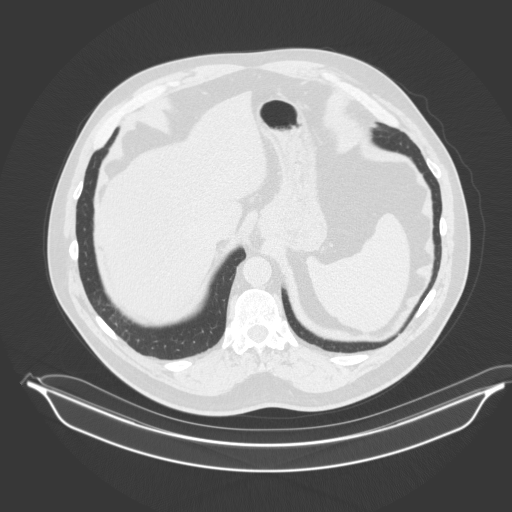
[im 44/170  lung]
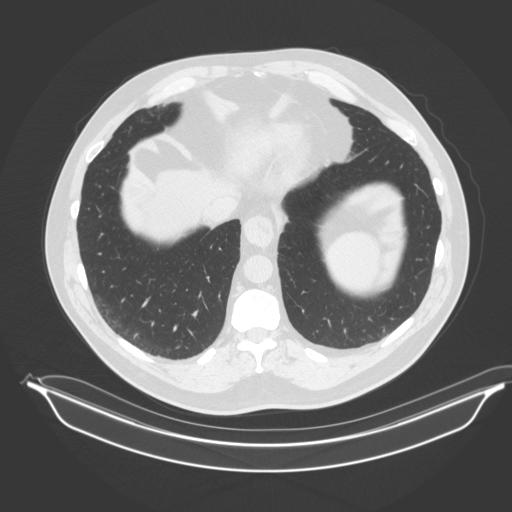
[im 57/170  mediastinal]
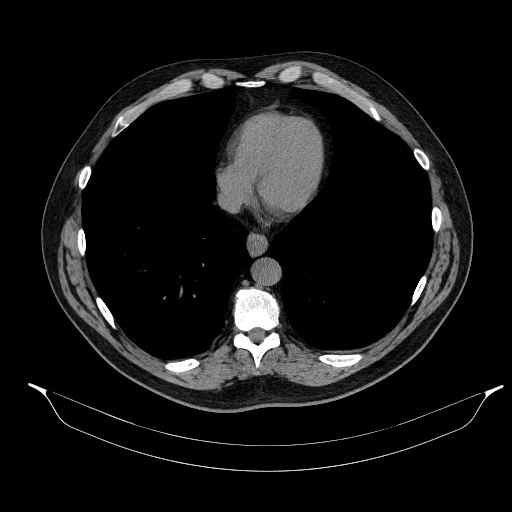
[im 57/170  lung]
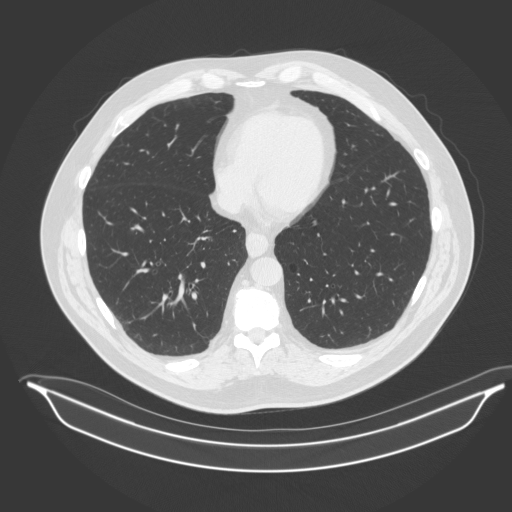
[im 68/170  lung]
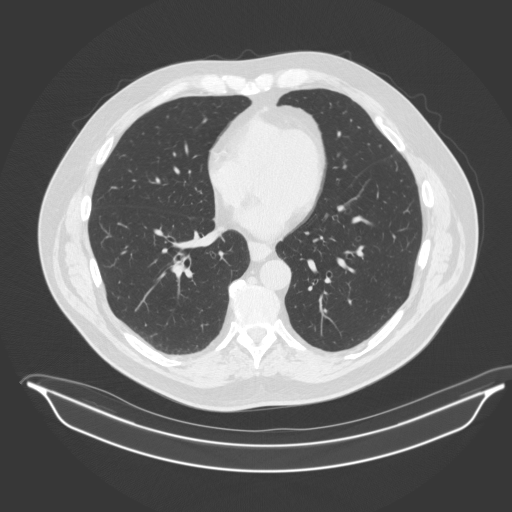
[im 76/170  lung]
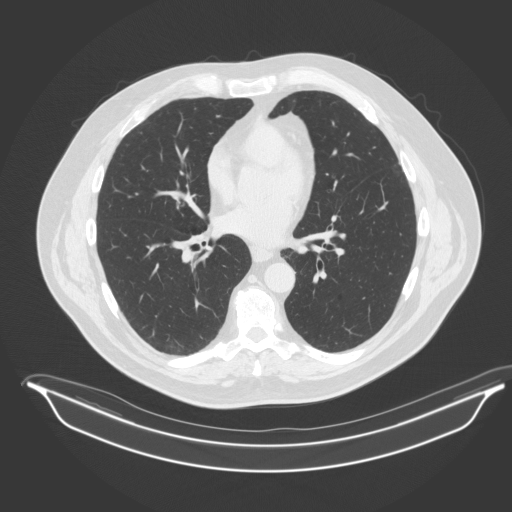
[im 82/170  lung]
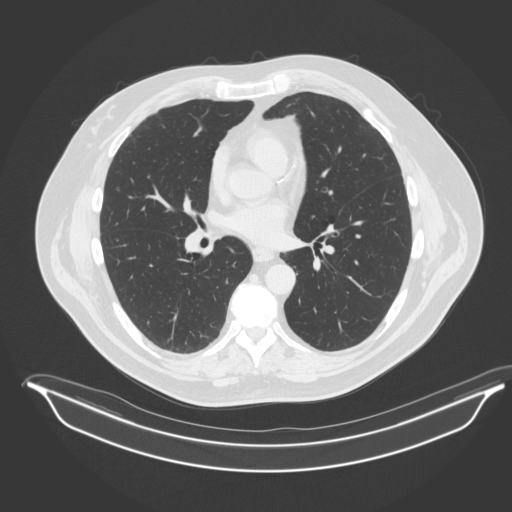
[im 90/170  mediastinal]
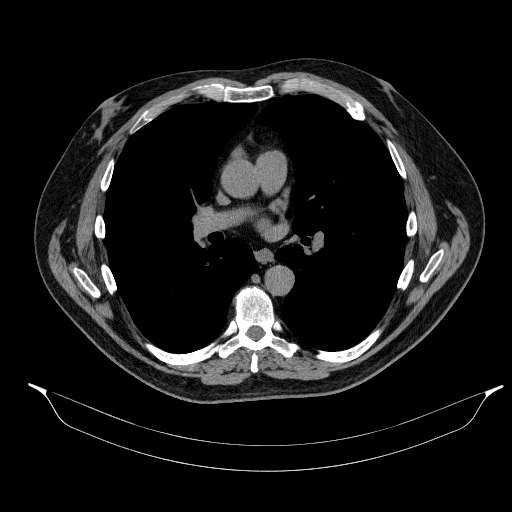
[im 90/170  lung]
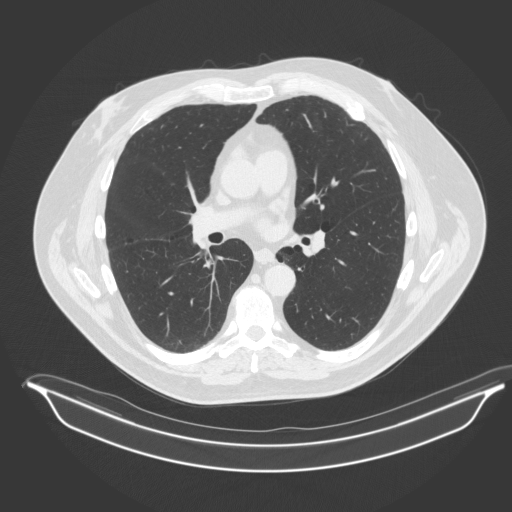
[im 101/170  lung]
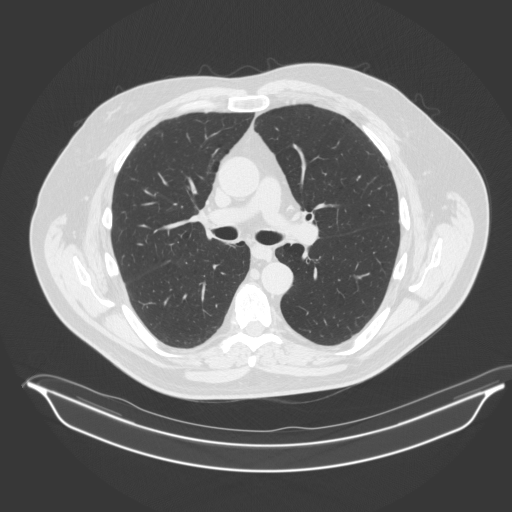
[im 107/170  lung]
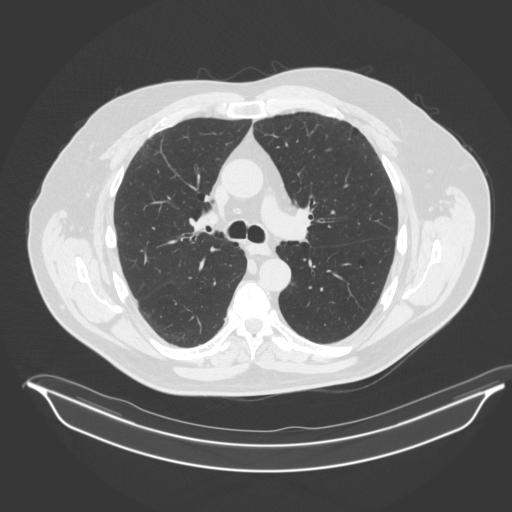
[im 119/170  lung]
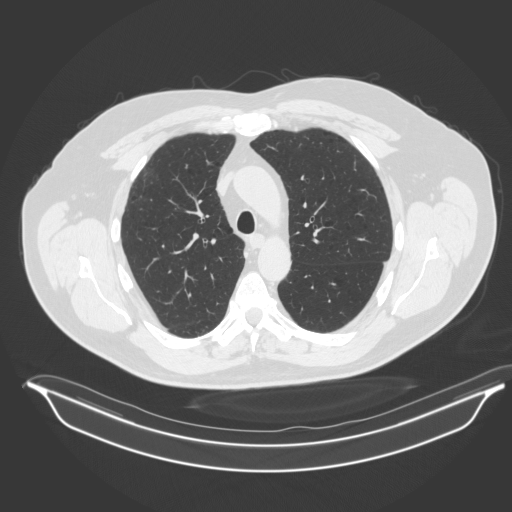
[im 132/170  mediastinal]
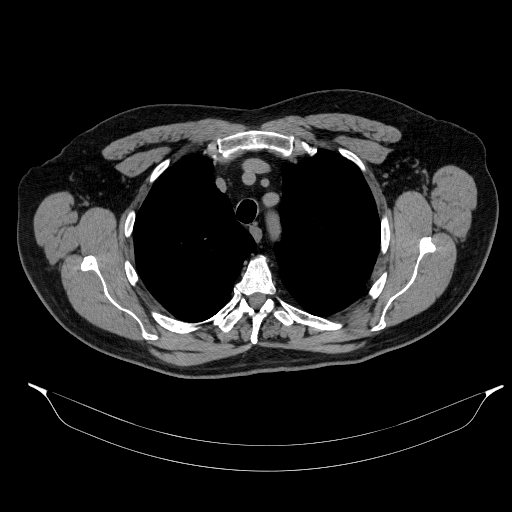
[im 132/170  lung]
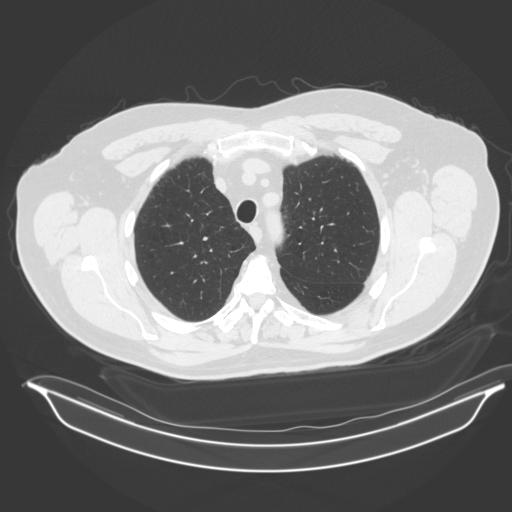
[im 138/170  lung]
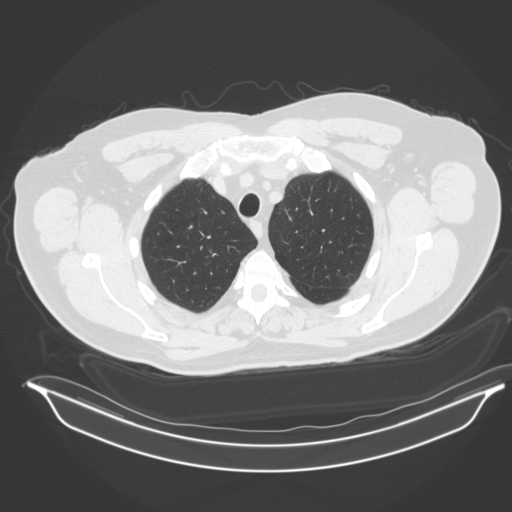
[im 151/170  lung]
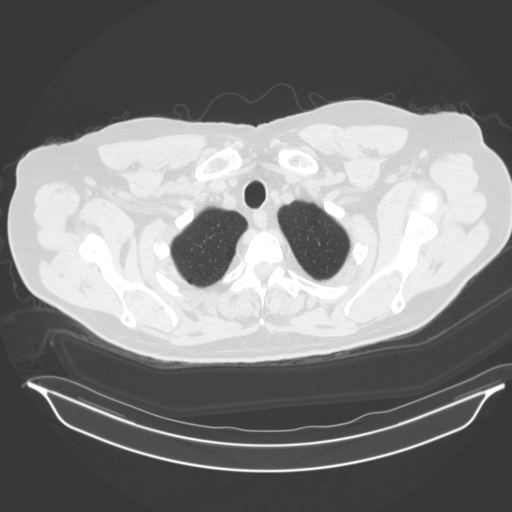
[im 163/170  lung]
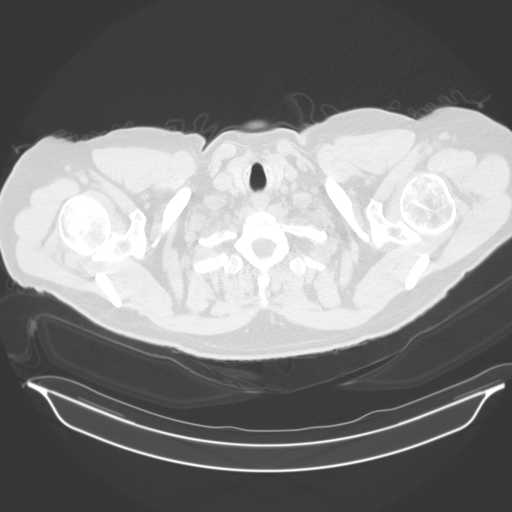

[16 of 34 positions shown; findings below may reference images not displayed]

FINDINGS: Cardiovascular: Coronary artery calcifications are noted.
Atherosclerosis of thoracic aorta is noted. Normal cardiac size. No
pericardial effusion. No evidence of thoracic aortic aneurysm.

Mediastinum/Nodes: Small sliding-type hiatal hernia is noted.
Thyroid gland is unremarkable. No adenopathy is noted.

Lungs/Pleura: No pneumothorax or pleural effusion is noted. Minimal
emphysematous disease is noted in the upper lobes bilaterally. No
consolidative process is noted. No mass or nodule is noted.

Upper Abdomen: No acute abnormality.

Musculoskeletal: No chest wall mass or suspicious bone lesions
identified.
IMPRESSION: Small sliding-type hiatal hernia.

No acute pulmonary abnormality is noted.

Aortic Atherosclerosis (KGRFE-M34.4) and Emphysema (KGRFE-5XL.2).

## 2020-05-29 DIAGNOSIS — Z85828 Personal history of other malignant neoplasm of skin: Secondary | ICD-10-CM | POA: Diagnosis not present

## 2020-05-29 DIAGNOSIS — L720 Epidermal cyst: Secondary | ICD-10-CM | POA: Diagnosis not present

## 2020-05-29 DIAGNOSIS — L281 Prurigo nodularis: Secondary | ICD-10-CM | POA: Diagnosis not present

## 2020-05-29 DIAGNOSIS — D0439 Carcinoma in situ of skin of other parts of face: Secondary | ICD-10-CM | POA: Diagnosis not present

## 2020-05-29 DIAGNOSIS — D485 Neoplasm of uncertain behavior of skin: Secondary | ICD-10-CM | POA: Diagnosis not present

## 2020-05-29 DIAGNOSIS — C44319 Basal cell carcinoma of skin of other parts of face: Secondary | ICD-10-CM | POA: Diagnosis not present

## 2020-05-29 DIAGNOSIS — M6740 Ganglion, unspecified site: Secondary | ICD-10-CM | POA: Diagnosis not present

## 2020-06-11 NOTE — Progress Notes (Signed)
MEDICARE ANNUAL WELLNESS VISIT AND FOLLOW UP Assessment:    Eric [name] was seen today for follow-up and medicare wellness.  Diagnoses and all orders for this visit:  Encounter for Medicare annual wellness exam  Atherosclerosis of aorta [facility]) Per imaging CT chest 06/2018 Control blood pressure, cholesterol, glucose, increase exercise.   Emphysema (HCC) Mild per imaging; hx of 50 pack/year smoking history; denies sx; monitor  Labile hypertension Controlled by lifestyle modification at this time Monitor blood pressure at home; call if consistently over 130/80 Continue DASH diet.   Reminder to go to the ER if any CP, SOB, nausea, dizziness, severe HA, changes vision/speech, left arm numbness and tingling and jaw pain.  Vitamin D deficiency Continue supplementation Check vitamin D level annually/PRN  Other abnormal glucose Recent A1Cs at goal Discussed diet/exercise, weight management  Defer A1C; check CMP -     COMPLETE METABOLIC PANEL WITH GFR  Medication management -     COMPLETE METABOLIC PANEL WITH GFR -     CBC with Differential/Platelet  Mixed hyperlipidemia Mild elevations - hx of atorvastatin, will try low dose low frequency rosuvastatin 5 mg once a week and evaluate  Continue low cholesterol diet and exercise.  Check lipid panel.  -     Lipid panel -     TSH  Major depression, recurrent, controlled (HCC) Continue medications; recurrent depression; has failed attempts to taper  Lifestyle discussed: diet/exerise, sleep hygiene, stress management, hydration  Obesity Long discussion about weight loss, diet, and exercise Recommended diet heavy in fruits and veggies and low in animal meats, cheeses, and dairy products, appropriate calorie intake Patient will work on reducing snacking/portion control, reducing alcohol intake Discussed appropriate weight for height (below 195lb) and initial goal (<215lb) Follow up at next visit  Excess alcohol intake Discussed with  patient; reduce alcohol intake, limit to 1-2 drinks per occasion; check labs  Bilateral knee pain  Bil knee arthritis per patient, concern with possible bone on bone;  More limiting this year, referral to ortho placed after discussion with patient   Personal history of basal cell cancer Follows with dermatology   History of smoking greater than 50 pack years Quit in 2006; had normal CT chest 06/2018, has been longer than 15 years since cessation, no longer meeting criteria for CT screening Denies concerning sx; monitor    Over 30 minutes of exam, counseling, chart review, and critical decision making was performed  Future Appointments  Date Time Provider [place]  01/29/2021  3:00 PM Unk Pinto, MD [facility] None  06/12/2021 11:00 AM Liane Comber, NP [facility] None     Plan:   During the course of the visit the patient was educated and counseled about appropriate screening and preventive services including:    Pneumococcal vaccine   Influenza vaccine  Prevnar 13  Td vaccine  Screening electrocardiogram  Colorectal cancer screening  Diabetes screening  Glaucoma screening  Nutrition counseling    Subjective:  Eric [name] is a 69 y.o. male who presents for Medicare Annual Wellness Visit and 6 month follow up for HTN, hyperlipidemia, glucose management, and vitamin D Def.   He has hx of recurrent depression controlled on celexa 40 mg daily, has attempted tapers in the past unsuccessfully. 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   He was formerly seeing Dr. Collier Salina for knees, has been advised bone on bone arthritis, has not seen ortho in several years, has been managing with ibuprofen, but more limiting this year,  progressive, interested in referral back.   He had basal cell cancer removed from R ear and nose, following up with dermatology.   BMI is Body mass index is 30.08 kg/m., he has been working on diet and exercise,  but admits he snacks a lot. He walks and does strength training 1 hour 4 days a week. He would like to lose some weight, would like to get down 195 lb. He admits to drinking too much alcohol, 3 drinks/night, bourbon, snacks with this, knows he needs to reduce.  Wt Readings from Last 3 Encounters:  06/12/20 228 lb (103.4 kg)  01/30/20 225 lb 9.6 oz (102.3 kg)  06/13/19 227 lb (103 kg)   He has 50 pack year smoking history; quit in 2006; mild emphesematous changes per imaging; denies sx He has aortic atherosclerosis and coronary artery calcifications per imaging - CT chest 06/2018 did not find any nodules recommended for further follow up  His blood pressure has been controlled at home, today their BP is BP: 118/72 He does workout. He denies chest pain, shortness of breath, dizziness.   He is not on cholesterol medication and denies myalgias. His cholesterol is not at goal. The cholesterol last visit was:   Lab Results  Component Value Date   CHOL 173 01/30/2020   HDL 36 (L) 01/30/2020   LDLCALC 93 01/30/2020   TRIG 329 (H) 01/30/2020   CHOLHDL 4.8 01/30/2020   He has been working on diet and exercise for glucose management, and denies increased appetite, nausea, paresthesia of the feet, polydipsia, polyuria and visual disturbances. Last A1C in the office was:  Lab Results  Component Value Date   HGBA1C 5.4 01/30/2020   Last GFR Lab Results  Component Value Date   GFRNONAA 61 01/30/2020    Patient is on Vitamin D supplement.   Lab Results  Component Value Date   VD25OH 60 01/30/2020      Medication Review:   Current Outpatient Medications (Cardiovascular):  .  rosuvastatin (CRESTOR) 5 MG tablet, Take 1 tab once a week for cholesterol and plaque on arteries.   Current Outpatient Medications (Analgesics):  Marland Kitchen  BABY ASPIRIN PO, Take 81 mg by mouth daily.    Current Outpatient Medications (Other):  Marland Kitchen  Ascorbic Acid (VITAMIN C PO), Take 1 tablet by mouth daily. .   Cholecalciferol (VITAMIN D PO), Take 10,000 Int'l Units by mouth daily. .  citalopram (CELEXA) 40 MG tablet, Take 1 tablet Daily for Mood .  Multiple Vitamin (MULTIVITAMIN) tablet, Take 1 tablet by mouth daily. .  Zinc 50 MG CAPS, Take by mouth daily.  Allergies: Allergies  Allergen Reactions  . Lipitor [Atorvastatin]   . Lopid [Gemfibrozil]   . Penicillins     Current Problems (verified) has Labile hypertension; Abnormal glucose; Hyperlipidemia, mixed; Vitamin D deficiency; Major depression, recurrent, chronic ([place]); Medication management; Obesity (BMI 30.0-34.9); History of smoking greater than 50 pack years; Atherosclerosis of aorta ([place]); Emphysema of lung ([place]); BPH with obstruction/lower urinary tract symptoms; History of basal cell cancer; and Chronic pain of both knees on their problem list.  Screening Tests Immunization History  Administered Date(s) Administered  . Influenza Split 04/13/2013, 04/06/2014, 04/15/2015  . Influenza, High Dose Seasonal PF 04/23/2016, 05/10/2018, 04/23/2019, 04/16/2020  . Moderna Sars-Covid-2 Vaccination 08/04/2019, 09/01/2019, 04/16/2020  . Pneumococcal Conjugate-13 10/25/2015  . Pneumococcal Polysaccharide-23 12/25/2015  . Pneumococcal-Unspecified 06/22/2002  . Tdap 09/17/2011, 05/10/2018    Preventative care: Last colonoscopy: 2006 Cologuard: 12/2017 due 2022  Prior vaccinations: TD  or Tdap: 2019  Influenza: 02/2020  Pneumococcal: 2017 Prevnar13: 2017 Shingles/Zostavax: declines  covid 19: 3/3, 2021, moderna  Names of Other Physician/Practitioners you currently use: 1. [facility] here for primary care 2. [facility], eye doctor, last visit 2020, post cataracts, doing well 3. Dr. Johnnye Sima, dentist, last visit 2020, has full dentures 4. Dr. Joyice Faster, dermatology, last 2021, goes annually, basal cell history   Patient Care Team: Unk Pinto, MD as PCP - General (Internal  Medicine) Richmond Campbell, MD as Consulting Physician (Gastroenterology)  Surgical: He  has a past surgical history that includes Knee arthroscopy (Left, 1988) and Cataract extraction, bilateral (Bilateral, 2019). Family His family history includes Alzheimer's disease in his mother; Colon polyps in his father; Diabetes in his mother; Heart disease in his father; Hypertension in his father; Stroke in his father. Social history  He reports that he quit smoking about 15 years ago. His smoking use included cigarettes. He started smoking about 52 years ago. He has a 55.50 pack-year smoking history. He has never used smokeless tobacco. He reports current alcohol use of about 21.0 standard drinks of alcohol per week. He reports that he does not use drugs.  MEDICARE WELLNESS OBJECTIVES: Physical activity: Current Exercise Habits: Home exercise routine, Type of exercise: walking;strength training/weights, Time (Minutes): 60, Frequency (Times/Week): 4, Weekly Exercise (Minutes/Week): 240, Intensity: Mild, Exercise limited by: orthopedic condition(s) Cardiac risk factors: Cardiac Risk Factors include: advanced age (>18men, >49 women);dyslipidemia;hypertension;male gender;obesity (BMI >30kg/m2) Depression/mood screen:   Depression screen [facility] 2/9 06/12/2020  Decreased Interest 0  Down, Depressed, Hopeless 0  PHQ - 2 Score 0  Altered sleeping 0  Tired, decreased energy 0  Change in appetite 0  Feeling bad or failure about yourself  0  Trouble concentrating 0  Moving slowly or fidgety/restless 0  Suicidal thoughts 0  PHQ-9 Score 0  Difficult doing work/chores Not difficult at all    ADLs:  In your present state of health, do you have any difficulty performing the following activities: 06/12/2020 01/29/2020  Hearing? N N  Vision? N N  Difficulty concentrating or making decisions? N N  Walking or climbing stairs? N N  Dressing or bathing? N N  Doing errands, shopping? N N  Some recent data might be  hidden     Cognitive Testing  Alert? Yes  Normal Appearance?Yes  Oriented to person? Yes  Place? Yes   Time? Yes  Recall of three objects?  Yes  Can perform simple calculations? Yes  Displays appropriate judgment?Yes  Can read the correct time from a watch face?Yes  EOL planning: Does Patient Have a Medical Advance Directive?: Yes Type of Advance Directive: [place] will Does patient want to make changes to medical advance directive?: No - Patient declined Copy of [place] in Chart?: No - copy requested   Objective:   Today's Vitals   06/12/20 1104  BP: 118/72  Pulse: 83  Temp: (!) 96.1 F (35.6 C)  SpO2: 98%  Weight: 228 lb (103.4 kg)   Body mass index is 30.08 kg/m.  General appearance: alert, no distress, WD/WN, male HEENT: normocephalic, sclerae anicteric, TMs pearly; mask in place - oral exam deferred Neck: supple, no lymphadenopathy, no thyromegaly, no masses Heart: RRR, normal S1, S2, no murmurs Lungs: CTA bilaterally, no wheezes, rhonchi, or rales Abdomen: +bs, soft, non tender, non distended, no masses, no hepatomegaly, no splenomegaly Musculoskeletal: nontender, no swelling, some bil bony enlargement knees, bil  crepitus, R mild valgus deformity  Extremities: no edema, no cyanosis, no clubbing  Pulses: 2+ symmetric, upper and lower extremities, normal cap refill Neurological: alert, oriented x 3, CN2-12 intact, strength normal upper extremities and lower extremities, sensation normal throughout, DTRs 2+ throughout, no cerebellar signs, gait normal Psychiatric: normal affect, behavior normal, pleasant   Medicare Attestation I have personally reviewed: The patient's medical and social history Their use of alcohol, tobacco or illicit drugs Their current medications and supplements The patient's functional ability including ADLs,fall risks, home safety risks, cognitive, and hearing and visual impairment Diet and  physical activities Evidence for depression or mood disorders  The patient's weight, height, BMI, and visual acuity have been recorded in the chart.  I have made referrals, counseling, and provided education to the patient based on review of the above and I have provided the patient with a written personalized care plan for preventive services.     Izora Ribas, NP   06/12/2020

## 2020-06-12 ENCOUNTER — Ambulatory Visit (INDEPENDENT_AMBULATORY_CARE_PROVIDER_SITE_OTHER): Payer: PPO | Admitting: Adult Health

## 2020-06-12 ENCOUNTER — Other Ambulatory Visit: Payer: Self-pay

## 2020-06-12 ENCOUNTER — Encounter: Payer: Self-pay | Admitting: Adult Health

## 2020-06-12 VITALS — BP 118/72 | HR 83 | Temp 96.1°F | Wt 228.0 lb

## 2020-06-12 DIAGNOSIS — M25562 Pain in left knee: Secondary | ICD-10-CM

## 2020-06-12 DIAGNOSIS — I700 Atherosclerosis of aorta: Secondary | ICD-10-CM

## 2020-06-12 DIAGNOSIS — E782 Mixed hyperlipidemia: Secondary | ICD-10-CM | POA: Diagnosis not present

## 2020-06-12 DIAGNOSIS — Z79899 Other long term (current) drug therapy: Secondary | ICD-10-CM | POA: Diagnosis not present

## 2020-06-12 DIAGNOSIS — Z85828 Personal history of other malignant neoplasm of skin: Secondary | ICD-10-CM

## 2020-06-12 DIAGNOSIS — J439 Emphysema, unspecified: Secondary | ICD-10-CM

## 2020-06-12 DIAGNOSIS — G8929 Other chronic pain: Secondary | ICD-10-CM

## 2020-06-12 DIAGNOSIS — F339 Major depressive disorder, recurrent, unspecified: Secondary | ICD-10-CM | POA: Diagnosis not present

## 2020-06-12 DIAGNOSIS — Z87891 Personal history of nicotine dependence: Secondary | ICD-10-CM | POA: Diagnosis not present

## 2020-06-12 DIAGNOSIS — R0989 Other specified symptoms and signs involving the circulatory and respiratory systems: Secondary | ICD-10-CM | POA: Diagnosis not present

## 2020-06-12 DIAGNOSIS — N401 Enlarged prostate with lower urinary tract symptoms: Secondary | ICD-10-CM

## 2020-06-12 DIAGNOSIS — E559 Vitamin D deficiency, unspecified: Secondary | ICD-10-CM

## 2020-06-12 DIAGNOSIS — Z0001 Encounter for general adult medical examination with abnormal findings: Secondary | ICD-10-CM | POA: Diagnosis not present

## 2020-06-12 DIAGNOSIS — R6889 Other general symptoms and signs: Secondary | ICD-10-CM | POA: Diagnosis not present

## 2020-06-12 DIAGNOSIS — E66811 Obesity, class 1: Secondary | ICD-10-CM

## 2020-06-12 DIAGNOSIS — R7309 Other abnormal glucose: Secondary | ICD-10-CM | POA: Diagnosis not present

## 2020-06-12 DIAGNOSIS — E669 Obesity, unspecified: Secondary | ICD-10-CM

## 2020-06-12 DIAGNOSIS — N138 Other obstructive and reflux uropathy: Secondary | ICD-10-CM

## 2020-06-12 DIAGNOSIS — Z0000 Encounter for general adult medical examination without abnormal findings: Secondary | ICD-10-CM

## 2020-06-12 MED ORDER — ROSUVASTATIN CALCIUM 5 MG PO TABS: ORAL_TABLET | ORAL | 3 refills | Status: DC

## 2020-06-12 NOTE — Patient Instructions (Signed)
Eric [name] , Thank you for taking time to come for your Medicare Wellness Visit. I appreciate your ongoing commitment to your health goals. Please review the following plan we discussed and let me know if I can assist you in the future.   These are the goals we discussed: Goals    . DIET - INCREASE WATER INTAKE     65-80+ fluid ounces of clear fluids    . Reduce alcohol intake     Limit to 1-2 per occasion.     . Weight (lb) < 215 lb (97.5 kg)       This is a list of the screening recommended for you and due dates:  Health Maintenance  Topic Date Due  . COVID-19 Vaccine (4 - Booster for Moderna series) 10/15/2020  . Cologuard (Stool DNA test)  12/27/2020  . Tetanus Vaccine  05/10/2028  . Flu Shot  Completed  . Pneumonia vaccines  Completed  .  Hepatitis C: One time screening is recommended by Center for Disease Control  (CDC) for  adults born from [birth year] through 1966.   Discontinued       Rosuvastatin capsules What is this medicine? ROSUVASTATIN (roe SOO va sta tin) is known as an HMG-CoA reductase inhibitor or 'statin'. It lowers cholesterol and triglycerides in the blood. Diet and lifestyle changes are often used with this drug. This medicine may be used for other purposes; ask your health care provider or pharmacist if you have questions. COMMON BRAND NAME(S): Ezallor What should I tell my health care provider before I take this medicine? They need to know if you have any of these conditions:  diabetes  if you often drink alcohol  history of stroke  kidney disease  liver disease  muscle aches or weakness  thyroid disease  an unusual or allergic reaction to rosuvastatin, other medicines, foods, dyes, or preservatives  pregnant or trying to get pregnant  breast-feeding How should I use this medicine? Take this medicine by mouth with a glass of water. Follow the directions on the prescription label. Swallow the capsules whole. Do not crush or chew this  medicine. You may open the capsule and put the contents in 1 teaspoon of applesauce, chocolate pudding, or vanilla pudding. Swallow the medicine with applesauce or pudding within 60 minutes (1 hour). Do not chew the medicine, applesauce, or pudding. You can take this medicine with or without food. Take your doses at regular intervals. Do not take your medicine more often than directed. Talk to your pediatrician about the use of this medicine in children. Special care may be needed. Overdosage: If you think you have taken too much of this medicine contact a poison control center or emergency room at once. NOTE: This medicine is only for you. Do not share this medicine with others. What if I miss a dose? If you miss a dose, take it as soon as you can. If your next dose is to be taken in less than 12 hours, then do not take the missed dose. Take the next dose at your regular time. Do not take double or extra doses. What may interact with this medicine? Do not take this medicine with any of the following medications:  herbal medicines like red yeast rice This medicine may also interact with the following medications:  alcohol  antacids containing aluminum hydroxide or magnesium hydroxide  cyclosporine  other medicines for high cholesterol  some medicines for HIV infection  warfarin This list may not describe all  possible interactions. Give your health care provider a list of all the medicines, herbs, non-prescription drugs, or dietary supplements you use. Also tell them if you smoke, drink alcohol, or use illegal drugs. Some items may interact with your medicine. What should I watch for while using this medicine? Visit your doctor or health care professional for regular check-ups. You may need regular tests to make sure your liver is working properly. Your health care professional may tell you to stop taking this medicine if you develop muscle problems. If your muscle problems do not go away  after stopping this medicine, contact your health care professional. Do not become pregnant while taking this medicine. Women should inform their health care professional if they wish to become pregnant or think they might be pregnant. There is a potential for serious side effects to an unborn child. Talk to your health care professional or pharmacist for more information. Do not breast-feed an infant while taking this medicine. This medicine may increase blood sugar. Ask your healthcare provider if changes in diet or medicines are needed if you have diabetes. If you are going to need surgery or other procedure, tell your doctor that you are using this medicine. This drug is only part of a total heart-health program. Your doctor or a dietician can suggest a low-cholesterol and low-fat diet to help. Avoid alcohol and smoking, and keep a proper exercise schedule. This medicine may cause a decrease in Co-Enzyme Q-10. You should make sure that you get enough Co-Enzyme Q-10 while you are taking this medicine. Discuss the foods you eat and the vitamins you take with your health care professional. What side effects may I notice from receiving this medicine? Side effects that you should report to your doctor or health care professional as soon as possible:  allergic reactions like skin rash, itching or hives, swelling of the face, lips, or tongue  dark urine  fever  joint pain  muscle cramps, pain  redness, blistering, peeling or loosening of the skin, including inside the mouth  signs and symptoms of high blood sugar such as being more thirsty or hungry or having to urinate more than normal. You may also feel very tired or have blurry vision.  trouble passing urine or change in the amount of urine  unusually weak  yellowing of the eyes or skin Side effects that usually do not require medical attention (report these to your doctor or health care professional if they continue or are  bothersome):  constipation  heartburn  nausea  stomach gas, pain, upset This list may not describe all possible side effects. Call your doctor for medical advice about side effects. You may report side effects to FDA at 1-800-FDA-1088. Where should I keep my medicine? Keep out of the reach of children. Store at room temperature between 20 and 25 degrees C (68 and 77 degrees F). Keep container tightly closed (protect from moisture). Throw away any unused medicine after the expiration date. NOTE: This sheet is a summary. It may not cover all possible information. If you have questions about this medicine, talk to your doctor, pharmacist, or health care provider.  2020 Elsevier/Gold Standard (2018-10-11 10:34:28)

## 2020-06-13 LAB — CBC WITH DIFFERENTIAL/PLATELET
Absolute Monocytes: 395 cells/uL (ref 200–950)
Basophils Absolute: 40 cells/uL (ref 0–200)
Basophils Relative: 0.6 %
Eosinophils Absolute: 67 cells/uL (ref 15–500)
Eosinophils Relative: 1 %
HCT: 48.3 % (ref 38.5–50.0)
Hemoglobin: 16.9 g/dL (ref 13.2–17.1)
Lymphs Abs: 2104 cells/uL (ref 850–3900)
MCH: 35.9 pg — ABNORMAL HIGH (ref 27.0–33.0)
MCHC: 35 g/dL (ref 32.0–36.0)
MCV: 102.5 fL — ABNORMAL HIGH (ref 80.0–100.0)
MPV: 9.6 fL (ref 7.5–12.5)
Monocytes Relative: 5.9 %
Neutro Abs: 4094 cells/uL (ref 1500–7800)
Neutrophils Relative %: 61.1 %
Platelets: 252 10*3/uL (ref 140–400)
RBC: 4.71 10*6/uL (ref 4.20–5.80)
RDW: 11.8 % (ref 11.0–15.0)
Total Lymphocyte: 31.4 %
WBC: 6.7 10*3/uL (ref 3.8–10.8)

## 2020-06-13 LAB — COMPLETE METABOLIC PANEL WITH GFR
AG Ratio: 2.1 (calc) (ref 1.0–2.5)
ALT: 32 U/L (ref 9–46)
AST: 31 U/L (ref 10–35)
Albumin: 4.6 g/dL (ref 3.6–5.1)
Alkaline phosphatase (APISO): 85 U/L (ref 35–144)
BUN/Creatinine Ratio: 10 (calc) (ref 6–22)
BUN: 12 mg/dL (ref 7–25)
CO2: 26 mmol/L (ref 20–32)
Calcium: 9.5 mg/dL (ref 8.6–10.3)
Chloride: 101 mmol/L (ref 98–110)
Creat: 1.19 mg/dL — ABNORMAL HIGH (ref 0.70–1.18)
GFR, Est African American: 71 mL/min/{1.73_m2} (ref >=60)
GFR, Est Non African American: 62 mL/min/{1.73_m2} (ref >=60)
Globulin: 2.2 g/dL (calc) (ref 1.9–3.7)
Glucose, Bld: 125 mg/dL — ABNORMAL HIGH (ref 65–99)
Potassium: 4.1 mmol/L (ref 3.5–5.3)
Sodium: 136 mmol/L (ref 135–146)
Total Bilirubin: 1 mg/dL (ref 0.2–1.2)
Total Protein: 6.8 g/dL (ref 6.1–8.1)

## 2020-06-13 LAB — MAGNESIUM: Magnesium: 2.1 mg/dL (ref 1.5–2.5)

## 2020-06-13 LAB — TSH: TSH: 2.17 mIU/L (ref 0.40–4.50)

## 2020-06-13 LAB — LIPID PANEL
Cholesterol: 156 mg/dL (ref <200)
HDL: 37 mg/dL — ABNORMAL LOW (ref >=40)
LDL Cholesterol (Calc): 82 mg/dL (calc)
Non-HDL Cholesterol (Calc): 119 mg/dL (calc) (ref <130)
Total CHOL/HDL Ratio: 4.2 (calc) (ref <5.0)
Triglycerides: 281 mg/dL — ABNORMAL HIGH (ref <150)

## 2020-06-20 ENCOUNTER — Other Ambulatory Visit: Payer: Self-pay | Admitting: Internal Medicine

## 2020-06-20 MED ORDER — AZITHROMYCIN 250 MG PO TABS: ORAL_TABLET | ORAL | 0 refills | Status: DC

## 2020-06-20 MED ORDER — PSEUDOEPHEDRINE HCL ER 120 MG PO TB12: ORAL_TABLET | ORAL | 0 refills | Status: DC

## 2020-06-20 MED ORDER — DEXAMETHASONE 4 MG PO TABS: ORAL_TABLET | ORAL | 0 refills | Status: DC

## 2020-07-03 DIAGNOSIS — Z85828 Personal history of other malignant neoplasm of skin: Secondary | ICD-10-CM | POA: Diagnosis not present

## 2020-07-03 DIAGNOSIS — C44319 Basal cell carcinoma of skin of other parts of face: Secondary | ICD-10-CM | POA: Diagnosis not present

## 2020-07-11 DIAGNOSIS — D0439 Carcinoma in situ of skin of other parts of face: Secondary | ICD-10-CM | POA: Diagnosis not present

## 2020-07-11 DIAGNOSIS — L723 Sebaceous cyst: Secondary | ICD-10-CM | POA: Diagnosis not present

## 2020-07-11 DIAGNOSIS — L720 Epidermal cyst: Secondary | ICD-10-CM | POA: Diagnosis not present

## 2020-07-11 DIAGNOSIS — Z85828 Personal history of other malignant neoplasm of skin: Secondary | ICD-10-CM | POA: Diagnosis not present

## 2020-08-28 DIAGNOSIS — M25561 Pain in right knee: Secondary | ICD-10-CM | POA: Diagnosis not present

## 2020-08-28 DIAGNOSIS — M25562 Pain in left knee: Secondary | ICD-10-CM | POA: Diagnosis not present

## 2020-09-11 NOTE — Progress Notes (Signed)
3 MONTH FOLLOW UP Assessment:    Atherosclerosis of aorta ([place]) Per imaging CT chest 06/2018 Control blood pressure, cholesterol, glucose, increase exercise.   Emphysema (HCC) Mild per imaging; hx of 50 pack/year smoking history; denies sx; monitor  Labile hypertension Controlled by lifestyle modification at this time Monitor blood pressure at home; call if consistently over 130/80 Continue DASH diet.   Reminder to go to the ER if any CP, SOB, nausea, dizziness, severe HA, changes vision/speech, left arm numbness and tingling and jaw pain.  Vitamin D deficiency  Continue supplementation Check vitamin D level annually/PRN  Other abnormal glucose Recent A1Cs at goal Discussed diet/exercise, weight management  Defer A1C; check CMP -     COMPLETE METABOLIC PANEL WITH GFR  Medication management -     COMPLETE METABOLIC PANEL WITH GFR -     CBC with Differential/Platelet  Mixed hyperlipidemia Mild elevations - newly on rosuvastatin for  LDL goal <70 due to mild plaque per CT Hx of myalgia with atorvastatin Continue low cholesterol diet and exercise.  Check lipid panel.  -     Lipid panel -     TSH  Major depression, recurrent, controlled (HCC) Continue medications; recurrent depression; has failed attempts to taper  Lifestyle discussed: diet/exerise, sleep hygiene, stress management, hydration  Obesity Long discussion about weight loss, diet, and exercise Recommended diet heavy in fruits and veggies and low in animal meats, cheeses, and dairy products, appropriate calorie intake Patient will work on reducing snacking/portion control, increase exercise  Discussed appropriate weight for height (below 195lb) and initial goal (<215lb) Follow up at next visit  Excess alcohol intake Has reduced intake, avoiding drinking daily, max 2 drinks per episode  Bilateral knee pain - osteoarthritis Bil knee arthritis, upcoming L TKA by Dr. Wynelle Link 11/2020  Preop clearance completed  today; Check EKG, CBC, CMP, A1C, UA, PT/INR per ortho request.  CXR ordered  BMI <40, not current smoker; no active cardiac disease, denies dyspnea or angina sx Hold ASA 7 days prior to procedure and restart the following day Recommendations:  DVT prophylaxis, monitoring of blood sugars and blood pressure post operatively. A cardiac clearance is not recommended.    History of smoking greater than 50 pack years Quit in 2006, has been longer than 15 years since cessation, had normal CT chest 06/2018; denies resp sx.    Over 30 minutes of exam, counseling, chart review, and critical decision making was performed  Future Appointments  Date Time Provider [place]  11/14/2020 10:00 AM [facility] PAT 1 [facility] None  01/29/2021  3:00 PM Unk Pinto, MD [facility] None  06/12/2021 11:00 AM Liane Comber, NP [facility] None     Subjective:  Eric [name] is a 70 y.o. male who presents for 3 month follow up for HTN, hyperlipidemia, glucose management, and vitamin D Def.   Also needing surgical clearance for L TKA, planning 11/25/2020 by Dr. Wynelle Link, will need R knee replaced later this year.   He has hx of recurrent depression controlled on celexa 40 mg daily, has attempted tapers in the past unsuccessfully. 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   He had basal cell cancer removed from R ear and nose, following up with dermatology.   BMI is Body mass index is 30.34 kg/m., he has been working on diet and exercise, but admits walking less with knee pain, still doing upper body  He would like to lose some weight, would like to get down 195  lb after surgery.  He did reduce alcohol intake after last visit.  Wt Readings from Last 3 Encounters:  09/12/20 230 lb (104.3 kg)  06/12/20 228 lb (103.4 kg)  01/30/20 225 lb 9.6 oz (102.3 kg)   He has 50 pack year smoking history; quit in 2006; mild emphesematous changes per imaging; denies sx He has aortic atherosclerosis  and coronary artery calcifications per imaging - CT chest 06/2018 did not find any nodules recommended for further follow up. Denies night sweats, fatigue, back pain, resp sx.    His blood pressure has been controlled at home, today their BP is BP: 122/80 He does workout. He denies chest pain, shortness of breath, dizziness.   He is on cholesterol medication (newly rosuvastatin 5 mg once weekly, hx of myalgia with atorvastatin) and denies myalgias. His cholesterol is at goal. The cholesterol last visit was:   Lab Results  Component Value Date   CHOL 156 06/12/2020   HDL 37 (L) 06/12/2020   LDLCALC 82 06/12/2020   TRIG 281 (H) 06/12/2020   CHOLHDL 4.2 06/12/2020   He has been working on diet and exercise for glucose management, and denies increased appetite, nausea, paresthesia of the feet, polydipsia, polyuria and visual disturbances. Last A1C in the office was:  Lab Results  Component Value Date   HGBA1C 5.4 01/30/2020   Last GFR Lab Results  Component Value Date   GFRNONAA 62 06/12/2020    Patient is on Vitamin D supplement.   Lab Results  Component Value Date   VD25OH 60 01/30/2020       Medication Review:  Current Outpatient Medications (Endocrine & Metabolic):  .  dexamethasone (DECADRON) 4 MG tablet, Take 1 tab 3 x /day for 2 days,      then 2 x /day for 2  Days,     then 1 tab daily  Current Outpatient Medications (Cardiovascular):  .  rosuvastatin (CRESTOR) 5 MG tablet, Take 1 tab once a week for cholesterol and plaque on arteries.  Current Outpatient Medications (Respiratory):  .  pseudoephedrine (SUDAFED) 120 MG 12 hr tablet, Take      1 tablet      2 x /day (every 12 hours)          for Head and Chest Congestion (Patient taking differently: as needed. Take      1 tablet      2 x /day (every 12 hours)          for Head and Chest Congestion)  Current Outpatient Medications (Analgesics):  Marland Kitchen  BABY ASPIRIN PO, Take 81 mg by mouth daily.    Current Outpatient  Medications (Other):  Marland Kitchen  Ascorbic Acid (VITAMIN C PO), Take 1 tablet by mouth daily. .  Cholecalciferol (VITAMIN D PO), Take 10,000 Int'l Units by mouth daily. .  citalopram (CELEXA) 40 MG tablet, Take 1 tablet Daily for Mood .  Multiple Vitamin (MULTIVITAMIN) tablet, Take 1 tablet by mouth daily. .  Vitamin E LIQD, by Does not apply route daily. .  Zinc 50 MG CAPS, Take by mouth daily. Marland Kitchen  azithromycin (ZITHROMAX) 250 MG tablet, Take 2 tablets with Food on  Day 1, then 1 tablet Daily with Food for Infection  Allergies: Allergies  Allergen Reactions  . Lipitor [Atorvastatin]   . Lopid [Gemfibrozil]   . Penicillins     Current Problems (verified) has Labile hypertension; Abnormal glucose; Hyperlipidemia, mixed; Vitamin D deficiency; Major depression, recurrent, chronic ([place]); Medication management; Obesity (BMI 30.0-34.9); History  of smoking greater than 50 pack years (quit 2006); Atherosclerosis of aorta ([place]); Emphysema of lung ([place]); BPH with obstruction/lower urinary tract symptoms; History of basal cell cancer; and Chronic pain of both knees on their problem list.  Surgical: He  has a past surgical history that includes Knee arthroscopy (Left, 1988) and Cataract extraction, bilateral (Bilateral, 2019). Family His family history includes Alzheimer's disease in his mother; Colon polyps in his father; Diabetes in his mother; Heart disease in his father; Hypertension in his father; Stroke in his father. Social history  He reports that he quit smoking about 15 years ago. His smoking use included cigarettes. He started smoking about 52 years ago. He has a 55.50 pack-year smoking history. He has never used smokeless tobacco. He reports current alcohol use of about 14.0 standard drinks of alcohol per week. He reports that he does not use drugs.  Review of Systems  Constitutional: Negative for malaise/fatigue and weight loss.  HENT: Negative for hearing loss and tinnitus.   Eyes: Negative for  blurred vision and double vision.  Respiratory: Negative for cough, shortness of breath and wheezing.   Cardiovascular: Negative for chest pain, palpitations, orthopnea, claudication and leg swelling.  Gastrointestinal: Negative for abdominal pain, blood in stool, constipation, diarrhea, heartburn, melena, nausea and vomiting.  Genitourinary: Negative.   Musculoskeletal: Positive for joint pain (knee). Negative for myalgias.  Skin: Negative for rash.  Neurological: Negative for dizziness, tingling, sensory change, weakness and headaches.  Endo/Heme/Allergies: Negative for polydipsia.  Psychiatric/Behavioral: Negative.   All other systems reviewed and are negative.    Objective:   Today's Vitals   09/12/20 1129  BP: 122/80  Pulse: 92  Temp: (!) 97.1 F (36.2 C)  SpO2: 97%  Weight: 230 lb (104.3 kg)   Body mass index is 30.34 kg/m.  General appearance: alert, no distress, WD/WN, male HEENT: normocephalic, sclerae anicteric, TMs pearly; mask in place - oral exam deferred Neck: supple, no lymphadenopathy, no thyromegaly, no masses Heart: RRR, normal S1, S2, no murmurs Lungs: CTA bilaterally, no wheezes, rhonchi, or rales Abdomen: +bs, soft, non tender, non distended, no masses, no hepatomegaly, no splenomegaly Musculoskeletal: nontender, no swelling, some bil bony enlargement knees, bil crepitus, R mild valgus deformity Extremities: no edema, no cyanosis, no clubbing  Pulses: 2+ symmetric, upper and lower extremities, normal cap refill Neurological: alert, oriented x 3, CN2-12 intact, strength normal upper extremities and lower extremities, sensation normal throughout, DTRs 2+ throughout, no cerebellar signs, gait normal Psychiatric: normal affect, behavior normal, pleasant    Izora Ribas, NP   09/12/2020

## 2020-09-12 ENCOUNTER — Ambulatory Visit (INDEPENDENT_AMBULATORY_CARE_PROVIDER_SITE_OTHER): Payer: PPO | Admitting: Adult Health

## 2020-09-12 ENCOUNTER — Other Ambulatory Visit: Payer: Self-pay

## 2020-09-12 ENCOUNTER — Encounter: Payer: Self-pay | Admitting: Adult Health

## 2020-09-12 ENCOUNTER — Ambulatory Visit
Admission: RE | Admit: 2020-09-12 | Discharge: 2020-09-12 | Disposition: A | Discharge status: Home / Self Care | Payer: Medicare Other | Source: Ambulatory Visit | Attending: Adult Health | Admitting: Adult Health

## 2020-09-12 ENCOUNTER — Other Ambulatory Visit: Payer: Self-pay | Admitting: Adult Health

## 2020-09-12 VITALS — BP 122/80 | HR 92 | Temp 97.1°F | Wt 230.0 lb

## 2020-09-12 DIAGNOSIS — Z87891 Personal history of nicotine dependence: Secondary | ICD-10-CM | POA: Diagnosis not present

## 2020-09-12 DIAGNOSIS — E559 Vitamin D deficiency, unspecified: Secondary | ICD-10-CM | POA: Diagnosis not present

## 2020-09-12 DIAGNOSIS — Z125 Encounter for screening for malignant neoplasm of prostate: Secondary | ICD-10-CM | POA: Diagnosis not present

## 2020-09-12 DIAGNOSIS — E669 Obesity, unspecified: Secondary | ICD-10-CM

## 2020-09-12 DIAGNOSIS — N401 Enlarged prostate with lower urinary tract symptoms: Secondary | ICD-10-CM | POA: Diagnosis not present

## 2020-09-12 DIAGNOSIS — J439 Emphysema, unspecified: Secondary | ICD-10-CM

## 2020-09-12 DIAGNOSIS — N138 Other obstructive and reflux uropathy: Secondary | ICD-10-CM | POA: Diagnosis not present

## 2020-09-12 DIAGNOSIS — Z79899 Other long term (current) drug therapy: Secondary | ICD-10-CM

## 2020-09-12 DIAGNOSIS — R0989 Other specified symptoms and signs involving the circulatory and respiratory systems: Secondary | ICD-10-CM

## 2020-09-12 DIAGNOSIS — N320 Bladder-neck obstruction: Secondary | ICD-10-CM | POA: Diagnosis not present

## 2020-09-12 DIAGNOSIS — R7309 Other abnormal glucose: Secondary | ICD-10-CM | POA: Diagnosis not present

## 2020-09-12 DIAGNOSIS — E782 Mixed hyperlipidemia: Secondary | ICD-10-CM | POA: Diagnosis not present

## 2020-09-12 DIAGNOSIS — D649 Anemia, unspecified: Secondary | ICD-10-CM

## 2020-09-12 DIAGNOSIS — I700 Atherosclerosis of aorta: Secondary | ICD-10-CM | POA: Diagnosis not present

## 2020-09-12 DIAGNOSIS — Z136 Encounter for screening for cardiovascular disorders: Secondary | ICD-10-CM | POA: Diagnosis not present

## 2020-09-12 DIAGNOSIS — R7303 Prediabetes: Secondary | ICD-10-CM | POA: Diagnosis not present

## 2020-09-12 DIAGNOSIS — F339 Major depressive disorder, recurrent, unspecified: Secondary | ICD-10-CM | POA: Diagnosis not present

## 2020-09-12 DIAGNOSIS — Z9189 Other specified personal risk factors, not elsewhere classified: Secondary | ICD-10-CM | POA: Diagnosis not present

## 2020-09-13 ENCOUNTER — Other Ambulatory Visit: Payer: Self-pay | Admitting: Adult Health

## 2020-09-13 DIAGNOSIS — E538 Deficiency of other specified B group vitamins: Secondary | ICD-10-CM

## 2020-09-13 MED ORDER — ROSUVASTATIN CALCIUM 5 MG PO TABS: ORAL_TABLET | ORAL | 3 refills | Status: DC

## 2020-09-17 LAB — MICROSCOPIC MESSAGE

## 2020-09-17 LAB — CBC WITH DIFFERENTIAL/PLATELET
Absolute Monocytes: 576 cells/uL (ref 200–950)
Basophils Absolute: 43 cells/uL (ref 0–200)
Basophils Relative: 0.6 %
Eosinophils Absolute: 58 cells/uL (ref 15–500)
Eosinophils Relative: 0.8 %
HCT: 46.3 % (ref 38.5–50.0)
Hemoglobin: 15.8 g/dL (ref 13.2–17.1)
Lymphs Abs: 2074 cells/uL (ref 850–3900)
MCH: 35.2 pg — ABNORMAL HIGH (ref 27.0–33.0)
MCHC: 34.1 g/dL (ref 32.0–36.0)
MCV: 103.1 fL — ABNORMAL HIGH (ref 80.0–100.0)
MPV: 10.3 fL (ref 7.5–12.5)
Monocytes Relative: 8 %
Neutro Abs: 4450 cells/uL (ref 1500–7800)
Neutrophils Relative %: 61.8 %
Platelets: 236 10*3/uL (ref 140–400)
RBC: 4.49 10*6/uL (ref 4.20–5.80)
RDW: 12.1 % (ref 11.0–15.0)
Total Lymphocyte: 28.8 %
WBC: 7.2 10*3/uL (ref 3.8–10.8)

## 2020-09-17 LAB — URINALYSIS, ROUTINE W REFLEX MICROSCOPIC
Bacteria, UA: NONE SEEN /HPF
Bilirubin Urine: NEGATIVE
Glucose, UA: NEGATIVE
Hgb urine dipstick: NEGATIVE
Hyaline Cast: NONE SEEN /LPF
Nitrite: NEGATIVE
Protein, ur: NEGATIVE
Specific Gravity, Urine: 1.022 (ref 1.001–1.03)
Squamous Epithelial / HPF: NONE SEEN /HPF (ref <=5)
WBC, UA: NONE SEEN /HPF (ref 0–5)
pH: <=5 (ref 5.0–8.0)

## 2020-09-17 LAB — COMPLETE METABOLIC PANEL WITH GFR
AG Ratio: 2.1 (calc) (ref 1.0–2.5)
ALT: 29 U/L (ref 9–46)
AST: 24 U/L (ref 10–35)
Albumin: 4.4 g/dL (ref 3.6–5.1)
Alkaline phosphatase (APISO): 71 U/L (ref 35–144)
BUN: 15 mg/dL (ref 7–25)
CO2: 27 mmol/L (ref 20–32)
Calcium: 9.4 mg/dL (ref 8.6–10.3)
Chloride: 100 mmol/L (ref 98–110)
Creat: 0.96 mg/dL (ref 0.70–1.18)
GFR, Est African American: 92 mL/min/{1.73_m2} (ref >=60)
GFR, Est Non African American: 80 mL/min/{1.73_m2} (ref >=60)
Globulin: 2.1 g/dL (calc) (ref 1.9–3.7)
Glucose, Bld: 106 mg/dL — ABNORMAL HIGH (ref 65–99)
Potassium: 4.2 mmol/L (ref 3.5–5.3)
Sodium: 137 mmol/L (ref 135–146)
Total Bilirubin: 0.9 mg/dL (ref 0.2–1.2)
Total Protein: 6.5 g/dL (ref 6.1–8.1)

## 2020-09-17 LAB — TSH: TSH: 1.48 mIU/L (ref 0.40–4.50)

## 2020-09-17 LAB — PROTIME-INR
INR: 1
Prothrombin Time: 10.3 s (ref 9.0–11.5)

## 2020-09-17 LAB — FOLATE RBC: RBC Folate: 400 ng/mL RBC (ref >280)

## 2020-09-17 LAB — LIPID PANEL
Cholesterol: 169 mg/dL (ref <200)
HDL: 46 mg/dL (ref >=40)
LDL Cholesterol (Calc): 84 mg/dL (calc)
Non-HDL Cholesterol (Calc): 123 mg/dL (calc) (ref <130)
Total CHOL/HDL Ratio: 3.7 (calc) (ref <5.0)
Triglycerides: 320 mg/dL — ABNORMAL HIGH (ref <150)

## 2020-09-17 LAB — MAGNESIUM: Magnesium: 2.1 mg/dL (ref 1.5–2.5)

## 2020-09-17 LAB — VITAMIN B12: Vitamin B-12: 286 pg/mL (ref 200–1100)

## 2020-09-27 DIAGNOSIS — M25561 Pain in right knee: Secondary | ICD-10-CM | POA: Diagnosis not present

## 2020-09-27 DIAGNOSIS — M25562 Pain in left knee: Secondary | ICD-10-CM | POA: Diagnosis not present

## 2020-11-11 NOTE — Progress Notes (Signed)
DUE TO COVID-19 ONLY ONE VISITOR IS ALLOWED TO COME WITH YOU AND STAY IN THE WAITING ROOM ONLY DURING PRE OP AND PROCEDURE DAY OF SURGERY. THE 1 VISITOR  MAY VISIT WITH YOU AFTER SURGERY IN YOUR PRIVATE ROOM DURING VISITING HOURS ONLY!  YOU NEED TO HAVE A COVID 19 TEST ON__6/07/2020 _____ @_______ , THIS TEST MUST BE DONE BEFORE SURGERY,  COVID TESTING SITE [address] [place] [address], IT IS ON THE RIGHT GOING OUT [address] APPROXIMATELY  2 MINUTES PAST [employer] ON THE RIGHT. ONCE YOUR COVID TEST IS COMPLETED,  PLEASE BEGIN THE QUARANTINE INSTRUCTIONS AS OUTLINED IN YOUR HANDOUT.                Eric [name]  11/11/2020   Your procedure is scheduled on:  11/25/2020   Report to [facility] Main  Entrance   Report to admitting at  0700   AM     Call this number if you have problems the morning of surgery [phone]    REMEMBER: NO  SOLID FOOD CANDY OR GUM AFTER MIDNIGHT. CLEAR LIQUIDS UNTIL    0615AM        . NOTHING BY MOUTH EXCEPT CLEAR LIQUIDS UNTIL   0615AM    . PLEASE FINISH ENSURE DRINK PER SURGEON ORDER  WHICH NEEDS TO BE COMPLETED AT      .  [id number]     CLEAR LIQUID DIET   Foods Allowed                                                                    Coffee and tea, regular and decaf                            Fruit ices (not with fruit pulp)                                      Iced Popsicles                                    Carbonated beverages, regular and diet                                    Cranberry, grape and apple juices Sports drinks like Gatorade Lightly seasoned clear broth or consume(fat free) Sugar, honey syrup ___________________________________________________________________      BRUSH YOUR TEETH MORNING OF SURGERY AND RINSE YOUR MOUTH OUT, NO CHEWING GUM CANDY OR MINTS.     Take these medicines the morning of surgery with A SIP OF WATER:       NONE  DO NOT TAKE ANY DIABETIC MEDICATIONS DAY OF YOUR  SURGERY                               You may not have any metal on your body including hair pins and  piercings  Do not wear jewelry, make-up, lotions, powders or perfumes, deodorant             Do not wear nail polish on your fingernails.  Do not shave  48 hours prior to surgery.              Men may shave face and neck.   Do not bring valuables to the hospital. [place].  Contacts, dentures or bridgework may not be worn into surgery.  Leave suitcase in the car. After surgery it may be brought to your room.     Patients discharged the day of surgery will not be allowed to drive home. IF YOU ARE HAVING SURGERY AND GOING HOME THE SAME DAY, YOU MUST HAVE AN ADULT TO DRIVE YOU HOME AND BE WITH YOU FOR 24 HOURS. YOU MAY GO HOME BY TAXI OR UBER OR ORTHERWISE, BUT AN ADULT MUST ACCOMPANY YOU HOME AND STAY WITH YOU FOR 24 HOURS.  Name and phone number of your driver:  Special Instructions: N/A              Please read over the following fact sheets you were given: _____________________________________________________________________  [facility] - Preparing for Surgery Before surgery, you can play an important role.  Because skin is not sterile, your skin needs to be as free of germs as possible.  You can reduce the number of germs on your skin by washing with CHG (chlorahexidine gluconate) soap before surgery.  CHG is an antiseptic cleaner which kills germs and bonds with the skin to continue killing germs even after washing. Please DO NOT use if you have an allergy to CHG or antibacterial soaps.  If your skin becomes reddened/irritated stop using the CHG and inform your nurse when you arrive at Short Stay. Do not shave (including legs and underarms) for at least 48 hours prior to the first CHG shower.  You may shave your face/neck. Please follow these instructions carefully:  1.  Shower with CHG Soap the night before surgery and the   morning of Surgery.  2.  If you choose to wash your hair, wash your hair first as usual with your  normal  shampoo.  3.  After you shampoo, rinse your hair and body thoroughly to remove the  shampoo.                           4.  Use CHG as you would any other liquid soap.  You can apply chg directly  to the skin and wash                       Gently with a scrungie or clean washcloth.  5.  Apply the CHG Soap to your body ONLY FROM THE NECK DOWN.   Do not use on face/ open                           Wound or open sores. Avoid contact with eyes, ears mouth and genitals (private parts).                       Wash face,  Genitals (private parts) with your normal soap.             6.  Wash thoroughly, paying special attention to the area where your surgery  will be performed.  7.  Thoroughly rinse your body with warm water from the neck down.  8.  DO NOT shower/wash with your normal soap after using and rinsing off  the CHG Soap.                9.  Pat yourself dry with a clean towel.            10.  Wear clean pajamas.            11.  Place clean sheets on your bed the night of your first shower and do not  sleep with pets. Day of Surgery : Do not apply any lotions/deodorants the morning of surgery.  Please wear clean clothes to the hospital/surgery center.  FAILURE TO FOLLOW THESE INSTRUCTIONS MAY RESULT IN THE CANCELLATION OF YOUR SURGERY PATIENT SIGNATURE_________________________________  NURSE SIGNATURE__________________________________  ________________________________________________________________________

## 2020-11-14 ENCOUNTER — Other Ambulatory Visit: Payer: Self-pay

## 2020-11-14 ENCOUNTER — Encounter (HOSPITAL_COMMUNITY): Payer: Self-pay

## 2020-11-14 ENCOUNTER — Encounter (HOSPITAL_COMMUNITY)
Admission: RE | Admit: 2020-11-14 | Discharge: 2020-11-14 | Disposition: A | Discharge status: Home / Self Care | Payer: PPO | Source: Ambulatory Visit | Attending: Orthopedic Surgery | Admitting: Orthopedic Surgery

## 2020-11-14 DIAGNOSIS — Z01812 Encounter for preprocedural laboratory examination: Secondary | ICD-10-CM | POA: Diagnosis not present

## 2020-11-14 HISTORY — DX: Unspecified osteoarthritis, unspecified site: M19.90

## 2020-11-14 HISTORY — DX: Atherosclerosis of aorta: I70.0

## 2020-11-14 HISTORY — DX: Gastro-esophageal reflux disease without esophagitis: K21.9

## 2020-11-14 LAB — COMPREHENSIVE METABOLIC PANEL
ALT: 43 U/L (ref 0–44)
AST: 31 U/L (ref 15–41)
Albumin: 4 g/dL (ref 3.5–5.0)
Alkaline Phosphatase: 84 U/L (ref 38–126)
Anion gap: 9 (ref 5–15)
BUN: 13 mg/dL (ref 8–23)
CO2: 24 mmol/L (ref 22–32)
Calcium: 9.3 mg/dL (ref 8.9–10.3)
Chloride: 102 mmol/L (ref 98–111)
Creatinine, Ser: 1.08 mg/dL (ref 0.61–1.24)
GFR, Estimated: >60 mL/min (ref >60)
Glucose, Bld: 141 mg/dL — ABNORMAL HIGH (ref 70–99)
Potassium: 4.1 mmol/L (ref 3.5–5.1)
Sodium: 135 mmol/L (ref 135–145)
Total Bilirubin: 0.6 mg/dL (ref 0.3–1.2)
Total Protein: 7.1 g/dL (ref 6.5–8.1)

## 2020-11-14 LAB — TYPE AND SCREEN
ABO/RH(D): O POS
Antibody Screen: NEGATIVE

## 2020-11-14 LAB — PROTIME-INR
INR: 1 (ref 0.8–1.2)
Prothrombin Time: 13.4 seconds (ref 11.4–15.2)

## 2020-11-14 LAB — CBC
HCT: 48.7 % (ref 39.0–52.0)
Hemoglobin: 16.5 g/dL (ref 13.0–17.0)
MCH: 35.5 pg — ABNORMAL HIGH (ref 26.0–34.0)
MCHC: 33.9 g/dL (ref 30.0–36.0)
MCV: 104.7 fL — ABNORMAL HIGH (ref 80.0–100.0)
Platelets: 244 10*3/uL (ref 150–400)
RBC: 4.65 MIL/uL (ref 4.22–5.81)
RDW: 12 % (ref 11.5–15.5)
WBC: 7.4 10*3/uL (ref 4.0–10.5)
nRBC: 0 % (ref 0.0–0.2)

## 2020-11-14 LAB — SURGICAL PCR SCREEN
MRSA, PCR: NEGATIVE
Staphylococcus aureus: NEGATIVE

## 2020-11-14 LAB — APTT: aPTT: 28 seconds (ref 24–36)

## 2020-11-14 NOTE — Progress Notes (Addendum)
Anesthesia Review:  PCP: DR Unk Pinto LOV with Liane Comber, Np on 09/12/20  Surgery is mentioned in note and pt needing clearance  Cardiologist : Chest x-ray : EKG :09/12/20  Echo : Stress test: Cardiac Cath :  Activity level: can do a flight of stairs without difficulty  Sleep Study/ CPAP :none  Fasting Blood Sugar :      / Checks Blood Sugar -- times a day:   Blood Thinner/ Instructions /Last Dose: ASA / Instructions/ Last Dose :

## 2020-11-21 ENCOUNTER — Other Ambulatory Visit (HOSPITAL_COMMUNITY)
Admission: RE | Admit: 2020-11-21 | Discharge: 2020-11-21 | Disposition: A | Discharge status: Home / Self Care | Payer: PPO | Source: Ambulatory Visit | Attending: Orthopedic Surgery | Admitting: Orthopedic Surgery

## 2020-11-21 DIAGNOSIS — Z01812 Encounter for preprocedural laboratory examination: Secondary | ICD-10-CM | POA: Diagnosis not present

## 2020-11-21 DIAGNOSIS — Z20822 Contact with and (suspected) exposure to covid-19: Secondary | ICD-10-CM | POA: Insufficient documentation

## 2020-11-21 LAB — SARS CORONAVIRUS 2 (TAT 6-24 HRS): SARS Coronavirus 2: NEGATIVE

## 2020-11-21 NOTE — H&P (Signed)
TOTAL KNEE ADMISSION H&P  Patient is being admitted for left total knee arthroplasty.  Subjective:  Chief Complaint: Left knee pain.  HPI: Eric [name], 70 y.o. male has a history of pain and functional disability in the left knee due to arthritis and has failed non-surgical conservative treatments for greater than 12 weeks to include NSAID's and/or analgesics and activity modification. Onset of symptoms was gradual, starting several years ago with gradually worsening course since that time. The patient noted no past surgery on the left knee.  Patient currently rates pain in the left knee at 5 out of 10 with activity. Patient has worsening of pain with activity and weight bearing and pain that interferes with activities of daily living. Patient has evidence of periarticular osteophytes and joint space narrowing by imaging studies. There is no active infection.  Patient Active Problem List   Diagnosis Date Noted  . B12 deficiency 09/13/2020  . History of basal cell cancer 06/13/2019  . Chronic pain of both knees 06/13/2019  . BPH with obstruction/lower urinary tract symptoms 01/04/2019  . Atherosclerosis of aorta ([place]) 06/28/2018  . Emphysema of lung ([place]) 06/28/2018  . History of smoking greater than 50 pack years (quit 2006) 06/08/2018  . Obesity (BMI 30.0-34.9) 06/07/2018  . Medication management 10/11/2014  . Major depression, recurrent, chronic ([place]) 04/06/2014  . Labile hypertension   . Abnormal glucose   . Hyperlipidemia, mixed   . Vitamin D deficiency     Past Medical History:  Diagnosis Date  . Arteriosclerosis of aorta ([place])   . Arthritis   . Basal cell carcinoma   . GERD (gastroesophageal reflux disease)   . Hyperlipidemia   . Labile hypertension    pt denies   . Other testicular hypofunction   . Prediabetes    pt denies at preop of 11/14/20   . Vitamin D deficiency     Past Surgical History:  Procedure Laterality Date  . CATARACT EXTRACTION, BILATERAL  Bilateral 2019   Dr. Carmell Austria  . dental implant surgery     . KNEE ARTHROSCOPY Left 1988    Prior to Admission medications   Medication Sig Start Date End Date Taking? Authorizing Provider  acetaminophen (TYLENOL) 500 MG tablet Take 1,000 mg by mouth every 6 (six) hours as needed for moderate pain.   Yes [provider]  calcium carbonate (TUMS - DOSED IN MG ELEMENTAL CALCIUM) 500 MG chewable tablet Chew 1,000 mg by mouth daily as needed for indigestion or heartburn.   Yes [provider]  Cholecalciferol (VITAMIN D) 50 MCG (2000 UT) tablet Take 10,000 Units by mouth daily.   Yes [provider]  citalopram (CELEXA) 40 MG tablet Take 1 tablet Daily for Mood Patient taking differently: Take by mouth daily. 01/22/20  Yes Unk Pinto, MD  Cyanocobalamin (B-12 PO) Take 1 tablet by mouth daily.   Yes [provider]  Zinc 50 MG CAPS Take 50 mg by mouth daily.   Yes [provider]  pseudoephedrine (SUDAFED) 120 MG 12 hr tablet Take      1 tablet      2 x /day (every 12 hours)          for Head and Chest Congestion Patient not taking: Reported on 11/06/2020 06/20/20   Unk Pinto, MD  rosuvastatin (CRESTOR) 5 MG tablet Take 1 tab three days a week (MWF) for cholesterol and plaque on arteries. Patient not taking: Reported on 11/06/2020 09/13/20   Liane Comber, NP  Allergies  Allergen Reactions  . Lipitor [Atorvastatin]     Cloudy headed   . Lopid [Gemfibrozil]     Unknown reaction   . Penicillins     Unknown childhood reaction    Social History   Socioeconomic History  . Marital status: Married    Spouse name: Not on file  . Number of children: Not on file  . Years of education: Not on file  . Highest education level: Not on file  Occupational History  . Not on file  Tobacco Use  . Smoking status: Former Smoker    Packs/day: 1.50    Years: 37.00    Pack years: 55.50    Types: Cigarettes    Start date: 06/08/1968     Quit date: 10/10/2004    Years since quitting: 16.1  . Smokeless tobacco: Never Used  Vaping Use  . Vaping Use: Never used  Substance and Sexual Activity  . Alcohol use: Yes    Alcohol/week: 0.0 standard drinks    Comment: 5 drinks per week   . Drug use: No  . Sexual activity: Not on file  Other Topics Concern  . Not on file  Social History Narrative  . Not on file   Social Determinants of Health   Financial Resource Strain: Not on file  Food Insecurity: Not on file  Transportation Needs: Not on file  Physical Activity: Not on file  Stress: Not on file  Social Connections: Not on file  Intimate Partner Violence: Not on file    Tobacco Use: Medium Risk  . Smoking Tobacco Use: Former Smoker  . Smokeless Tobacco Use: Never Used   Social History   Substance and Sexual Activity  Alcohol Use Yes  . Alcohol/week: 0.0 standard drinks   Comment: 5 drinks per week     Family History  Problem Relation Age of Onset  . Diabetes Mother   . Alzheimer's disease Mother   . Heart disease Father   . Hypertension Father   . Stroke Father   . Colon polyps Father     ROS: Constitutional: no fever, no chills, no night sweats, no significant weight loss Cardiovascular: no chest pain, no palpitations Respiratory: no cough, no shortness of breath, No COPD Gastrointestinal: no vomiting, no nausea Musculoskeletal: no swelling in Joints, Joint Pain Neurologic: no numbness, no tingling, no difficulty with balance   Objective:  Physical Exam: Well nourished and well developed.  General: Alert and oriented x3, cooperative and pleasant, no acute distress.  Head: normocephalic, atraumatic, neck supple.  Eyes: EOMI.  Respiratory: breath sounds clear in all fields, no wheezing, rales, or rhonchi. Cardiovascular: Regular rate and rhythm, no murmurs, gallops or rubs.  Abdomen: non-tender to palpation and soft, normoactive bowel sounds. Musculoskeletal:  Significantly antalgic gait  pattern favoring the left side without the use of assisted devices.    Bilateral Hip Exam: ROM: is normal without discomfort.    Right Knee Exam: No effusion. Range of motion is 0-130 degrees. No crepitus on range of motion of the knee. Positive medial joint line tenderness. No lateral joint line tenderness. Stable knee.    Left Knee Exam: No effusion. Range of motion is 5-125 degrees. Moderate crepitus on range of motion of the knee. Positive medial joint line pain. Slight lateral joint line tenderness. Stable knee. Sensation and motor function intact in LE. Distal pulses 2+. Calves soft and nontender.   Vital signs in last 24 hours:    Imaging Review AP and lateral of  the bilateral knees demonstrate bone-on-bone arthritis in the medial and patellofemoral compartments of the left knee and near bone-on-bone arthritis in the medial compartment of the right knee.  Assessment/Plan:  End stage arthritis, left knee   The patient history, physical examination, clinical judgment of the provider and imaging studies are consistent with end stage degenerative joint disease of the left knee and total knee arthroplasty is deemed medically necessary. The treatment options including medical management, injection therapy arthroscopy and arthroplasty were discussed at length. The risks and benefits of total knee arthroplasty were presented and reviewed. The risks due to aseptic loosening, infection, stiffness, patella tracking problems, thromboembolic complications and other imponderables were discussed. The patient acknowledged the explanation, agreed to proceed with the plan and consent was signed. Patient is being admitted for inpatient treatment for surgery, pain control, PT, OT, prophylactic antibiotics, VTE prophylaxis, progressive ambulation and ADLs and discharge planning. The patient is planning to be discharged home.   Patient's anticipated LOS is less than 2 midnights, meeting these requirements: -  Lives within 1 hour of care - Has a competent adult at home to recover with post-op recover - NO history of  - Chronic pain requiring opioids  - Diabetes  - Coronary Artery Disease  - Heart failure  - Heart attack  - Stroke  - DVT/VTE  - Cardiac arrhythmia  - Respiratory Failure/COPD  - Renal failure  - Anemia  - Advanced Liver disease    Therapy Plans: [facility] Disposition: Home with wife Planned DVT Prophylaxis: Xarelto 10mg  (Hx of skin cancer) DME Needed: None PCP: Dolly Rias, MD (clearance received) TXA: IV Allergies: PCN (unknown reaction) Anesthesia Concerns: None BMI: 31.1 Last HgbA1c: N/A  Pharmacy: [facility]  on [address]    - Patient was instructed on what medications to stop prior to surgery. - Follow-up visit in 2 weeks with Dr. Wynelle Link - Begin physical therapy following surgery - Pre-operative lab work as pre-surgical testing - Prescriptions will be provided in hospital at time of discharge  Fenton Foy, [facility], PA-C Orthopedic Surgery [facility] [place]

## 2020-11-24 MED ORDER — BUPIVACAINE LIPOSOME 1.3 % IJ SUSP
20.0000 mL | Freq: Once | INTRAMUSCULAR | Status: DC
  Filled 2020-11-24: qty 20

## 2020-11-24 NOTE — Anesthesia Preprocedure Evaluation (Addendum)
Anesthesia Evaluation  Patient identified by MRN, date of birth, ID band Patient awake    Reviewed: Allergy & Precautions, NPO status , Patient's Chart, lab work & pertinent test results  History of Anesthesia Complications Negative for: history of anesthetic complications  Airway Mallampati: II  TM Distance: >3 FB Neck ROM: Full    Dental  (+) Dental Advisory Given, Edentulous Upper, Edentulous Lower   Pulmonary COPD, former smoker,    Pulmonary exam normal        Cardiovascular hypertension, Normal cardiovascular exam     Neuro/Psych PSYCHIATRIC DISORDERS Depression negative neurological ROS     GI/Hepatic Neg liver ROS, GERD  ,  Endo/Other  negative endocrine ROS  Renal/GU negative Renal ROS     Musculoskeletal negative musculoskeletal ROS (+)   Abdominal   Peds  Hematology negative hematology ROS (+)   Anesthesia Other Findings   Reproductive/Obstetrics                            Anesthesia Physical Anesthesia Plan  ASA: II  Anesthesia Plan: MAC and Spinal   Post-op Pain Management:  Regional for Post-op pain   Induction: Intravenous  PONV Risk Score and Plan: 2 and Ondansetron and Propofol infusion  Airway Management Planned: Natural Airway, Simple Face Mask and Nasal Cannula  Additional Equipment:   Intra-op Plan:   Post-operative Plan: Extubation in OR  Informed Consent: I have reviewed the patients History and Physical, chart, labs and discussed the procedure including the risks, benefits and alternatives for the proposed anesthesia with the patient or authorized representative who has indicated his/her understanding and acceptance.     Dental advisory given  Plan Discussed with: Anesthesiologist and CRNA  Anesthesia Plan Comments:        Anesthesia Quick Evaluation

## 2020-11-25 ENCOUNTER — Encounter (HOSPITAL_COMMUNITY)
Admission: RE | Disposition: A | Discharge status: Home / Self Care | Payer: Self-pay | Source: Home / Self Care | Attending: Orthopedic Surgery

## 2020-11-25 ENCOUNTER — Ambulatory Visit (HOSPITAL_COMMUNITY): Payer: PPO | Admitting: Physician Assistant

## 2020-11-25 ENCOUNTER — Ambulatory Visit (HOSPITAL_COMMUNITY)
Admission: RE | Admit: 2020-11-25 | Discharge: 2020-11-26 | Disposition: A | Discharge status: Home / Self Care | Payer: PPO | Attending: Orthopedic Surgery | Admitting: Orthopedic Surgery

## 2020-11-25 ENCOUNTER — Ambulatory Visit (HOSPITAL_COMMUNITY): Payer: PPO | Admitting: Anesthesiology

## 2020-11-25 ENCOUNTER — Encounter (HOSPITAL_COMMUNITY): Payer: Self-pay | Admitting: Orthopedic Surgery

## 2020-11-25 DIAGNOSIS — Z87891 Personal history of nicotine dependence: Secondary | ICD-10-CM | POA: Insufficient documentation

## 2020-11-25 DIAGNOSIS — M1712 Unilateral primary osteoarthritis, left knee: Secondary | ICD-10-CM | POA: Insufficient documentation

## 2020-11-25 DIAGNOSIS — E782 Mixed hyperlipidemia: Secondary | ICD-10-CM | POA: Diagnosis not present

## 2020-11-25 DIAGNOSIS — M1710 Unilateral primary osteoarthritis, unspecified knee: Secondary | ICD-10-CM | POA: Diagnosis present

## 2020-11-25 DIAGNOSIS — E559 Vitamin D deficiency, unspecified: Secondary | ICD-10-CM | POA: Diagnosis not present

## 2020-11-25 DIAGNOSIS — M179 Osteoarthritis of knee, unspecified: Secondary | ICD-10-CM

## 2020-11-25 DIAGNOSIS — G8918 Other acute postprocedural pain: Secondary | ICD-10-CM | POA: Diagnosis not present

## 2020-11-25 HISTORY — PX: TOTAL KNEE ARTHROPLASTY: SHX125

## 2020-11-25 HISTORY — DX: Osteoarthritis of knee, unspecified: M17.9

## 2020-11-25 LAB — ABO/RH: ABO/RH(D): O POS

## 2020-11-25 SURGERY — ARTHROPLASTY, KNEE, TOTAL
Anesthesia: Monitor Anesthesia Care | Site: Knee | Laterality: Left

## 2020-11-25 MED ORDER — SODIUM CHLORIDE (PF) 0.9 % IJ SOLN
INTRAMUSCULAR | Status: AC
  Filled 2020-11-25: qty 10

## 2020-11-25 MED ORDER — POVIDONE-IODINE 10 % EX SWAB
2.0000 "application " | Freq: Once | CUTANEOUS | Status: AC
  Administered 2020-11-25: 2 via TOPICAL

## 2020-11-25 MED ORDER — CEFAZOLIN SODIUM-DEXTROSE 2-4 GM/100ML-% IV SOLN
2.0000 g | Freq: Four times a day (QID) | INTRAVENOUS | Status: AC
  Administered 2020-11-25 (×2): 2 g via INTRAVENOUS
  Filled 2020-11-25 (×2): qty 100

## 2020-11-25 MED ORDER — PROPOFOL 10 MG/ML IV BOLUS
INTRAVENOUS | Status: AC
  Filled 2020-11-25: qty 20

## 2020-11-25 MED ORDER — HYDROMORPHONE HCL 1 MG/ML IJ SOLN: 0.2500 mg | INTRAMUSCULAR | Status: DC | PRN

## 2020-11-25 MED ORDER — SODIUM CHLORIDE 0.9 % IR SOLN
Status: DC | PRN
  Administered 2020-11-25: 1000 mL

## 2020-11-25 MED ORDER — FLEET ENEMA 7-19 GM/118ML RE ENEM: 1.0000 | ENEMA | Freq: Once | RECTAL | Status: DC | PRN

## 2020-11-25 MED ORDER — ORAL CARE MOUTH RINSE: 15.0000 mL | Freq: Once | OROMUCOSAL | Status: AC

## 2020-11-25 MED ORDER — DEXAMETHASONE SODIUM PHOSPHATE 10 MG/ML IJ SOLN
10.0000 mg | Freq: Once | INTRAMUSCULAR | Status: AC
  Administered 2020-11-26: 10 mg via INTRAVENOUS
  Filled 2020-11-25: qty 1

## 2020-11-25 MED ORDER — CEFAZOLIN SODIUM-DEXTROSE 2-4 GM/100ML-% IV SOLN
2.0000 g | INTRAVENOUS | Status: AC
  Administered 2020-11-25: 2 g via INTRAVENOUS
  Filled 2020-11-25: qty 100

## 2020-11-25 MED ORDER — BUPIVACAINE LIPOSOME 1.3 % IJ SUSP
INTRAMUSCULAR | Status: DC | PRN
  Administered 2020-11-25: 20 mL

## 2020-11-25 MED ORDER — MIDAZOLAM HCL 2 MG/2ML IJ SOLN
1.0000 mg | INTRAMUSCULAR | Status: DC
  Administered 2020-11-25: 2 mg via INTRAVENOUS
  Filled 2020-11-25: qty 2

## 2020-11-25 MED ORDER — CHLORHEXIDINE GLUCONATE 0.12 % MT SOLN
15.0000 mL | Freq: Once | OROMUCOSAL | Status: AC
  Administered 2020-11-25: 15 mL via OROMUCOSAL

## 2020-11-25 MED ORDER — ACETAMINOPHEN 10 MG/ML IV SOLN
1000.0000 mg | Freq: Four times a day (QID) | INTRAVENOUS | Status: DC
  Administered 2020-11-25: 1000 mg via INTRAVENOUS
  Filled 2020-11-25: qty 100

## 2020-11-25 MED ORDER — ACETAMINOPHEN 500 MG PO TABS
1000.0000 mg | ORAL_TABLET | Freq: Four times a day (QID) | ORAL | Status: AC
  Administered 2020-11-25 – 2020-11-26 (×4): 1000 mg via ORAL
  Filled 2020-11-25 (×4): qty 2

## 2020-11-25 MED ORDER — ONDANSETRON HCL 4 MG/2ML IJ SOLN: 4.0000 mg | Freq: Four times a day (QID) | INTRAMUSCULAR | Status: DC | PRN

## 2020-11-25 MED ORDER — METOCLOPRAMIDE HCL 5 MG PO TABS: 5.0000 mg | ORAL_TABLET | Freq: Three times a day (TID) | ORAL | Status: DC | PRN

## 2020-11-25 MED ORDER — LACTATED RINGERS IV SOLN: INTRAVENOUS | Status: DC

## 2020-11-25 MED ORDER — DEXAMETHASONE SODIUM PHOSPHATE 10 MG/ML IJ SOLN
INTRAMUSCULAR | Status: DC | PRN
  Administered 2020-11-25: 10 mg

## 2020-11-25 MED ORDER — ONDANSETRON HCL 4 MG/2ML IJ SOLN
INTRAMUSCULAR | Status: AC
  Filled 2020-11-25: qty 2

## 2020-11-25 MED ORDER — ONDANSETRON HCL 4 MG PO TABS: 4.0000 mg | ORAL_TABLET | Freq: Four times a day (QID) | ORAL | Status: DC | PRN

## 2020-11-25 MED ORDER — SODIUM CHLORIDE (PF) 0.9 % IJ SOLN
INTRAMUSCULAR | Status: DC | PRN
  Administered 2020-11-25: 60 mL

## 2020-11-25 MED ORDER — CITALOPRAM HYDROBROMIDE 20 MG PO TABS
40.0000 mg | ORAL_TABLET | Freq: Every day | ORAL | Status: DC
  Administered 2020-11-26: 40 mg via ORAL
  Filled 2020-11-25: qty 2

## 2020-11-25 MED ORDER — MENTHOL 3 MG MT LOZG: 1.0000 | LOZENGE | OROMUCOSAL | Status: DC | PRN

## 2020-11-25 MED ORDER — DOCUSATE SODIUM 100 MG PO CAPS
100.0000 mg | ORAL_CAPSULE | Freq: Two times a day (BID) | ORAL | Status: DC
  Administered 2020-11-25 – 2020-11-26 (×2): 100 mg via ORAL
  Filled 2020-11-25 (×2): qty 1

## 2020-11-25 MED ORDER — OXYCODONE HCL 5 MG PO TABS
5.0000 mg | ORAL_TABLET | ORAL | Status: DC | PRN
  Administered 2020-11-25: 10 mg via ORAL
  Administered 2020-11-25: 5 mg via ORAL
  Administered 2020-11-26: 10 mg via ORAL
  Filled 2020-11-25 (×4): qty 2

## 2020-11-25 MED ORDER — DEXAMETHASONE SODIUM PHOSPHATE 10 MG/ML IJ SOLN: 8.0000 mg | Freq: Once | INTRAMUSCULAR | Status: DC

## 2020-11-25 MED ORDER — ROPIVACAINE HCL 7.5 MG/ML IJ SOLN
INTRAMUSCULAR | Status: DC | PRN
  Administered 2020-11-25: 20 mL via PERINEURAL

## 2020-11-25 MED ORDER — POLYETHYLENE GLYCOL 3350 17 G PO PACK: 17.0000 g | PACK | Freq: Every day | ORAL | Status: DC | PRN

## 2020-11-25 MED ORDER — METOCLOPRAMIDE HCL 5 MG/ML IJ SOLN: 5.0000 mg | Freq: Three times a day (TID) | INTRAMUSCULAR | Status: DC | PRN

## 2020-11-25 MED ORDER — STERILE WATER FOR IRRIGATION IR SOLN
Status: DC | PRN
  Administered 2020-11-25: 2000 mL

## 2020-11-25 MED ORDER — PROPOFOL 500 MG/50ML IV EMUL
INTRAVENOUS | Status: DC | PRN
  Administered 2020-11-25: 75 ug/kg/min via INTRAVENOUS

## 2020-11-25 MED ORDER — ONDANSETRON HCL 4 MG/2ML IJ SOLN
INTRAMUSCULAR | Status: DC | PRN
  Administered 2020-11-25: 4 mg via INTRAVENOUS

## 2020-11-25 MED ORDER — PHENOL 1.4 % MT LIQD: 1.0000 | OROMUCOSAL | Status: DC | PRN

## 2020-11-25 MED ORDER — BISACODYL 10 MG RE SUPP: 10.0000 mg | Freq: Every day | RECTAL | Status: DC | PRN

## 2020-11-25 MED ORDER — DIPHENHYDRAMINE HCL 12.5 MG/5ML PO ELIX: 12.5000 mg | ORAL_SOLUTION | ORAL | Status: DC | PRN

## 2020-11-25 MED ORDER — HYDROMORPHONE HCL 1 MG/ML IJ SOLN: 0.5000 mg | INTRAMUSCULAR | Status: DC | PRN

## 2020-11-25 MED ORDER — SODIUM CHLORIDE 0.9 % IV SOLN: INTRAVENOUS | Status: DC

## 2020-11-25 MED ORDER — RIVAROXABAN 10 MG PO TABS
10.0000 mg | ORAL_TABLET | Freq: Every day | ORAL | Status: DC
  Administered 2020-11-26: 10 mg via ORAL
  Filled 2020-11-25: qty 1

## 2020-11-25 MED ORDER — FENTANYL CITRATE (PF) 100 MCG/2ML IJ SOLN
50.0000 ug | INTRAMUSCULAR | Status: DC
  Administered 2020-11-25: 100 ug via INTRAVENOUS
  Filled 2020-11-25: qty 2

## 2020-11-25 MED ORDER — PROPOFOL 10 MG/ML IV BOLUS
INTRAVENOUS | Status: DC | PRN
  Administered 2020-11-25 (×2): 20 mg via INTRAVENOUS

## 2020-11-25 MED ORDER — PROPOFOL 500 MG/50ML IV EMUL
INTRAVENOUS | Status: AC
  Filled 2020-11-25: qty 50

## 2020-11-25 MED ORDER — MEPIVACAINE HCL (PF) 2 % IJ SOLN
INTRAMUSCULAR | Status: DC | PRN
  Administered 2020-11-25: 60 mg via EPIDURAL

## 2020-11-25 MED ORDER — CALCIUM CARBONATE ANTACID 500 MG PO CHEW
1000.0000 mg | CHEWABLE_TABLET | Freq: Every day | ORAL | Status: DC | PRN
  Administered 2020-11-26: 1000 mg via ORAL
  Filled 2020-11-25: qty 5

## 2020-11-25 MED ORDER — TRANEXAMIC ACID-NACL 1000-0.7 MG/100ML-% IV SOLN
1000.0000 mg | INTRAVENOUS | Status: AC
  Administered 2020-11-25: 1000 mg via INTRAVENOUS
  Filled 2020-11-25: qty 100

## 2020-11-25 MED ORDER — OXYCODONE HCL 5 MG PO TABS: 10.0000 mg | ORAL_TABLET | ORAL | Status: DC | PRN

## 2020-11-25 MED ORDER — MEPIVACAINE HCL (PF) 2 % IJ SOLN
INTRAMUSCULAR | Status: AC
  Filled 2020-11-25: qty 20

## 2020-11-25 MED ORDER — VITAMIN D3 25 MCG (1000 UNIT) PO TABS
10000.0000 [IU] | ORAL_TABLET | Freq: Every day | ORAL | Status: DC
  Administered 2020-11-26: 10000 [IU] via ORAL
  Filled 2020-11-25 (×2): qty 10

## 2020-11-25 SURGICAL SUPPLY — 52 items
ATTUNE MED DOME PAT 41 KNEE (Knees) ×2 IMPLANT
ATTUNE PS FEM LT SZ 7 CEM KNEE (Femur) ×2 IMPLANT
ATTUNE PSRP INSR SZ7 7 KNEE (Insert) ×2 IMPLANT
BAG ZIPLOCK 12X15 (MISCELLANEOUS) ×2 IMPLANT
BASE TIBIAL ROT PLAT SZ 7 KNEE (Knees) ×1 IMPLANT
BLADE SAG 18X100X1.27 (BLADE) ×2 IMPLANT
BLADE SAW SGTL 11.0X1.19X90.0M (BLADE) ×2 IMPLANT
BNDG ELASTIC 6X5.8 VLCR STR LF (GAUZE/BANDAGES/DRESSINGS) ×2 IMPLANT
BOWL SMART MIX CTS (DISPOSABLE) ×2 IMPLANT
CEMENT HV SMART SET (Cement) ×4 IMPLANT
COVER SURGICAL LIGHT HANDLE (MISCELLANEOUS) ×2 IMPLANT
COVER WAND RF STERILE (DRAPES) IMPLANT
CUFF TOURN SGL QUICK 34 (TOURNIQUET CUFF) ×2
CUFF TRNQT CYL 34X4.125X (TOURNIQUET CUFF) ×1 IMPLANT
DECANTER SPIKE VIAL GLASS SM (MISCELLANEOUS) ×2 IMPLANT
DRAPE U-SHAPE 47X51 STRL (DRAPES) ×2 IMPLANT
DRSG AQUACEL AG ADV 3.5X10 (GAUZE/BANDAGES/DRESSINGS) ×2 IMPLANT
DURAPREP 26ML APPLICATOR (WOUND CARE) ×2 IMPLANT
ELECT REM PT RETURN 15FT ADLT (MISCELLANEOUS) ×2 IMPLANT
GLOVE SRG 8 PF TXTR STRL LF DI (GLOVE) ×1 IMPLANT
GLOVE SURG ENC MOIS LTX SZ6.5 (GLOVE) ×2 IMPLANT
GLOVE SURG ENC MOIS LTX SZ8 (GLOVE) ×4 IMPLANT
GLOVE SURG UNDER POLY LF SZ7 (GLOVE) ×2 IMPLANT
GLOVE SURG UNDER POLY LF SZ8 (GLOVE) ×2
GLOVE SURG UNDER POLY LF SZ8.5 (GLOVE) ×2 IMPLANT
GOWN STRL REUS W/TWL LRG LVL3 (GOWN DISPOSABLE) ×4 IMPLANT
GOWN STRL REUS W/TWL XL LVL3 (GOWN DISPOSABLE) ×2 IMPLANT
HANDPIECE INTERPULSE COAX TIP (DISPOSABLE) ×2
HOLDER FOLEY CATH W/STRAP (MISCELLANEOUS) IMPLANT
IMMOBILIZER KNEE 20 (SOFTGOODS) ×2
IMMOBILIZER KNEE 20 THIGH 36 (SOFTGOODS) ×1 IMPLANT
KIT TURNOVER KIT A (KITS) ×2 IMPLANT
MANIFOLD NEPTUNE II (INSTRUMENTS) ×2 IMPLANT
NS IRRIG 1000ML POUR BTL (IV SOLUTION) ×2 IMPLANT
PACK TOTAL KNEE CUSTOM (KITS) ×2 IMPLANT
PADDING CAST COTTON 6X4 STRL (CAST SUPPLIES) ×4 IMPLANT
PENCIL SMOKE EVACUATOR (MISCELLANEOUS) ×2 IMPLANT
PIN DRILL FIX HALF THREAD (BIT) ×2 IMPLANT
PIN STEINMAN FIXATION KNEE (PIN) ×2 IMPLANT
PROTECTOR NERVE ULNAR (MISCELLANEOUS) ×2 IMPLANT
SET HNDPC FAN SPRY TIP SCT (DISPOSABLE) ×1 IMPLANT
STRIP CLOSURE SKIN 1/2X4 (GAUZE/BANDAGES/DRESSINGS) ×2 IMPLANT
SUT MNCRL AB 4-0 PS2 18 (SUTURE) ×2 IMPLANT
SUT STRATAFIX 0 PDS 27 VIOLET (SUTURE) ×2
SUT VIC AB 2-0 CT1 27 (SUTURE) ×6
SUT VIC AB 2-0 CT1 TAPERPNT 27 (SUTURE) ×3 IMPLANT
SUTURE STRATFX 0 PDS 27 VIOLET (SUTURE) ×1 IMPLANT
TIBIAL BASE ROT PLAT SZ 7 KNEE (Knees) ×2 IMPLANT
TRAY FOLEY MTR SLVR 16FR STAT (SET/KITS/TRAYS/PACK) ×2 IMPLANT
TUBE SUCTION HIGH CAP CLEAR NV (SUCTIONS) ×2 IMPLANT
WATER STERILE IRR 1000ML POUR (IV SOLUTION) ×4 IMPLANT
WRAP KNEE MAXI GEL POST OP (GAUZE/BANDAGES/DRESSINGS) ×2 IMPLANT

## 2020-11-25 NOTE — Anesthesia Procedure Notes (Signed)
Anesthesia Regional Block: Adductor canal block   Pre-Anesthetic Checklist: ,, timeout performed, Correct Patient, Correct Site, Correct Laterality, Correct Procedure, Correct Position, site marked, Risks and benefits discussed,  Surgical consent,  Pre-op evaluation,  At surgeon's request and post-op pain management  Laterality: Left  Prep: chloraprep       Needles:  Injection technique: Single-shot  Needle Type: Stimulator Needle - 80     Needle Length: 10cm  Needle Gauge: 21     Additional Needles:   Narrative:  Start time: 11/25/2020 8:28 AM End time: 11/25/2020 8:38 AM Injection made incrementally with aspirations every 5 mL.  Performed by: Personally

## 2020-11-25 NOTE — Interval H&P Note (Signed)
History and Physical Interval Note:  11/25/2020 7:03 AM  Eric [name]  has presented today for surgery, with the diagnosis of left knee osteoarthritis.  The various methods of treatment have been discussed with the patient and family. After consideration of risks, benefits and other options for treatment, the patient has consented to  Procedure(s) with comments: TOTAL KNEE ARTHROPLASTY (Left) - 52min as a surgical intervention.  The patient's history has been reviewed, patient examined, no change in status, stable for surgery.  I have reviewed the patient's chart and labs.  Questions were answered to the patient's satisfaction.     Pilar Plate [name]

## 2020-11-25 NOTE — Care Plan (Signed)
Ortho Bundle Case Management Note  Patient Details  Name: Eric [name] MRN: [id number] Date of Birth: April 10, 1951  L TKA on 11-25-20 DCP:  Home with spouse.  2 story home with 2 ste. DME:  No needs.  Borrowing a RW.  Has elevated toilets, does not want/need a 3-in-1. PT:  [facility].  PT eval scheduled on 11-27-20.                   DME Arranged:  N/A DME Agency:  NA  HH Arranged:  NA HH Agency:  NA  Additional Comments: Please contact me with any questions of if this plan should need to change.  Marianne Sofia, RN,CCM [facility]  [phone] 11/25/2020, 2:38 PM

## 2020-11-25 NOTE — Op Note (Signed)
OPERATIVE REPORT-TOTAL KNEE ARTHROPLASTY   Pre-operative diagnosis- Osteoarthritis  Left knee(s)  Post-operative diagnosis- Osteoarthritis Left knee(s)  Procedure-  Left  Total Knee Arthroplasty  Surgeon- Dione Plover. Ranger Petrich, MD  Assistant- Theresa Duty, PA-C   Anesthesia-  Adductor canal block and spinal  EBL-50 mL   Drains None  Tourniquet time- 38 minutes @ 263 mm Hg  Complications- None  Condition-PACU - hemodynamically stable.   Brief Clinical Note   Eric [name] is a 70 y.o. year old male with end stage OA of his left knee with progressively worsening pain and dysfunction. He has constant pain, with activity and at rest and significant functional deficits with difficulties even with ADLs. He has had extensive non-op management including analgesics, injections of cortisone and viscosupplements, and home exercise program, but remains in significant pain with significant dysfunction. Radiographs show bone on bone arthritis medial and patellofemoral. He presents now for left Total Knee Arthroplasty.    Procedure in detail---   The patient is brought into the operating room and positioned supine on the operating table. After successful administration of  Adductor canal block and spinal,   a tourniquet is placed high on the  Left thigh(s) and the lower extremity is prepped and draped in the usual sterile fashion. Time out is performed by the operating team and then the  Left lower extremity is wrapped in Esmarch, knee flexed and the tourniquet inflated to 300 mmHg.       A midline incision is made with a ten blade through the subcutaneous tissue to the level of the extensor mechanism. A fresh blade is used to make a medial parapatellar arthrotomy. Soft tissue over the proximal medial tibia is subperiosteally elevated to the joint line with a knife and into the semimembranosus bursa with a Cobb elevator. Soft tissue over the proximal lateral tibia is elevated with attention  being paid to avoiding the patellar tendon on the tibial tubercle. The patella is everted, knee flexed 90 degrees and the ACL and PCL are removed. Findings are bone on bone medial and patellofemoral with large global osteophytes.        The drill is used to create a starting hole in the distal femur and the canal is thoroughly irrigated with sterile saline to remove the fatty contents. The 5 degree Left  valgus alignment guide is placed into the femoral canal and the distal femoral cutting block is pinned to remove 9 mm off the distal femur. Resection is made with an oscillating saw.      The tibia is subluxed forward and the menisci are removed. The extramedullary alignment guide is placed referencing proximally at the medial aspect of the tibial tubercle and distally along the second metatarsal axis and tibial crest. The block is pinned to remove 66mm off the more deficient medial  side. Resection is made with an oscillating saw. Size 7is the most appropriate size for the tibia and the proximal tibia is prepared with the modular drill and keel punch for that size.      The femoral sizing guide is placed and size 7 is most appropriate. Rotation is marked off the epicondylar axis and confirmed by creating a rectangular flexion gap at 90 degrees. The size 7 cutting block is pinned in this rotation and the anterior, posterior and chamfer cuts are made with the oscillating saw. The intercondylar block is then placed and that cut is made.      Trial size 7 tibial component, trial size 7  posterior stabilized femur and a 7  mm posterior stabilized rotating platform insert trial is placed. Full extension is achieved with excellent varus/valgus and anterior/posterior balance throughout full range of motion. The patella is everted and thickness measured to be 27  mm. Free hand resection is taken to 15 mm, a 41 template is placed, lug holes are drilled, trial patella is placed, and it tracks normally. Osteophytes are  removed off the posterior femur with the trial in place. All trials are removed and the cut bone surfaces prepared with pulsatile lavage. Cement is mixed and once ready for implantation, the size 7 tibial implant, size  7 posterior stabilized femoral component, and the size 41 patella are cemented in place and the patella is held with the clamp. The trial insert is placed and the knee held in full extension. The Exparel (20 ml mixed with 60 ml saline) is injected into the extensor mechanism, posterior capsule, medial and lateral gutters and subcutaneous tissues.  All extruded cement is removed and once the cement is hard the permanent 7 mm posterior stabilized rotating platform insert is placed into the tibial tray.      The wound is copiously irrigated with saline solution and the extensor mechanism closed with # 0 Stratofix suture. The tourniquet is released for a total tourniquet time of 38  minutes. Flexion against gravity is 140 degrees and the patella tracks normally. Subcutaneous tissue is closed with 2.0 vicryl and subcuticular with running 4.0 Monocryl. The incision is cleaned and dried and steri-strips and a bulky sterile dressing are applied. The limb is placed into a knee immobilizer and the patient is awakened and transported to recovery in stable condition.      Please note that a surgical assistant was a medical necessity for this procedure in order to perform it in a safe and expeditious manner. Surgical assistant was necessary to retract the ligaments and vital neurovascular structures to prevent injury to them and also necessary for proper positioning of the limb to allow for anatomic placement of the prosthesis.   Dione Plover Berthold Glace, MD    11/25/2020, 10:32 AM

## 2020-11-25 NOTE — Progress Notes (Signed)
Orthopedic Tech Progress Note Patient Details:  Eric [name] Sep 05, 1950 [id number]  CPM Left Knee CPM Left Knee: On Left Knee Flexion (Degrees): 40 Left Knee Extension (Degrees): 10  Post Interventions Patient Tolerated: Well  Linus Salmons Arkeem Harts 11/25/2020, 11:47 AM

## 2020-11-25 NOTE — Discharge Instructions (Addendum)
 Frank Aluisio, MD Total Joint Specialist [facility] [address] [place], [place] [address] [phone]  TOTAL KNEE REPLACEMENT POSTOPERATIVE DIRECTIONS    Knee Rehabilitation, Guidelines Following Surgery  Results after knee surgery are often greatly improved when you follow the exercise, range of motion and muscle strengthening exercises prescribed by your doctor. Safety measures are also important to protect the knee from further injury. If any of these exercises cause you to have increased pain or swelling in your knee joint, decrease the amount until you are comfortable again and slowly increase them. If you have problems or questions, call your caregiver or physical therapist for advice.   BLOOD CLOT PREVENTION . Take a 10 mg Xarelto once a day for three weeks following surgery. Then take an 81 mg Aspirin once a day for three weeks. Then discontinue Aspirin. . You may resume your vitamins/supplements once you have discontinued the Xarelto. . Do not take any NSAIDs (Advil, Aleve, Ibuprofen, Meloxicam, etc.) until you have discontinued the Xarelto.   HOME CARE INSTRUCTIONS  . Remove items at home which could result in a fall. This includes throw rugs or furniture in walking pathways.  . ICE to the affected knee as much as tolerated. Icing helps control swelling. If the swelling is well controlled you will be more comfortable and rehab easier. Continue to use ice on the knee for pain and swelling from surgery. You may notice swelling that will progress down to the foot and ankle. This is normal after surgery. Elevate the leg when you are not up walking on it.    . Continue to use the breathing machine which will help keep your temperature down. It is common for your temperature to cycle up and down following surgery, especially at night when you are not up moving around and exerting yourself. The breathing machine keeps your lungs expanded and your  temperature down. . Do not place pillow under the operative knee, focus on keeping the knee straight while resting  DIET You may resume your previous home diet once you are discharged from the hospital.  DRESSING / WOUND CARE / SHOWERING . Keep your bulky bandage on for 2 days. On the third post-operative day you may remove the Ace bandage and gauze. There is a waterproof adhesive bandage on your skin which will stay in place until your first follow-up appointment. Once you remove this you will not need to place another bandage . You may begin showering 3 days following surgery, but do not submerge the incision under water.  ACTIVITY For the first 5 days, the key is rest and control of pain and swelling . Do your home exercises twice a day starting on post-operative day 3. On the days you go to physical therapy, just do the home exercises once that day. . You should rest, ice and elevate the leg for 50 minutes out of every hour. Get up and walk/stretch for 10 minutes per hour. After 5 days you can increase your activity slowly as tolerated. . Walk with your walker as instructed. Use the walker until you are comfortable transitioning to a cane. Walk with the cane in the opposite hand of the operative leg. You may discontinue the cane once you are comfortable and walking steadily. . Avoid periods of inactivity such as sitting longer than an hour when not asleep. This helps prevent blood clots.  . You may discontinue the knee immobilizer once you are able to perform a straight leg   raise while lying down. . You may resume a sexual relationship in one month or when given the OK by your doctor.  . You may return to work once you are cleared by your doctor.  . Do not drive a car for 6 weeks or until released by your surgeon.  . Do not drive while taking narcotics.  TED HOSE STOCKINGS Wear the elastic stockings on both legs for three weeks following surgery during the day. You may remove them at night  for sleeping.  WEIGHT BEARING Weight bearing as tolerated with assist device (walker, cane, etc) as directed, use it as long as suggested by your surgeon or therapist, typically at least 4-6 weeks.  POSTOPERATIVE CONSTIPATION PROTOCOL Constipation - defined medically as fewer than three stools per week and severe constipation as less than one stool per week.  One of the most common issues patients have following surgery is constipation.  Even if you have a regular bowel pattern at home, your normal regimen is likely to be disrupted due to multiple reasons following surgery.  Combination of anesthesia, postoperative narcotics, change in appetite and fluid intake all can affect your bowels.  In order to avoid complications following surgery, here are some recommendations in order to help you during your recovery period.  . Colace (docusate) - Pick up an over-the-counter form of Colace or another stool softener and take twice a day as long as you are requiring postoperative pain medications.  Take with a full glass of water daily.  If you experience loose stools or diarrhea, hold the colace until you stool forms back up. If your symptoms do not get better within 1 week or if they get worse, check with your doctor. . Dulcolax (bisacodyl) - Pick up over-the-counter and take as directed by the product packaging as needed to assist with the movement of your bowels.  Take with a full glass of water.  Use this product as needed if not relieved by Colace only.  . MiraLax (polyethylene glycol) - Pick up over-the-counter to have on hand. MiraLax is a solution that will increase the amount of water in your bowels to assist with bowel movements.  Take as directed and can mix with a glass of water, juice, soda, coffee, or tea. Take if you go more than two days without a movement. Do not use MiraLax more than once per day. Call your doctor if you are still constipated or irregular after using this medication for 7 days  in a row.  If you continue to have problems with postoperative constipation, please contact the office for further assistance and recommendations.  If you experience "the worst abdominal pain ever" or develop nausea or vomiting, please contact the office immediatly for further recommendations for treatment.  ITCHING If you experience itching with your medications, try taking only a single pain pill, or even half a pain pill at a time.  You can also use Benadryl over the counter for itching or also to help with sleep.   MEDICATIONS See your medication summary on the "After Visit Summary" that the nursing staff will review with you prior to discharge.  You may have some home medications which will be placed on hold until you complete the course of blood thinner medication.  It is important for you to complete the blood thinner medication as prescribed by your surgeon.  Continue your approved medications as instructed at time of discharge.  PRECAUTIONS . If you experience chest pain or shortness of breath -   call 911 immediately for transfer to the hospital emergency department.  . If you develop a fever greater that 101 F, purulent drainage from wound, increased redness or drainage from wound, foul odor from the wound/dressing, or calf pain - CONTACT YOUR SURGEON.                                                   FOLLOW-UP APPOINTMENTS Make sure you keep all of your appointments after your operation with your surgeon and caregivers. You should call the office at the above phone number and make an appointment for approximately two weeks after the date of your surgery or on the date instructed by your surgeon outlined in the "After Visit Summary".  RANGE OF MOTION AND STRENGTHENING EXERCISES  Rehabilitation of the knee is important following a knee injury or an operation. After just a few days of immobilization, the muscles of the thigh which control the knee become weakened and shrink (atrophy). Knee  exercises are designed to build up the tone and strength of the thigh muscles and to improve knee motion. Often times heat used for twenty to thirty minutes before working out will loosen up your tissues and help with improving the range of motion but do not use heat for the first two weeks following surgery. These exercises can be done on a training (exercise) mat, on the floor, on a table or on a bed. Use what ever works the best and is most comfortable for you Knee exercises include:  . Leg Lifts - While your knee is still immobilized in a splint or cast, you can do straight leg raises. Lift the leg to 60 degrees, hold for 3 sec, and slowly lower the leg. Repeat 10-20 times 2-3 times daily. Perform this exercise against resistance later as your knee gets better.  Javier Docker and Hamstring Sets - Tighten up the muscle on the front of the thigh (Quad) and hold for 5-10 sec. Repeat this 10-20 times hourly. Hamstring sets are done by pushing the foot backward against an object and holding for 5-10 sec. Repeat as with quad sets.   Leg Slides: Lying on your back, slowly slide your foot toward your buttocks, bending your knee up off the floor (only go as far as is comfortable). Then slowly slide your foot back down until your leg is flat on the floor again.  Angel Wings: Lying on your back spread your legs to the side as far apart as you can without causing discomfort.  A rehabilitation program following serious knee injuries can speed recovery and prevent re-injury in the future due to weakened muscles. Contact your doctor or a physical therapist for more information on knee rehabilitation.   POST-OPERATIVE OPIOID TAPER INSTRUCTIONS: . It is important to wean off of your opioid medication as soon as possible. If you do not need pain medication after your surgery it is ok to stop day one. Marland Kitchen Opioids include: o Codeine, Hydrocodone(Norco, Vicodin), Oxycodone(Percocet, oxycontin) and hydromorphone amongst others.   . Long term and even short term use of opiods can cause: o Increased pain response o Dependence o Constipation o Depression o Respiratory depression o And more.  . Withdrawal symptoms can include o Flu like symptoms o Nausea, vomiting o And more . Techniques to manage these symptoms o Hydrate well o Eat regular healthy meals  o Stay active o Use relaxation techniques(deep breathing, meditating, yoga) . Do Not substitute Alcohol to help with tapering . If you have been on opioids for less than two weeks and do not have pain than it is ok to stop all together.  . Plan to wean off of opioids o This plan should start within one week post op of your joint replacement. o Maintain the same interval or time between taking each dose and first decrease the dose.  o Cut the total daily intake of opioids by one tablet each day o Next start to increase the time between doses. o The last dose that should be eliminated is the evening dose.     IF YOU ARE TRANSFERRED TO A SKILLED REHAB FACILITY If the patient is transferred to a skilled rehab facility following release from the hospital, a list of the current medications will be sent to the facility for the patient to continue.  When discharged from the skilled rehab facility, please have the facility set up the patient's [place] prior to being released. Also, the skilled facility will be responsible for providing the patient with their medications at time of release from the facility to include their pain medication, the muscle relaxants, and their blood thinner medication. If the patient is still at the rehab facility at time of the two week follow up appointment, the skilled rehab facility will also need to assist the patient in arranging follow up appointment in our office and any transportation needs.  MAKE SURE YOU:  . Understand these instructions.  . Get help right away if you are not doing well or get worse.   DENTAL  ANTIBIOTICS:  In most cases prophylactic antibiotics for Dental procdeures after total joint surgery are not necessary.  Exceptions are as follows:  1. History of prior total joint infection  2. Severely immunocompromised (Organ Transplant, cancer chemotherapy, Rheumatoid biologic meds such as O[place]ano)  3. Poorly controlled diabetes (A1C &gt; 8.0, blood glucose over 200)  If you have one of these conditions, contact your surgeon for an antibiotic prescription, prior to your dental procedure.    Pick up stool softner and laxative for home use following surgery while on pain medications. Do not submerge incision under water. Please use good hand washing techniques while changing dressing each day. May shower starting three days after surgery. Please use a clean towel to pat the incision dry following showers. Continue to use ice for pain and swelling after surgery. Do not use any lotions or creams on the incision until instructed by your surgeon.  Information on my medicine - XARELTO (Rivaroxaban)  This medication education was reviewed with me or my healthcare representative as part of my discharge preparation.  The pharmacist that spoke with me during my hospital stay was:    Why was Xarelto prescribed for you? Xarelto was prescribed for you to reduce the risk of blood clots forming after orthopedic surgery. The medical term for these abnormal blood clots is venous thromboembolism (VTE).  What do you need to know about xarelto ? Take your Xarelto ONCE DAILY at the same time every day. You may take it either with or without food.  If you have difficulty swallowing the tablet whole, you may crush it and mix in applesauce just prior to taking your dose.  Take Xarelto exactly as prescribed by your doctor and DO NOT stop taking Xarelto without talking to the doctor who prescribed the medication.  Stopping without other  VTE prevention medication to take the place of Xarelto may  increase your risk of developing a clot.  After discharge, you should have regular check-up appointments with your healthcare provider that is prescribing your Xarelto.    What do you do if you miss a dose? If you miss a dose, take it as soon as you remember on the same day then continue your regularly scheduled once daily regimen the next day. Do not take two doses of Xarelto on the same day.   Important Safety Information A possible side effect of Xarelto is bleeding. You should call your healthcare provider right away if you experience any of the following: ? Bleeding from an injury or your nose that does not stop. ? Unusual colored urine (red or dark brown) or unusual colored stools (red or black). ? Unusual bruising for unknown reasons. ? A serious fall or if you hit your head (even if there is no bleeding).  Some medicines may interact with Xarelto and might increase your risk of bleeding while on Xarelto. To help avoid this, consult your healthcare provider or pharmacist prior to using any new prescription or non-prescription medications, including herbals, vitamins, non-steroidal anti-inflammatory drugs (NSAIDs) and supplements.  This website has more information on Xarelto: [email or web address]

## 2020-11-25 NOTE — Transfer of Care (Signed)
Immediate Anesthesia Transfer of Care Note  Patient: Eric [name]  Procedure(s) Performed: TOTAL KNEE ARTHROPLASTY (Left Knee)  Patient Location: PACU  Anesthesia Type:Spinal and MAC combined with regional for post-op pain  Level of Consciousness: awake, alert  and oriented  Airway & Oxygen Therapy: Patient Spontanous Breathing and Patient connected to face mask oxygen  Post-op Assessment: Report given to RN and Post -op Vital signs reviewed and stable  Post vital signs: Reviewed and stable  Last Vitals:  Vitals Value Taken Time  BP    Temp    Pulse    Resp    SpO2      Last Pain:  Vitals:   11/25/20 0719  PainSc: 3       Patients Stated Pain Goal: 3 (07/37/10 6269)  Complications: No complications documented.

## 2020-11-25 NOTE — Anesthesia Procedure Notes (Signed)
Procedure Name: MAC Date/Time: 11/25/2020 9:16 AM Performed by: Lollie Sails, CRNA Pre-anesthesia Checklist: Patient identified, Emergency Drugs available, Suction available, Patient being monitored and Timeout performed Oxygen Delivery Method: Simple face mask

## 2020-11-25 NOTE — Anesthesia Postprocedure Evaluation (Signed)
Anesthesia Post Note  Patient: Eric [name]  Procedure(s) Performed: TOTAL KNEE ARTHROPLASTY (Left Knee)     Patient location during evaluation: PACU Anesthesia Type: MAC Level of consciousness: awake and alert Pain management: pain level controlled Vital Signs Assessment: post-procedure vital signs reviewed and stable Respiratory status: spontaneous breathing and respiratory function stable Cardiovascular status: blood pressure returned to baseline and stable Postop Assessment: spinal receding Anesthetic complications: no   No complications documented.  Last Vitals:  Vitals:   11/25/20 1200 11/25/20 1228  BP: 104/76 110/74  Pulse: 74 73  Resp: 19 16  Temp: (!) 36.4 C 36.6 C  SpO2: 100% 100%    Last Pain:  Vitals:   11/25/20 1228  TempSrc: Oral  PainSc:                  Stepahnie Campo DANIEL

## 2020-11-25 NOTE — Anesthesia Procedure Notes (Signed)
Spinal  Patient location during procedure: OR Start time: 11/25/2020 9:14 AM End time: 11/25/2020 9:24 AM Reason for block: surgical anesthesia Staffing Performed: anesthesiologist  Anesthesiologist: Duane Boston, MD Preanesthetic Checklist Completed: patient identified, IV checked, risks and benefits discussed, surgical consent, monitors and equipment checked, pre-op evaluation and timeout performed Spinal Block Patient position: sitting Prep: DuraPrep Patient monitoring: cardiac monitor, continuous pulse ox and blood pressure Approach: midline Location: L2-3 Injection technique: single-shot Needle Needle type: Pencan  Needle gauge: 24 G Needle length: 9 cm Assessment Events: CSF return Additional Notes Functioning IV was confirmed and monitors were applied. Sterile prep and drape, including hand hygiene and sterile gloves were used. The patient was positioned and the spine was prepped. The skin was anesthetized with lidocaine.  Free flow of clear CSF was obtained prior to injecting local anesthetic into the CSF.  The spinal needle aspirated freely following injection.  The needle was carefully withdrawn.  The patient tolerated the procedure well.

## 2020-11-25 NOTE — Progress Notes (Signed)
Assisted Dr. Singer with left, ultrasound guided, adductor canal block. Side rails up, monitors on throughout procedure. See vital signs in flow sheet. Tolerated Procedure well.  

## 2020-11-25 NOTE — Evaluation (Signed)
Physical Therapy Evaluation Patient Details Name: Eric [name] MRN: [id number] DOB: March 14, 1951 Today's Date: 11/25/2020   History of Present Illness  Pt is 70 yo male s/p L TKA on 11/25/20. Pt has medical hx including arthritis, basal cell CA, and hyperlipidemia  Clinical Impression  Pt is s/p TKA resulting in the deficits listed below (see PT Problem List). Saw pt for evaluation on DOS and he has excellent mobility, pain control, ROM, and quad activation.  Pt is very pleased and reports his chronic pain is gone - just has surgical pain.  He was able to transfer and ambulated 150' with min guard assisted.  Pt expected to progress very well with therapy.  He is very eager to start moving and return to normal activities - did caution not to overdo in first couple of weeks. Pt will benefit from skilled PT to increase their independence and safety with mobility to allow discharge to the venue listed below.      Follow Up Recommendations Follow surgeon's recommendation for DC plan and follow-up therapies;Supervision for mobility/OOB    Equipment Recommendations  None recommended by PT (has DME, declined 3 in 1)    Recommendations for Other Services       Precautions / Restrictions Precautions Precautions: Fall Restrictions LLE Weight Bearing: Weight bearing as tolerated      Mobility  Bed Mobility Overal bed mobility: Needs Assistance Bed Mobility: Supine to Sit     Supine to sit: Supervision          Transfers Overall transfer level: Needs assistance Equipment used: Rolling walker (2 wheeled) Transfers: Sit to/from Stand Sit to Stand: Min guard         General transfer comment: cues for safe hand placement and L LE management  Ambulation/Gait Ambulation/Gait assistance: Min guard Gait Distance (Feet): 150 Feet Assistive device: Rolling walker (2 wheeled) Gait Pattern/deviations: Step-to pattern;Step-through pattern Gait velocity: near normal   General Gait  Details: Initial step to gait progressing to step through pattern with near equal weight shift (slight decrease to L); cued to take his time and for RW proximity  Stairs            Wheelchair Mobility    Modified Rankin (Stroke Patients Only)       Balance Overall balance assessment: Needs assistance Sitting-balance support: No upper extremity supported Sitting balance-Leahy Scale: Good     Standing balance support: Bilateral upper extremity supported;No upper extremity supported Standing balance-Leahy Scale: Fair Standing balance comment: RW to ambulate; could static stand no AD                             Pertinent Vitals/Pain Pain Assessment: 0-10 Pain Score: 2  Pain Location: L knee Pain Descriptors / Indicators: Discomfort Pain Intervention(s): Limited activity within patient's tolerance;Monitored during session;Premedicated before session (Pt is very pleased.  REports his chronic bone pain is gone and just sore from sx)    Home Living Family/patient expects to be discharged to:: Private residence Living Arrangements: Spouse/significant other Available Help at Discharge: Family;Available 24 hours/day Type of Home: House Home Access: Stairs to enter Entrance Stairs-Rails: None Entrance Stairs-Number of Steps: 2 Home Layout: Two level;1/2 bath on main level;Bed/bath upstairs (reports could sleep on couch) Home Equipment: Shower seat - built in;Walker - 2 wheels      Prior Function Level of Independence: Independent  Hand Dominance        Extremity/Trunk Assessment   Upper Extremity Assessment Upper Extremity Assessment: Overall WFL for tasks assessed    Lower Extremity Assessment Lower Extremity Assessment: LLE deficits/detail LLE Deficits / Details: Expected post op changes; ROM: knee 5 to 90 degrees, hip and ankle WNL; MMT: ankle 5/5, hip and knee 3/5 not furhter tested LLE Sensation: WNL    Cervical / Trunk  Assessment Cervical / Trunk Assessment: Normal  Communication   Communication: No difficulties  Cognition Arousal/Alertness: Awake/alert Behavior During Therapy: WFL for tasks assessed/performed Overall Cognitive Status: Within Functional Limits for tasks assessed                                        General Comments General comments (skin integrity, edema, etc.): VSS.  Did note pt with wheezing and labored breathig - sats 98%, he reports this is baseline.  Educated on PT role and POC.  Encouraged to perform ankle pumps and quad sets tonight - pt demonstrated.    Exercises     Assessment/Plan    PT Assessment Patient needs continued PT services  PT Problem List Decreased strength;Decreased mobility;Decreased safety awareness;Decreased range of motion;Decreased activity tolerance;Decreased balance;Decreased knowledge of use of DME;Pain       PT Treatment Interventions DME instruction;Therapeutic activities;Modalities;Gait training;Therapeutic exercise;Patient/family education;Balance training;Stair training;Functional mobility training    PT Goals (Current goals can be found in the Care Plan section)  Acute Rehab PT Goals Patient Stated Goal: return home PT Goal Formulation: With patient/family Time For Goal Achievement: 12/09/20 Potential to Achieve Goals: Good    Frequency 7X/week   Barriers to discharge        Co-evaluation               AM-PAC PT "6 Clicks" Mobility  Outcome Measure Help needed turning from your back to your side while in a flat bed without using bedrails?: A Little Help needed moving from lying on your back to sitting on the side of a flat bed without using bedrails?: A Little Help needed moving to and from a bed to a chair (including a wheelchair)?: A Little Help needed standing up from a chair using your arms (e.g., wheelchair or bedside chair)?: A Little Help needed to walk in hospital room?: A Little Help needed climbing  3-5 steps with a railing? : A Little 6 Click Score: 18    End of Session Equipment Utilized During Treatment: Gait belt Activity Tolerance: Patient tolerated treatment well Patient left: with chair alarm set;in chair;with call bell/phone within reach;with family/visitor present Nurse Communication: Mobility status PT Visit Diagnosis: Other abnormalities of gait and mobility (R26.89);Muscle weakness (generalized) (M62.81)    Time: 9024-0973 PT Time Calculation (min) (ACUTE ONLY): 23 min   Charges:   PT Evaluation $PT Eval Low Complexity: 1 Low PT Treatments $Gait Training: 8-22 mins        Abran Richard, PT Acute Rehab Services Pager [phone] [facility] [place] 11/25/2020, 4:28 PM

## 2020-11-26 ENCOUNTER — Other Ambulatory Visit: Payer: Self-pay

## 2020-11-26 ENCOUNTER — Encounter (HOSPITAL_COMMUNITY): Payer: Self-pay | Admitting: Orthopedic Surgery

## 2020-11-26 DIAGNOSIS — M1712 Unilateral primary osteoarthritis, left knee: Secondary | ICD-10-CM | POA: Diagnosis not present

## 2020-11-26 LAB — BASIC METABOLIC PANEL
Anion gap: 9 (ref 5–15)
BUN: 14 mg/dL (ref 8–23)
CO2: 27 mmol/L (ref 22–32)
Calcium: 8.9 mg/dL (ref 8.9–10.3)
Chloride: 99 mmol/L (ref 98–111)
Creatinine, Ser: 1.19 mg/dL (ref 0.61–1.24)
GFR, Estimated: >60 mL/min (ref >60)
Glucose, Bld: 146 mg/dL — ABNORMAL HIGH (ref 70–99)
Potassium: 4 mmol/L (ref 3.5–5.1)
Sodium: 135 mmol/L (ref 135–145)

## 2020-11-26 LAB — CBC
HCT: 41.7 % (ref 39.0–52.0)
Hemoglobin: 13.7 g/dL (ref 13.0–17.0)
MCH: 34.9 pg — ABNORMAL HIGH (ref 26.0–34.0)
MCHC: 32.9 g/dL (ref 30.0–36.0)
MCV: 106.4 fL — ABNORMAL HIGH (ref 80.0–100.0)
Platelets: 242 10*3/uL (ref 150–400)
RBC: 3.92 MIL/uL — ABNORMAL LOW (ref 4.22–5.81)
RDW: 11.9 % (ref 11.5–15.5)
WBC: 13.5 10*3/uL — ABNORMAL HIGH (ref 4.0–10.5)
nRBC: 0 % (ref 0.0–0.2)

## 2020-11-26 MED ORDER — RIVAROXABAN 10 MG PO TABS: 10.0000 mg | ORAL_TABLET | Freq: Every day | ORAL | 0 refills | Status: DC

## 2020-11-26 MED ORDER — OXYCODONE HCL 5 MG PO TABS: 5.0000 mg | ORAL_TABLET | Freq: Four times a day (QID) | ORAL | 0 refills | Status: DC | PRN

## 2020-11-26 NOTE — TOC Transition Note (Signed)
Transition of Care [facility]) - CM/SW Discharge Note  Patient Details  Name: Eric [name] MRN: [id number] Date of Birth: Mar 28, 1951  [facility] CM/SW Contact:  Sherie Don, LCSW Phone Number: 11/26/2020, 11:44 AM  Clinical Narrative: Patient to discharge home after working with PT. CSW spoke with patient to confirm discharge plan. Per patient, he will go to OPPT through [facility] with his first appointment on 11/27/20 at 10am. Patient has a rolling walker and cane at home as well as high toilets, so a 3N1 is not needed. TOC signing off.  Final next level of care: OP Rehab Barriers to Discharge: No Barriers Identified  Patient Goals and CMS Choice Patient states their goals for this hospitalization and ongoing recovery are:: Discharge home with OPPT through [facility] Medicare.gov Compare Post Acute Care list provided to:: Patient Choice offered to / list presented to : NA  Discharge Plan and Services        DME Arranged: N/A DME Agency: NA HH Arranged: NA Early Agency: NA  Readmission Risk Interventions No flowsheet data found.

## 2020-11-26 NOTE — Progress Notes (Signed)
Physical Therapy Treatment Patient Details Name: Eric [name] MRN: [id number] DOB: [birth year] Today's Date: 11/26/2020    History of Present Illness Pt is 70 yo male s/p L TKA on 11/25/20. Pt has medical hx including arthritis, basal cell CA, and hyperlipidemia    PT Comments    Pt is POD # 1 and is progressing very well.  Excellent pain control, ROM, and quad activation.  Pt very motivated. Ambulated at least 300' and performed stairs with multiple set ups to mimic home environment. Pt demonstrates safe gait & transfers in order to return home from PT perspective once discharged by MD.  While in hospital, will continue to benefit from PT for skilled therapy to advance mobility and exercises.      Follow Up Recommendations  Follow surgeon's recommendation for DC plan and follow-up therapies;Supervision for mobility/OOB     Equipment Recommendations  None recommended by PT    Recommendations for Other Services       Precautions / Restrictions Precautions Precautions: Fall Restrictions Weight Bearing Restrictions: No LLE Weight Bearing: Weight bearing as tolerated    Mobility  Bed Mobility Overal bed mobility: Needs Assistance       Supine to sit: Supervision          Transfers Overall transfer level: Needs assistance Equipment used: Rolling walker (2 wheeled) Transfers: Sit to/from Stand Sit to Stand: Supervision            Ambulation/Gait Ambulation/Gait assistance: Supervision Gait Distance (Feet): 300 Feet Assistive device: Rolling walker (2 wheeled) Gait Pattern/deviations: Step-through pattern;Decreased stride length     General Gait Details: Initial step to gait progressing to step through pattern with near equal weight shift (slight decrease to L); cued for RW proximity initially; also educated on setting height for his walker at home   Stairs Stairs: Yes Stairs assistance: Min guard Stair Management: Two rails;No rails;Forwards;With  cane Number of Stairs: 15 General stair comments: Educated on sequencing; up/down 7 steps with bil rail to mimic inside steps; then up/down 4 with HHA and up/down 4 with cane to mimic outside steps with no rails; pt preferred with cane; discussed having wife stop to get cane at medical supply/pharmacy store   Wheelchair Mobility    Modified Rankin (Stroke Patients Only)       Balance Overall balance assessment: Needs assistance Sitting-balance support: No upper extremity supported Sitting balance-Leahy Scale: Normal     Standing balance support: Bilateral upper extremity supported;No upper extremity supported Standing balance-Leahy Scale: Good Standing balance comment: RW to ambulate; could static stand and weight shift no AD; Pt took a couple steps at bedside without RW - was steady but advised to use AD                            Cognition Arousal/Alertness: Awake/alert Behavior During Therapy: WFL for tasks assessed/performed Overall Cognitive Status: Within Functional Limits for tasks assessed                                        Exercises Total Joint Exercises Ankle Circles/Pumps: AROM;Both;10 reps;Supine Quad Sets: AROM;Supine;Both;10 reps Towel Squeeze: AROM;Supine;Both;10 reps Heel Slides: AROM;Supine;10 reps;Left Hip ABduction/ADduction: AROM;Supine;Left;10 reps Long Arc Quad: AROM;Seated;Left;10 reps Knee Flexion: AROM;Seated;Left;10 reps Goniometric ROM: L knee 5 to 85 (in ACE wrap)    General Comments   Educated on safe ice  use, no pivots, car transfers, resting with leg straight, and TED hose during day. Also, encouraged walking every 1-2 hours during day. Educated on HEP with focus on mobility the first weeks. Discussed doing exercises within pain control and if pain increasing could decreased ROM, reps, and stop exercises as needed. Encouraged to perform quad sets and ankle pumps frequently for blood flow and to promote full knee  extension.       Pertinent Vitals/Pain Pain Assessment: 0-10 Pain Score: 1  Pain Location: L knee Pain Descriptors / Indicators: Discomfort Pain Intervention(s): Monitored during session;Limited activity within patient's tolerance    Home Living                      Prior Function            PT Goals (current goals can now be found in the care plan section) Acute Rehab PT Goals Patient Stated Goal: return home PT Goal Formulation: With patient/family Time For Goal Achievement: 12/09/20 Potential to Achieve Goals: Good Progress towards PT goals: Progressing toward goals    Frequency    7X/week      PT Plan Current plan remains appropriate    Co-evaluation              AM-PAC PT "6 Clicks" Mobility   Outcome Measure  Help needed turning from your back to your side while in a flat bed without using bedrails?: None Help needed moving from lying on your back to sitting on the side of a flat bed without using bedrails?: A Little Help needed moving to and from a bed to a chair (including a wheelchair)?: A Little Help needed standing up from a chair using your arms (e.g., wheelchair or bedside chair)?: A Little Help needed to walk in hospital room?: A Little Help needed climbing 3-5 steps with a railing? : A Little 6 Click Score: 19    End of Session Equipment Utilized During Treatment: Gait belt Activity Tolerance: Patient tolerated treatment well Patient left: with chair alarm set;in chair;with call bell/phone within reach Nurse Communication: Mobility status PT Visit Diagnosis: Other abnormalities of gait and mobility (R26.89);Muscle weakness (generalized) (M62.81)     Time: 7564-3329 PT Time Calculation (min) (ACUTE ONLY): 32 min  Charges:  $Gait Training: 8-22 mins $Therapeutic Exercise: 8-22 mins                     Abran Richard, PT Acute Rehab Services Pager [phone] [facility] [place] 11/26/2020, 11:37  AM

## 2020-11-26 NOTE — Progress Notes (Signed)
   Subjective: 1 Day Post-Op Procedure(s) (LRB): TOTAL KNEE ARTHROPLASTY (Left) Patient reports pain as mild.   Patient seen in rounds by Dr. Wynelle Link. Patient is well, and has had no acute complaints or problems. States he is ready to go home. Denies chest pain, SOB, or calf pain. No issues overnight, foley catheter removed yesterday. We will continue therapy today, ambulated 150' with first session.   Objective: Vital signs in last 24 hours: Temp:  [96 F (35.6 C)-99.1 F (37.3 C)] 99.1 F (37.3 C) (06/07 0624) Pulse Rate:  [54-109] 54 (06/07 0624) Resp:  [10-22] 16 (06/07 0624) BP: (104-162)/(74-102) 117/80 (06/07 0624) SpO2:  [94 %-100 %] 99 % (06/07 0624)  Intake/Output from previous day:  Intake/Output Summary (Last 24 hours) at 11/26/2020 0743 Last data filed at 11/26/2020 0700 Gross per 24 hour  Intake 3730.58 ml  Output 2425 ml  Net 1305.58 ml     Intake/Output this shift: No intake/output data recorded.  Labs: Recent Labs    11/26/20 0322  HGB 13.7   Recent Labs    11/26/20 0322  WBC 13.5*  RBC 3.92*  HCT 41.7  PLT 242   Recent Labs    11/26/20 0322  NA 135  K 4.0  CL 99  CO2 27  BUN 14  CREATININE 1.19  GLUCOSE 146*  CALCIUM 8.9   No results for input(s): LABPT, INR in the last 72 hours.  Exam: General - Patient is Alert and Oriented Extremity - Neurologically intact Neurovascular intact Sensation intact distally Dorsiflexion/Plantar flexion intact Dressing - dressing C/D/I Motor Function - intact, moving foot and toes well on exam.   Past Medical History:  Diagnosis Date  . Arteriosclerosis of aorta ([place])   . Arthritis   . Basal cell carcinoma   . GERD (gastroesophageal reflux disease)   . Hyperlipidemia   . Labile hypertension    pt denies   . Other testicular hypofunction   . Prediabetes    pt denies at preop of 11/14/20   . Vitamin D deficiency     Assessment/Plan: 1 Day Post-Op Procedure(s) (LRB): TOTAL KNEE ARTHROPLASTY  (Left) Principal Problem:   OA (osteoarthritis) of knee  Estimated body mass index is 30.38 kg/m as calculated from the following:   Height as of this encounter: 6' (1.829 m).   Weight as of this encounter: 101.6 kg. Advance diet Up with therapy D/C IV fluids   Patient's anticipated LOS is less than 2 midnights, meeting these requirements: - Younger than 23 - Lives within 1 hour of care - Has a competent adult at home to recover with post-op recover - NO history of  - Chronic pain requiring opiods  - Diabetes  - Coronary Artery Disease  - Heart failure  - Heart attack  - Stroke  - DVT/VTE  - Cardiac arrhythmia  - Respiratory Failure/COPD  - Renal failure  - Anemia  - Advanced Liver disease  DVT Prophylaxis - Xarelto Weight bearing as tolerated. Continue therapy.  Plan is to go Home after hospital stay. Plan for discharge after one session of PT this AM. Scheduled for OPPT at [facility] Follow-up in the office in 2 weeks  The [place] was reviewed today prior to any opioid medications being prescribed to this patient.  Theresa Duty, PA-C Orthopedic Surgery [phone] 11/26/2020, 7:43 AM

## 2020-11-26 NOTE — Plan of Care (Signed)
Plan of care reviewed and discussed with the patient. 

## 2020-11-27 DIAGNOSIS — M25562 Pain in left knee: Secondary | ICD-10-CM | POA: Diagnosis not present

## 2020-11-27 DIAGNOSIS — M25662 Stiffness of left knee, not elsewhere classified: Secondary | ICD-10-CM | POA: Diagnosis not present

## 2020-12-02 DIAGNOSIS — M25562 Pain in left knee: Secondary | ICD-10-CM | POA: Diagnosis not present

## 2020-12-02 DIAGNOSIS — M25662 Stiffness of left knee, not elsewhere classified: Secondary | ICD-10-CM | POA: Diagnosis not present

## 2020-12-04 DIAGNOSIS — M25662 Stiffness of left knee, not elsewhere classified: Secondary | ICD-10-CM | POA: Diagnosis not present

## 2020-12-04 DIAGNOSIS — M25562 Pain in left knee: Secondary | ICD-10-CM | POA: Diagnosis not present

## 2020-12-06 DIAGNOSIS — M25662 Stiffness of left knee, not elsewhere classified: Secondary | ICD-10-CM | POA: Diagnosis not present

## 2020-12-06 DIAGNOSIS — M25562 Pain in left knee: Secondary | ICD-10-CM | POA: Diagnosis not present

## 2020-12-11 DIAGNOSIS — M25662 Stiffness of left knee, not elsewhere classified: Secondary | ICD-10-CM | POA: Diagnosis not present

## 2020-12-11 DIAGNOSIS — M25562 Pain in left knee: Secondary | ICD-10-CM | POA: Diagnosis not present

## 2020-12-17 DIAGNOSIS — M25662 Stiffness of left knee, not elsewhere classified: Secondary | ICD-10-CM | POA: Diagnosis not present

## 2020-12-17 DIAGNOSIS — M25562 Pain in left knee: Secondary | ICD-10-CM | POA: Diagnosis not present

## 2020-12-20 DIAGNOSIS — M25562 Pain in left knee: Secondary | ICD-10-CM | POA: Diagnosis not present

## 2020-12-20 DIAGNOSIS — M25662 Stiffness of left knee, not elsewhere classified: Secondary | ICD-10-CM | POA: Diagnosis not present

## 2020-12-31 DIAGNOSIS — Z96652 Presence of left artificial knee joint: Secondary | ICD-10-CM | POA: Diagnosis not present

## 2020-12-31 DIAGNOSIS — Z471 Aftercare following joint replacement surgery: Secondary | ICD-10-CM | POA: Diagnosis not present

## 2021-01-17 LAB — HM COLONOSCOPY

## 2021-01-29 ENCOUNTER — Other Ambulatory Visit: Payer: Self-pay

## 2021-01-29 ENCOUNTER — Ambulatory Visit (INDEPENDENT_AMBULATORY_CARE_PROVIDER_SITE_OTHER): Payer: PPO | Admitting: Internal Medicine

## 2021-01-29 ENCOUNTER — Encounter: Payer: Self-pay | Admitting: Internal Medicine

## 2021-01-29 VITALS — BP 131/89 | HR 96 | Temp 97.9°F | Resp 16 | Ht 72.0 in | Wt 220.2 lb

## 2021-01-29 DIAGNOSIS — E782 Mixed hyperlipidemia: Secondary | ICD-10-CM | POA: Diagnosis not present

## 2021-01-29 DIAGNOSIS — Z0000 Encounter for general adult medical examination without abnormal findings: Secondary | ICD-10-CM | POA: Diagnosis not present

## 2021-01-29 DIAGNOSIS — Z125 Encounter for screening for malignant neoplasm of prostate: Secondary | ICD-10-CM | POA: Diagnosis not present

## 2021-01-29 DIAGNOSIS — Z87891 Personal history of nicotine dependence: Secondary | ICD-10-CM

## 2021-01-29 DIAGNOSIS — Z136 Encounter for screening for cardiovascular disorders: Secondary | ICD-10-CM | POA: Diagnosis not present

## 2021-01-29 DIAGNOSIS — R0989 Other specified symptoms and signs involving the circulatory and respiratory systems: Secondary | ICD-10-CM

## 2021-01-29 DIAGNOSIS — R7303 Prediabetes: Secondary | ICD-10-CM

## 2021-01-29 DIAGNOSIS — Z8249 Family history of ischemic heart disease and other diseases of the circulatory system: Secondary | ICD-10-CM | POA: Diagnosis not present

## 2021-01-29 DIAGNOSIS — R7309 Other abnormal glucose: Secondary | ICD-10-CM | POA: Diagnosis not present

## 2021-01-29 DIAGNOSIS — I700 Atherosclerosis of aorta: Secondary | ICD-10-CM | POA: Diagnosis not present

## 2021-01-29 DIAGNOSIS — N138 Other obstructive and reflux uropathy: Secondary | ICD-10-CM | POA: Diagnosis not present

## 2021-01-29 DIAGNOSIS — Z1211 Encounter for screening for malignant neoplasm of colon: Secondary | ICD-10-CM

## 2021-01-29 DIAGNOSIS — N401 Enlarged prostate with lower urinary tract symptoms: Secondary | ICD-10-CM

## 2021-01-29 DIAGNOSIS — Z79899 Other long term (current) drug therapy: Secondary | ICD-10-CM | POA: Diagnosis not present

## 2021-01-29 DIAGNOSIS — Z0001 Encounter for general adult medical examination with abnormal findings: Secondary | ICD-10-CM

## 2021-01-29 DIAGNOSIS — E559 Vitamin D deficiency, unspecified: Secondary | ICD-10-CM

## 2021-01-29 DIAGNOSIS — J439 Emphysema, unspecified: Secondary | ICD-10-CM

## 2021-01-29 MED ORDER — ROSUVASTATIN CALCIUM 5 MG PO TABS: ORAL_TABLET | ORAL | 3 refills | Status: DC

## 2021-01-29 MED ORDER — CITALOPRAM HYDROBROMIDE 40 MG PO TABS: ORAL_TABLET | ORAL | 3 refills | Status: DC

## 2021-01-29 NOTE — Patient Instructions (Signed)

## 2021-01-29 NOTE — Progress Notes (Addendum)
Annual  Screening/Preventative Visit  & Comprehensive Evaluation & Examination  Future Appointments  Date Time Provider [place]  01/29/2021  3:00 PM Unk Pinto, MD [facility] None  06/12/2021 11:00 AM Liane Comber, NP [facility] None  01/29/2022  3:00 PM Unk Pinto, MD [facility] None            This very nice 70 y.o. MWM presents for a Screening /Preventative Visit & comprehensive evaluation and management of multiple medical co-morbidities.  Patient has been followed for labile HTN, HLD, Prediabetes and Vitamin D Deficiency. Patient has COPD/Emphysema consequent 50+ pack year smoking        Patient is followed expectantly predates since 2002 for labile HTN. Patient's BP has been controlled at home.  Today's BP is at goal - 131/89. Patient denies any cardiac symptoms as chest pain, palpitations, shortness of breath, dizziness or ankle swelling.       Patient's hyperlipidemia was controlled with diet and medications, but today reports off of his Rosuvastatin x 2 months. . Patient denies myalgias or other medication SE's. Last lipids were  Lab Results  Component Value Date   CHOL 169 09/12/2020   HDL 46 09/12/2020   LDLCALC 84 09/12/2020   TRIG 320 (H) 09/12/2020   CHOLHDL 3.7 09/12/2020         Patient has hx/o prediabetes (A1c 5.8% /2013) and patient denies reactive hypoglycemic symptoms, visual blurring, diabetic polys or paresthesias. Last A1c was normal & at goal :   Lab Results  Component Value Date   HGBA1C 5.4 01/30/2020          Finally, patient has history of Vitamin D Deficiency ("27" /2008) and last vitamin D was at goal:   Lab Results  Component Value Date   VD25OH 60 01/30/2020     Current Outpatient Medications on File Prior to Visit  Medication Sig   acetaminophen 500 MG tablet Take 1,000 mg by mouth every 6 (six) hours as needed for moderate pain.   TUMS 500 MG tablet Chew 1,000 mg by mouth daily as needed for indigestion or  heartburn.    Allergies  Allergen Reactions   Lipitor [Atorvastatin] Cloudy headed        Lopid [Gemfibrozil] Unknown reaction        Penicillins Unknown childhood reaction     Tolerated Cephalosporin Date: 11/26/20.     Past Medical History:  Diagnosis Date   Arteriosclerosis of aorta ([place])    Arthritis    Basal cell carcinoma    GERD (gastroesophageal reflux disease)    Hyperlipidemia    Labile hypertension    pt denies    Other testicular hypofunction    Prediabetes    (A1c 5.8% / 2013)   Vitamin D deficiency      Health Maintenance  Topic Date Due   Zoster Vaccines- Shingrix (1 of 2) Never done   COVID-19 Vaccine (4 - Booster for Moderna series) 07/17/2020   Fecal DNA (Cologuard)  12/27/2020   INFLUENZA VACCINE  01/20/2021   TETANUS/TDAP  05/10/2028   PNA vac Low Risk Adult  Completed   HPV VACCINES  Aged Out   Hepatitis C Screening  Discontinued     Immunization History  Administered Date(s) Administered   Influenza Split 04/13/2013, 04/06/2014, 04/15/2015   Influenza, High Dose  05/10/2018, 04/23/2019, 04/16/2020   Moderna Sars-Covid-2 Vacc 08/04/2019, 09/01/2019, 04/16/2020   Pneumococcal 13 10/25/2015   Pneumococcal 23 12/25/2015   Pneumococcal-23 06/22/2002   Tdap 09/17/2011, 05/10/2018  Last Colon - 08/01/2004 - Dr Earlean Shawl   Cologard - 12/27/2017 - Negative - recc 3 yr f/u due July   2022  Past Surgical History:  Procedure Laterality Date   CATARACT EXTRACTION, BILATERAL Bilateral 2019   Dr. Carmell Austria   dental implant surgery      KNEE ARTHROSCOPY Left 1988   TOTAL KNEE ARTHROPLASTY Left 11/25/2020   Procedure: TOTAL KNEE ARTHROPLASTY;  Surgeon: Gaynelle Arabian, MD;  Location: [facility];  Service: Orthopedics;  Laterality: Left;  27mn     Family History  Problem Relation Age of Onset   Diabetes Mother    Alzheimer's disease Mother    Heart disease Father    Hypertension Father    Stroke Father    Colon polyps Father     Social  History   Socioeconomic History   Marital status: Married    Spouse name: SManuela Schwartz  Number of children: 1 daughter  Occupational History   Retired [employer] officer, political party Tobacco Use   Smoking status: Former    Packs/day: 1.50    Years: 37.00    Pack years: 55.50    Types: Cigarettes    Start date: 06/08/1968    Quit date: 10/10/2004    Years since quitting: 16.3   Smokeless tobacco: Never  Vaping Use   Vaping Use: Never used  Substance and Sexual Activity   Alcohol use: Yes    Alcohol/week: 0.0 standard drinks    Comment: 5 drinks per week    Drug use: No   Sexual activity: Not on file    ROS Constitutional: Denies fever, chills, weight loss/gain, headaches, insomnia,  night sweats or change in appetite. Does c/o fatigue. Eyes: Denies redness, blurred vision, diplopia, discharge, itchy or watery eyes.  ENT: Denies discharge, congestion, post nasal drip, epistaxis, sore throat, earache, hearing loss, dental pain, Tinnitus, Vertigo, Sinus pain or snoring.  Cardio: Denies chest pain, palpitations, irregular heartbeat, syncope, dyspnea, diaphoresis, orthopnea, PND, claudication or edema Respiratory: denies cough, dyspnea, DOE, pleurisy, hoarseness, laryngitis or wheezing.  Gastrointestinal: Denies dysphagia, heartburn, reflux, water brash, pain, cramps, nausea, vomiting, bloating, diarrhea, constipation, hematemesis, melena, hematochezia, jaundice or hemorrhoids Genitourinary: Denies dysuria, frequency, urgency, nocturia, hesitancy, discharge, hematuria or flank pain Musculoskeletal: Denies arthralgia, myalgia, stiffness, Jt. Swelling, pain, limp or strain/sprain. Denies Falls. Skin: Denies puritis, rash, hives, warts, acne, eczema or change in skin lesion Neuro: No weakness, tremor, incoordination, spasms, paresthesia or pain Psychiatric: Denies confusion, memory loss or sensory loss. Denies Depression. Endocrine: Denies change in weight, skin, hair change, nocturia, and  paresthesia, diabetic polys, visual blurring or hyper / hypo glycemic episodes.  Heme/Lymph: No excessive bleeding, bruising or enlarged lymph nodes.   Physical Exam  BP 131/89   Pulse 96   Temp 97.9 F (36.6 C)   Resp 16   Ht 6' (1.829 m)   Wt 220 lb 3.2 oz (99.9 kg)   SpO2 97%   BMI 29.86 kg/m   General Appearance: Well nourished and well groomed and in no apparent distress.  Eyes: PERRLA, EOMs, conjunctiva no swelling or erythema, normal fundi and vessels. Sinuses: No frontal/maxillary tenderness ENT/Mouth: EACs patent / TMs  nl. Nares clear without erythema, swelling, mucoid exudates. Oral hygiene is good. No erythema, swelling, or exudate. Tongue normal, non-obstructing. Tonsils not swollen or erythematous. Hearing normal.  Neck: Supple, thyroid not palpable. No bruits, nodes or JVD. Respiratory: Respiratory effort normal.  BS equal and clear bilateral without rales, rhonci, wheezing or stridor. Cardio: Heart  sounds are normal with regular rate and rhythm and no murmurs, rubs or gallops. Peripheral pulses are normal and equal bilaterally without edema. No aortic or femoral bruits. Chest: symmetric with normal excursions and percussion.  Abdomen: Soft, with Nl bowel sounds. Nontender, no guarding, rebound, hernias, masses, or organomegaly.  Lymphatics: Non tender without lymphadenopathy.  Musculoskeletal: Full ROM all peripheral extremities, joint stability, 5/5 strength, and normal gait. Skin: Warm and dry without rashes, lesions, cyanosis, clubbing or  ecchymosis.  Neuro: Cranial nerves intact, reflexes equal bilaterally. Normal muscle tone, no cerebellar symptoms. Sensation intact.  Pysch: Alert and oriented X 3 with normal affect, insight and judgment appropriate.   Assessment and Plan  1. Annual Preventative/Screening Exam    2. Labile hypertension  - EKG 12-Lead - [place], RETROPERITNL ABD,  LTD - Urinalysis, Routine w reflex microscopic - Microalbumin / creatinine  urine ratio - POC Hemoccult Bld/Stl (3-Cd Home Screen); Future - CBC with Differential/Platelet - COMPLETE METABOLIC PANEL WITH GFR - Magnesium - TSH  3. Hyperlipidemia, mixed  - EKG 12-Lead - [place], RETROPERITNL ABD,  LTD - Lipid panel - TSH  - rosuvastatin (CRESTOR) 5 MG tablet;  Take 1 tab three days / week (MWF)   Disp 39 tablet; Refill: 3  4. Abnormal glucose  - EKG 12-Lead - [place], RETROPERITNL ABD,  LTD - Hemoglobin A1c - Insulin, random  5. Vitamin D deficiency  - VITAMIN D 25 Hydroxy   6. Prediabetes  - EKG 12-Lead - [place], RETROPERITNL ABD,  LTD - Hemoglobin A1c - Insulin, random  7. Atherosclerosis of aorta ([place]) by Chest CT scan in 06/2018  - EKG 12-Lead - [place], RETROPERITNL ABD,  LTD  8. Screening for colorectal cancer  - Cologuard  9. BPH with obstruction/lower urinary tract symptoms  - PSA  10. Prostate cancer screening  - PSA  11. Screening for ischemic heart disease  - EKG 12-Lead  12. FHx: heart disease  - EKG 12-Lead - [place], RETROPERITNL ABD,  LTD  13. Screening for AAA (aortic abdominal aneurysm)  - [place], RETROPERITNL ABD,  LTD  14. Former smoker  - EKG 12-Lead - [place], RETROPERITNL ABD,  LTD  15. Pulmonary emphysema  ([place])   16. Medication management  - Urinalysis, Routine w reflex microscopic - Microalbumin / creatinine urine ratio - CBC with Differential/Platelet - COMPLETE METABOLIC PANEL WITH GFR - Magnesium - Lipid panel - TSH - Hemoglobin A1c - Insulin, random - VITAMIN D 25 Hydroxy            Patient was counseled in prudent diet, weight control to achieve/maintain BMI less than 25, BP monitoring, regular exercise and medications as discussed.  Discussed med effects and SE's. Routine screening labs and tests as requested with regular follow-up as recommended. Over 40 minutes of exam, counseling, chart review and high complex critical decision making was performed   Kirtland Bouchard, MD

## 2021-01-30 LAB — MAGNESIUM: Magnesium: 2.1 mg/dL (ref 1.5–2.5)

## 2021-01-30 LAB — URINALYSIS, ROUTINE W REFLEX MICROSCOPIC
Bacteria, UA: NONE SEEN /HPF
Bilirubin Urine: NEGATIVE
Glucose, UA: NEGATIVE
Hgb urine dipstick: NEGATIVE
Hyaline Cast: NONE SEEN /LPF
Ketones, ur: NEGATIVE
Nitrite: NEGATIVE
Protein, ur: NEGATIVE
RBC / HPF: NONE SEEN /HPF (ref 0–2)
Specific Gravity, Urine: 1.02 (ref 1.001–1.035)
Squamous Epithelial / HPF: NONE SEEN /HPF (ref <=5)
WBC, UA: NONE SEEN /HPF (ref 0–5)
pH: <=5 (ref 5.0–8.0)

## 2021-01-30 LAB — COMPLETE METABOLIC PANEL WITH GFR
AG Ratio: 1.7 (calc) (ref 1.0–2.5)
ALT: 39 U/L (ref 9–46)
AST: 30 U/L (ref 10–35)
Albumin: 4.5 g/dL (ref 3.6–5.1)
Alkaline phosphatase (APISO): 91 U/L (ref 35–144)
BUN: 13 mg/dL (ref 7–25)
CO2: 30 mmol/L (ref 20–32)
Calcium: 10 mg/dL (ref 8.6–10.3)
Chloride: 100 mmol/L (ref 98–110)
Creat: 1.08 mg/dL (ref 0.70–1.28)
Globulin: 2.6 g/dL (calc) (ref 1.9–3.7)
Glucose, Bld: 129 mg/dL — ABNORMAL HIGH (ref 65–99)
Potassium: 3.9 mmol/L (ref 3.5–5.3)
Sodium: 138 mmol/L (ref 135–146)
Total Bilirubin: 0.8 mg/dL (ref 0.2–1.2)
Total Protein: 7.1 g/dL (ref 6.1–8.1)
eGFR: 74 mL/min/{1.73_m2} (ref >=60)

## 2021-01-30 LAB — CBC WITH DIFFERENTIAL/PLATELET
Absolute Monocytes: 436 cells/uL (ref 200–950)
Basophils Absolute: 54 cells/uL (ref 0–200)
Basophils Relative: 0.8 %
Eosinophils Absolute: 60 cells/uL (ref 15–500)
Eosinophils Relative: 0.9 %
HCT: 46.7 % (ref 38.5–50.0)
Hemoglobin: 16 g/dL (ref 13.2–17.1)
Lymphs Abs: 2566 cells/uL (ref 850–3900)
MCH: 34.3 pg — ABNORMAL HIGH (ref 27.0–33.0)
MCHC: 34.3 g/dL (ref 32.0–36.0)
MCV: 100 fL (ref 80.0–100.0)
MPV: 9.4 fL (ref 7.5–12.5)
Monocytes Relative: 6.5 %
Neutro Abs: 3585 cells/uL (ref 1500–7800)
Neutrophils Relative %: 53.5 %
Platelets: 305 10*3/uL (ref 140–400)
RBC: 4.67 10*6/uL (ref 4.20–5.80)
RDW: 11.8 % (ref 11.0–15.0)
Total Lymphocyte: 38.3 %
WBC: 6.7 10*3/uL (ref 3.8–10.8)

## 2021-01-30 LAB — MICROALBUMIN / CREATININE URINE RATIO
Creatinine, Urine: 230 mg/dL (ref 20–320)
Microalb Creat Ratio: 5 mcg/mg creat (ref <30)
Microalb, Ur: 1.2 mg/dL

## 2021-01-30 LAB — LIPID PANEL
Cholesterol: 167 mg/dL (ref <200)
HDL: 41 mg/dL (ref >=40)
LDL Cholesterol (Calc): 93 mg/dL (calc)
Non-HDL Cholesterol (Calc): 126 mg/dL (calc) (ref <130)
Total CHOL/HDL Ratio: 4.1 (calc) (ref <5.0)
Triglycerides: 240 mg/dL — ABNORMAL HIGH (ref <150)

## 2021-01-30 LAB — MICROSCOPIC MESSAGE

## 2021-01-30 LAB — HEMOGLOBIN A1C
Hgb A1c MFr Bld: 5.2 % of total Hgb (ref <5.7)
Mean Plasma Glucose: 103 mg/dL
eAG (mmol/L): 5.7 mmol/L

## 2021-01-30 LAB — TSH: TSH: 2.03 mIU/L (ref 0.40–4.50)

## 2021-01-30 LAB — VITAMIN D 25 HYDROXY (VIT D DEFICIENCY, FRACTURES): Vit D, 25-Hydroxy: 54 ng/mL (ref 30–100)

## 2021-01-30 LAB — PSA: PSA: 0.38 ng/mL (ref <=4.00)

## 2021-01-30 LAB — INSULIN, RANDOM: Insulin: 65.8 u[IU]/mL — ABNORMAL HIGH

## 2021-01-30 NOTE — Progress Notes (Signed)
============================================================ -   Test results slightly outside the reference range are not unusual. If there is anything important, I will review this with you,  otherwise it is considered normal test values.  If you have further questions,  please do not hesitate to contact me at the office or via My Chart.  ============================================================ ============================================================  -  PSA  -  Low  -  Great  ============================================================ ============================================================  -  Total  Chol = 167     &      LDL  Chol = 93  -   Both  Excellent   - Very low risk for Heart Attack  / Stroke ============================================================ ============================================================  Triglycerides (   240   ) or fats in blood are too high  (goal is less than 150)    - Recommend avoid fried & greasy foods,  sweets / candy,   - Avoid white rice  (brown or wild rice or Quinoa is OK),   - Avoid white potatoes  (sweet potatoes are OK)   - Avoid anything made from white flour  - bagels, doughnuts, rolls, buns, biscuits, white and   wheat breads, pizza crust and traditional  pasta made of white flour & egg white  - (vegetarian pasta or spinach or wheat pasta is OK).    - Multi-grain bread is OK - like multi-grain flat bread or  sandwich thins.   - Avoid alcohol in excess.   - Exercise is also important. ============================================================ ============================================================  -  All Else - CBC - Kidneys - Electrolytes - Liver - Magnesium & Thyroid    - all  Normal / OK ============================================================ ============================================================

## 2021-02-01 ENCOUNTER — Encounter: Payer: Self-pay | Admitting: Internal Medicine

## 2021-02-05 DIAGNOSIS — Z1212 Encounter for screening for malignant neoplasm of rectum: Secondary | ICD-10-CM | POA: Diagnosis not present

## 2021-02-05 DIAGNOSIS — Z1211 Encounter for screening for malignant neoplasm of colon: Secondary | ICD-10-CM | POA: Diagnosis not present

## 2021-02-17 ENCOUNTER — Other Ambulatory Visit: Payer: Self-pay | Admitting: Internal Medicine

## 2021-02-17 DIAGNOSIS — R195 Other fecal abnormalities: Secondary | ICD-10-CM

## 2021-02-17 LAB — COLOGUARD: Cologuard: POSITIVE — AB

## 2021-02-17 NOTE — Progress Notes (Signed)
============================================================ ============================================================  -  (+)   Positive Cologard   ( was negative 3 years ago )   - Will refer back to Dr Earlean Shawl (patient's choice)   - Discussed with patient   ============================================================ ============================================================

## 2021-03-13 DIAGNOSIS — Z8371 Family history of colonic polyps: Secondary | ICD-10-CM | POA: Diagnosis not present

## 2021-03-13 DIAGNOSIS — K635 Polyp of colon: Secondary | ICD-10-CM | POA: Diagnosis not present

## 2021-03-13 DIAGNOSIS — Z1211 Encounter for screening for malignant neoplasm of colon: Secondary | ICD-10-CM | POA: Diagnosis not present

## 2021-03-13 DIAGNOSIS — D125 Benign neoplasm of sigmoid colon: Secondary | ICD-10-CM | POA: Diagnosis not present

## 2021-03-13 DIAGNOSIS — D122 Benign neoplasm of ascending colon: Secondary | ICD-10-CM | POA: Diagnosis not present

## 2021-03-13 DIAGNOSIS — R195 Other fecal abnormalities: Secondary | ICD-10-CM | POA: Diagnosis not present

## 2021-03-13 DIAGNOSIS — K648 Other hemorrhoids: Secondary | ICD-10-CM | POA: Diagnosis not present

## 2021-03-13 DIAGNOSIS — K5730 Diverticulosis of large intestine without perforation or abscess without bleeding: Secondary | ICD-10-CM | POA: Diagnosis not present

## 2021-03-13 DIAGNOSIS — D124 Benign neoplasm of descending colon: Secondary | ICD-10-CM | POA: Diagnosis not present

## 2021-03-13 LAB — HM COLONOSCOPY

## 2021-04-02 ENCOUNTER — Encounter: Payer: Self-pay | Admitting: Internal Medicine

## 2021-04-09 ENCOUNTER — Encounter: Payer: Self-pay | Admitting: Internal Medicine

## 2021-04-09 ENCOUNTER — Other Ambulatory Visit: Payer: Self-pay

## 2021-04-09 ENCOUNTER — Ambulatory Visit (INDEPENDENT_AMBULATORY_CARE_PROVIDER_SITE_OTHER): Payer: PPO | Admitting: Internal Medicine

## 2021-04-09 VITALS — BP 130/78 | HR 76 | Temp 97.9°F | Resp 16 | Ht 72.0 in | Wt 220.6 lb

## 2021-04-09 DIAGNOSIS — E782 Mixed hyperlipidemia: Secondary | ICD-10-CM | POA: Diagnosis not present

## 2021-04-09 DIAGNOSIS — Z79899 Other long term (current) drug therapy: Secondary | ICD-10-CM | POA: Diagnosis not present

## 2021-04-09 DIAGNOSIS — Z01818 Encounter for other preprocedural examination: Secondary | ICD-10-CM

## 2021-04-09 DIAGNOSIS — R0989 Other specified symptoms and signs involving the circulatory and respiratory systems: Secondary | ICD-10-CM

## 2021-04-09 DIAGNOSIS — R7309 Other abnormal glucose: Secondary | ICD-10-CM

## 2021-04-09 DIAGNOSIS — D689 Coagulation defect, unspecified: Secondary | ICD-10-CM | POA: Diagnosis not present

## 2021-04-09 NOTE — Progress Notes (Signed)
    Future Appointments  Date Time Provider [place]  04/09/2021  2:30 PM Unk Pinto, MD [facility] None  04/30/2021 11:00 AM [facility] PAT 2 [facility] None  06/12/2021 11:00 AM Liane Comber, NP [facility] None  01/29/2022  3:00 PM Unk Pinto, MD [facility] None    History of Present Illness:     This very nice 71 y.o. MWM  with labile HTN, HLD, COPD/Emphysema, Prediabetes and Vitamin D Deficiency. Patient presents for pre-op evaluation before scheduled Rt TKR on 05/05/21 by Dr Wynelle Link.  On 11/25/2020 , patient underwent Lt TKR & experienced benign post op recover with apparent good results.  He denies an past , recent or current cardiac or respiratory sx's.   Medications     rosuvastatin 5 MG tablet, Take 1 tab three days / week (MWF) for cholesterol    acetaminophen  500 MG tablet, Take 1,000 mg very 6 (six) hours as needed    calcium carbonate (TUMS) 500 MG  Chew 1,000 mg daily as needed for indigestion   citalopram  40 MG tablet, Take 1 tablet Daily   Problem list He has Labile hypertension; Abnormal glucose; Hyperlipidemia, mixed; Vitamin D deficiency; Major depression, recurrent, chronic ([place]); Medication management; Obesity (BMI 30.0-34.9); History of smoking greater than 50 pack years (quit 2006); Atherosclerosis of aorta ([place]) by Chest CT scan in 06/2018; Emphysema of lung [facility]); BPH with obstruction/lower urinary tract symptoms; History of basal cell cancer; Chronic pain of both knees; B12 deficiency; and OA (osteoarthritis) of knee on their problem list.   Observations/Objective:  BP 130/78   Pulse 76   Temp 97.9 F (36.6 C)   Resp 16   Ht 6' (1.829 m)   Wt 220 lb 9.6 oz (100.1 kg)   SpO2 98%   BMI 29.92 kg/m   HEENT - WNL. Neck - supple.  Chest - Clear equal BS. Cor - Nl HS. RRR w/o sig MGR. PP 1(+). No edema. MS- FROM w/o deformities.  Gait Nl. Neuro -  Nl w/o focal abnormalities.   Assessment and Plan:  1. Labile hypertension  - CBC  with Differential/Platelet - COMPLETE METABOLIC PANEL WITH GFR  2. Hyperlipidemia, mixed  3. Abnormal glucose  - COMPLETE METABOLIC PANEL WITH GFR  4. Preoperative testing  - Protime-INR  5. Medication management  - CBC with Differential/Platelet - COMPLETE METABOLIC PANEL WITH GFR  6. Coagulation disorder ([place])  - Protime-INR   Follow Up Instructions:  Patient is cleared for scheduled surgery w/o restrictions       I discussed the assessment and treatment plan with the patient. The patient was provided an opportunity to ask questions and all were answered. The patient agreed with the plan and demonstrated an understanding of the instructions.       The patient was advised to call back or seek an in-person evaluation if the symptoms worsen or if the condition fails to improve as anticipated.   Kirtland Bouchard, MD

## 2021-04-09 NOTE — Patient Instructions (Addendum)
Preparing for Knee Replacement Getting prepared before knee replacement surgery can make recovery easier and more comfortable. This document provides some tips and guidelines that will help you prepare for your surgery. Talk with your health care provider so you can learn what to expect before, during, and after surgery. Ask questions if you do not understand something. Tell a health care provider about: Any allergies you have. All medicines you are taking, including vitamins, herbs, eye drops, creams, and over-the-counter medicines. Any problems you or family members have had with anesthetic medicines. Any blood disorders you have. Any surgeries you have had. Any medical conditions you have. Whether you are pregnant or may be pregnant. What happens before the procedure? Visit your health care providers Keep all appointments before surgery. You will need to have a physical exam before surgery (preoperative exam) to make sure it is safe for you to have knee replacement surgery. You may also need to have more tests. When you go to the exam, bring a list of all the medicines and supplements, including herbs and vitamins, that you take. Have dental care and routine cleanings done before surgery. Germs from anywhere in your body, including your mouth, can travel to your new joint and infect it. Tell your dentist that you plan to have knee replacement surgery. Know the costs of surgery To find out how much the surgery will cost, call your insurance company as soon as you decide to have surgery. Ask questions like: How much of the surgery and hospital stay will be covered? What will be covered for: Medical equipment? Rehabilitation facilities? Home care? Prepare your home Pick a recovery spot that is not your bed. It is better that you sit more upright during recovery. You may want to use a recliner. Place items that you often use on a small table near your recovery spot. These may include the TV  remote, a cordless phone or your mobile phone, a book, a laptop computer, and a water glass. Move other items you will need to shelves and drawers that are at countertop height. Do this in your kitchen, bathroom, and bedroom. You may be given a walker to use at home. Check if you will have enough room to use a walker. Move around your home with your hands out about 6 inches (15 cm) from your sides. You will have enough room if you do not hit anything with your hands as you do this. Walk from: Your recovery spot to your kitchen and bathroom. Your bed to the bathroom. Prepare some meals to freeze and reheat later. Make your home safe for recovery   Remove all clutter and throw rugs from your floors. This will help you avoid tripping. Consider getting safety equipment that will be helpful during your recovery, such as: Grab bars in the shower and near the toilet. A raised toilet seat. This will help you get on and off the toilet more easily. A tub or shower bench. Prepare your body If you smoke, quit as soon as you can before surgery. If there is time, it is best to quit several months before surgery. Tell your surgeon if you use any products that contain nicotine or tobacco. These products include cigarettes, chewing tobacco, and vaping devices, such as e-cigarettes. These can delay healing. If you need help quitting, ask your health care provider. Talk to your health care provider about doing exercises before your surgery. Doing these exercises in the weeks before your surgery may help reduce pain and  improve function after surgery. Be sure to follow the exercise program given by your health care provider. Maintain a healthy diet. Do not change your diet before surgery unless your health care provider tells you to do that. Do not drink any alcohol for at least 48 hours before surgery. Plan your recovery In the first couple of weeks after surgery, it will be harder for you to do some of your  regular activities. You may get tired easily, and you will have limited movement in your leg. To make sure you have all the help you need after your surgery: Plan to have a responsible adult take you home from the hospital. Your health care provider will tell you how many days you can expect to be in the hospital. Cancel all work, caregiving, and volunteer responsibilities for at least 4-6 weeks after surgery. Plan to have a responsible adult stay with you day and night for the first week. This person should be someone you are comfortable with. You may need this person to help you with your exercises and personal care, such as bathing and using the toilet. If you live alone, arrange for someone to take care of your home and pets for the first 4-6 weeks after surgery. Arrange for drivers to take you to follow-up visits, the grocery store, and other places you may need to go for at least 4-6 weeks. Consider applying for a disability parking permit. To get an application, call your local department of motor vehicles [facility]) or your health care provider's office. Summary Getting prepared before knee replacement surgery can make your recovery easier and more comfortable. Keep all visits to your health care provider before surgery. You will have an exam and may have tests to make sure that you are ready for your surgery. Prepare your home and arrange for help at home. Plan to have a responsible adult take you home from the hospital. Also, plan to have a responsible adult stay with you day and night for the first week after you leave the hospital. This information is not intended to replace advice given to you by your health care provider. Make sure you discuss any questions you have with your health care provider. Document Revised: 11/28/2019 Document Reviewed: 11/28/2019 Elsevier Patient Education  [place].

## 2021-04-10 LAB — CBC WITH DIFFERENTIAL/PLATELET
Absolute Monocytes: 380 cells/uL (ref 200–950)
Basophils Absolute: 42 cells/uL (ref 0–200)
Basophils Relative: 0.8 %
Eosinophils Absolute: 88 cells/uL (ref 15–500)
Eosinophils Relative: 1.7 %
HCT: 47.9 % (ref 38.5–50.0)
Hemoglobin: 16.6 g/dL (ref 13.2–17.1)
Lymphs Abs: 2179 cells/uL (ref 850–3900)
MCH: 35 pg — ABNORMAL HIGH (ref 27.0–33.0)
MCHC: 34.7 g/dL (ref 32.0–36.0)
MCV: 101.1 fL — ABNORMAL HIGH (ref 80.0–100.0)
MPV: 9.5 fL (ref 7.5–12.5)
Monocytes Relative: 7.3 %
Neutro Abs: 2512 cells/uL (ref 1500–7800)
Neutrophils Relative %: 48.3 %
Platelets: 262 10*3/uL (ref 140–400)
RBC: 4.74 10*6/uL (ref 4.20–5.80)
RDW: 12 % (ref 11.0–15.0)
Total Lymphocyte: 41.9 %
WBC: 5.2 10*3/uL (ref 3.8–10.8)

## 2021-04-10 LAB — COMPLETE METABOLIC PANEL WITH GFR
AG Ratio: 1.7 (calc) (ref 1.0–2.5)
ALT: 27 U/L (ref 9–46)
AST: 22 U/L (ref 10–35)
Albumin: 4.2 g/dL (ref 3.6–5.1)
Alkaline phosphatase (APISO): 88 U/L (ref 35–144)
BUN: 12 mg/dL (ref 7–25)
CO2: 27 mmol/L (ref 20–32)
Calcium: 9.6 mg/dL (ref 8.6–10.3)
Chloride: 103 mmol/L (ref 98–110)
Creat: 1.09 mg/dL (ref 0.70–1.28)
Globulin: 2.5 g/dL (calc) (ref 1.9–3.7)
Glucose, Bld: 128 mg/dL — ABNORMAL HIGH (ref 65–99)
Potassium: 4.2 mmol/L (ref 3.5–5.3)
Sodium: 139 mmol/L (ref 135–146)
Total Bilirubin: 0.8 mg/dL (ref 0.2–1.2)
Total Protein: 6.7 g/dL (ref 6.1–8.1)
eGFR: 73 mL/min/{1.73_m2} (ref >=60)

## 2021-04-10 LAB — PROTIME-INR
INR: 1.1
Prothrombin Time: 10.8 s (ref 9.0–11.5)

## 2021-04-10 NOTE — Progress Notes (Signed)
============================================================ -   Test results slightly outside the reference range are not unusual. If there is anything important, I will review this with you,  otherwise it is considered normal test values.  If you have further questions,  please do not hesitate to contact me at the office or via My Chart.  ============================================================ ============================================================  -  CBC - Normal  Red cell count, WBC & Platelets  (clotting factors)  ============================================================   - CMET -  Non-fasting glucose - slightly elevated - is OK  &                                Liver , proteins, Kidneys - Electrolytes - >    all  Normal / OK ============================================================   - ProTime Clotting test is also Normal & OK   ============================================================   ============================================================

## 2021-04-15 NOTE — H&P (Signed)
TOTAL KNEE ADMISSION H&P  Patient is being admitted for right total knee arthroplasty.  Subjective:  Chief Complaint: Right knee pain.  HPI: Eric [name], 70 y.o. male has a history of pain and functional disability in the right knee due to arthritis and has failed non-surgical conservative treatments for greater than 12 weeks to include corticosteriod injections and activity modification. Onset of symptoms was gradual, starting  several  years ago with gradually worsening course since that time. The patient noted no past surgery on the right knee.  Patient currently rates pain in the right knee at 7 out of 10 with activity. Patient has night pain, worsening of pain with activity and weight bearing, pain with passive range of motion, and crepitus. Patient has evidence of  near bone-on-bone arthritis in the medial compartment of the right knee  by imaging studies. There is no active infection.  Patient Active Problem List   Diagnosis Date Noted   OA (osteoarthritis) of knee 11/25/2020   B12 deficiency 09/13/2020   History of basal cell cancer 06/13/2019   Chronic pain of both knees 06/13/2019   BPH with obstruction/lower urinary tract symptoms 01/04/2019   Atherosclerosis of aorta ([place]) by Chest CT scan in 06/2018 06/28/2018   Emphysema of lung ([place]) 06/28/2018   History of smoking greater than 50 pack years (quit 2006) 06/08/2018   Obesity (BMI 30.0-34.9) 06/07/2018   Medication management 10/11/2014   Major depression, recurrent, chronic ([place]) 04/06/2014   Labile hypertension    Abnormal glucose    Hyperlipidemia, mixed    Vitamin D deficiency     Past Medical History:  Diagnosis Date   Arteriosclerosis of aorta (HCC)    Arthritis    Basal cell carcinoma    GERD (gastroesophageal reflux disease)    Hyperlipidemia    Labile hypertension    pt denies    Other testicular hypofunction    Prediabetes    (A1c 5.8% / 2013)   Vitamin D deficiency     Past Surgical History:   Procedure Laterality Date   CATARACT EXTRACTION, BILATERAL Bilateral 2019   Dr. Mitzi Hansen [place]   dental implant surgery      KNEE ARTHROSCOPY Left [place] Left 11/25/2020   Procedure: TOTAL KNEE ARTHROPLASTY;  Surgeon: Gaynelle Arabian, MD;  Location: [facility];  Service: Orthopedics;  Laterality: Left;  62min    Prior to Admission medications   Medication Sig Start Date End Date Taking? Authorizing Provider  acetaminophen (TYLENOL) 500 MG tablet Take 1,000 mg by mouth every 6 (six) hours as needed for moderate pain.    [provider]  calcium carbonate (TUMS - DOSED IN MG ELEMENTAL CALCIUM) 500 MG chewable tablet Chew 1,000 mg by mouth daily as needed for indigestion or heartburn.    [provider]  citalopram (CELEXA) 40 MG tablet Take 1 tablet Daily for Mood 01/29/21   Unk Pinto, MD  rosuvastatin (CRESTOR) 5 MG tablet Take 1 tab three days / week (MWF) for cholesterol and plaque on arteries. 01/29/21   Unk Pinto, MD    Allergies  Allergen Reactions   Lipitor [Atorvastatin]     Cloudy headed    Lopid [Gemfibrozil]     Unknown reaction    Penicillins     Unknown childhood reaction  Tolerated Cephalosporin Date: 11/26/20.      Social History   Socioeconomic History   Marital status: Married    Spouse name: Not on file   Number of  children: Not on file   Years of education: Not on file   Highest education level: Not on file  Occupational History   Not on file  Tobacco Use   Smoking status: Former    Packs/day: 1.50    Years: 37.00    Pack years: 55.50    Types: Cigarettes    Start date: 06/08/1968    Quit date: 10/10/2004    Years since quitting: 16.5   Smokeless tobacco: Never  Vaping Use   Vaping Use: Never used  Substance and Sexual Activity   Alcohol use: Yes    Alcohol/week: 0.0 standard drinks    Comment: 5 drinks per week    Drug use: No   Sexual activity: Not on file  Other Topics Concern   Not on file   Social History Narrative   Not on file   Social Determinants of Health   Financial Resource Strain: Not on file  Food Insecurity: Not on file  Transportation Needs: Not on file  Physical Activity: Not on file  Stress: Not on file  Social Connections: Not on file  Intimate Partner Violence: Not on file    Tobacco Use: Medium Risk   Smoking Tobacco Use: Former   Smokeless Tobacco Use: Never   Passive Exposure: Not on file   Social History   Substance and Sexual Activity  Alcohol Use Yes   Alcohol/week: 0.0 standard drinks   Comment: 5 drinks per week     Family History  Problem Relation Age of Onset   Diabetes Mother    Alzheimer's disease Mother    Heart disease Father    Hypertension Father    Stroke Father    Colon polyps Father     Review of Systems  Constitutional:  Negative for chills and fever.  HENT:  Negative for congestion, sore throat and tinnitus.   Eyes:  Negative for double vision, photophobia and pain.  Respiratory:  Negative for cough, shortness of breath and wheezing.   Cardiovascular:  Negative for chest pain, palpitations and orthopnea.  Gastrointestinal:  Negative for heartburn, nausea and vomiting.  Genitourinary:  Negative for dysuria, frequency and urgency.  Musculoskeletal:  Positive for joint pain.  Neurological:  Negative for dizziness, weakness and headaches.   Objective:  Physical Exam: Well nourished and well developed.  General: Alert and oriented x3, cooperative and pleasant, no acute distress.  Head: normocephalic, atraumatic, neck supple.  Eyes: EOMI.  Respiratory: breath sounds clear in all fields, no wheezing, rales, or rhonchi. Cardiovascular: Regular rate and rhythm, no murmurs, gallops or rubs.  Abdomen: non-tender to palpation and soft, normoactive bowel sounds. Musculoskeletal:  Right Knee Exam:  No effusion.  Range of motion is 0-130 degrees.  No crepitus on range of motion of the knee.  Positive medial joint  line tenderness.  No lateral joint line tenderness.  Stable knee.   Calves soft and nontender. Motor function intact in LE. Strength 5/5 LE bilaterally. Neuro: Distal pulses 2+. Sensation to light touch intact in LE.  Imaging Review Plain radiographs demonstrate severe degenerative joint disease of the right knee. The overall alignment is neutral. The bone quality appears to be adequate for age and reported activity level.  Assessment/Plan:  End stage arthritis, right knee   The patient history, physical examination, clinical judgment of the provider and imaging studies are consistent with end stage degenerative joint disease of the right knee and total knee arthroplasty is deemed medically necessary. The treatment options including medical management, injection  therapy arthroscopy and arthroplasty were discussed at length. The risks and benefits of total knee arthroplasty were presented and reviewed. The risks due to aseptic loosening, infection, stiffness, patella tracking problems, thromboembolic complications and other imponderables were discussed. The patient acknowledged the explanation, agreed to proceed with the plan and consent was signed. Patient is being admitted for inpatient treatment for surgery, pain control, PT, OT, prophylactic antibiotics, VTE prophylaxis, progressive ambulation and ADLs and discharge planning. The patient is planning to be discharged  home .   Patient's anticipated LOS is less than 2 midnights, meeting these requirements: - Lives within 1 hour of care - Has a competent adult at home to recover with post-op recover - NO history of  - Chronic pain requiring opioids  - Diabetes  - Coronary Artery Disease  - Heart failure  - Heart attack  - Stroke  - DVT/VTE  - Cardiac arrhythmia  - Respiratory Failure/COPD  - Renal failure  - Anemia  - Advanced Liver disease  Therapy Plans: Outpatient therapy at [facility] Disposition: Home with wife Planned DVT  Prophylaxis: Xarelto 10 mg QD DME Needed: None PCP: Unk Pinto, MD (clearance received) TXA: IV Allergies: PCN (tolerated Ancef with left TKA) Anesthesia Concerns: None BMI: 29.2 Last HgbA1c: Not diabetic.  Pharmacy: [facility] ([place])  Other:  - Xarelto, oxycodone, methocarbamol with left TKA - Requests that we specify that foley be removed as soon as spinal wears off. Had extreme discomfort with left TKA  - Patient was instructed on what medications to stop prior to surgery. - Follow-up visit in 2 weeks with Dr. Wynelle Link - Begin physical therapy following surgery - Pre-operative lab work as pre-surgical testing - Prescriptions will be provided in hospital at time of discharge  Theresa Duty, PA-C Orthopedic Surgery [facility] [place]

## 2021-04-24 NOTE — Patient Instructions (Addendum)
DUE TO COVID-19 ONLY ONE VISITOR IS ALLOWED TO COME WITH YOU AND STAY IN THE WAITING ROOM ONLY DURING PRE OP AND PROCEDURE.   **NO VISITORS ARE ALLOWED IN THE SHORT STAY AREA OR RECOVERY ROOM!!**  IF YOU WILL BE ADMITTED INTO THE HOSPITAL YOU ARE ALLOWED ONLY TWO SUPPORT PEOPLE DURING VISITATION HOURS ONLY (7 AM -8PM)    Up to two visitors ages 57+ are allowed at one time in a patient's room.  The visitors may rotate out with other people throughout the day.  Additionally, up to two children between the ages of 16 and 38 are allowed and do not count toward the number of allowed visitors.  Children within this age range must be accompanied by an adult visitor.  One adult visitor may remain with the patient overnight and must be in the room by 8 PM.  COVID SWAB TESTING MUST BE COMPLETED ON:  Thursday, 05-01-21,  Between the hours of 8 and 3  **MUST PRESENT COMPLETED FORM AT TESTING SITE**    [facility] [address] [place] (backside of the building)  You are not required to quarantine, however you are required to wear a well-fitted mask when you are out and around people not in your household.  Hand Hygiene often Do NOT share personal items Notify your provider if you are in close contact with someone who has COVID or you develop fever 100.4 or greater, new onset of sneezing, cough, sore throat, shortness of breath or body aches.       Your procedure is scheduled on: Monday, 05-05-21   Report to [facility] Main  Entrance     Report to admitting at  6:40 AM   Call this number if you have problems the morning of surgery [phone]   Do not eat food :After Midnight.   May have liquids until 6:20 AM day of surgery  CLEAR LIQUID DIET  Foods Allowed                                                                     Foods Excluded  Water, Black Coffee (no milk/no creamer) and tea, regular and decaf                              liquids that you cannot  Plain Jell-O in any  flavor  (No red)                         see through such as: Fruit ices (not with fruit pulp)                                 milk, soups, orange juice  Iced Popsicles (No red)                                    All solid food                             Apple juices  Sports drinks like Gatorade (No red) Lightly seasoned clear broth or consume(fat free) Sugar     Complete one G2 drink the morning of surgery at 6:20 AM the day of surgery.     The day of surgery:  Drink ONE (1) Pre-Surgery G2 by am the morning of surgery. Drink in one sitting. Do not sip.  This drink was given to you during your hospital  pre-op appointment visit. Nothing else to drink after completing the Pre-Surgery G2.          If you have questions, please contact your surgeon's office.     Oral Hygiene is also important to reduce your risk of infection.                                    Remember - BRUSH YOUR TEETH THE MORNING OF SURGERY WITH YOUR REGULAR TOOTHPASTE   Do NOT smoke after Midnight   Take these medicines the morning of surgery with A SIP OF WATER: Tylenol, Citalopram, Rosuvastatin   Stop all vitamins and herbal supplements a week before surgery             You may not have any metal on your body including jewelry, and body piercing             Do not wear lotions, powders, cologne, or deodorant              Men may shave face and neck.  Do not bring valuables to the hospital. [place].   Contacts, dentures or bridgework may not be worn into surgery.   Bring small overnight bag day of surgery.  Special Instructions: Bring a copy of your healthcare power of attorney and living will documents the day of surgery if you haven't scanned them in before.  Please read over the following fact sheets you were given: IF YOU HAVE QUESTIONS ABOUT YOUR PRE OP INSTRUCTIONS PLEASE CALL [place] - Preparing for Surgery Before surgery, you can play  an important role.  Because skin is not sterile, your skin needs to be as free of germs as possible.  You can reduce the number of germs on your skin by washing with CHG (chlorahexidine gluconate) soap before surgery.  CHG is an antiseptic cleaner which kills germs and bonds with the skin to continue killing germs even after washing. Please DO NOT use if you have an allergy to CHG or antibacterial soaps.  If your skin becomes reddened/irritated stop using the CHG and inform your nurse when you arrive at Short Stay. Do not shave (including legs and underarms) for at least 48 hours prior to the first CHG shower.  You may shave your face/neck.  Please follow these instructions carefully:  1.  Shower with CHG Soap the night before surgery and the  morning of surgery.  2.  If you choose to wash your hair, wash your hair first as usual with your normal  shampoo.  3.  After you shampoo, rinse your hair and body thoroughly to remove the shampoo.                             4.  Use CHG as you would any other liquid soap.  You can apply chg directly to the skin and wash.  Gently with a scrungie or  clean washcloth.  5.  Apply the CHG Soap to your body ONLY FROM THE NECK DOWN.   Do   not use on face/ open                           Wound or open sores. Avoid contact with eyes, ears mouth and   genitals (private parts).                       Wash face,  Genitals (private parts) with your normal soap.             6.  Wash thoroughly, paying special attention to the area where your    surgery  will be performed.  7.  Thoroughly rinse your body with warm water from the neck down.  8.  DO NOT shower/wash with your normal soap after using and rinsing off the CHG Soap.                9.  Pat yourself dry with a clean towel.            10.  Wear clean pajamas.            11.  Place clean sheets on your bed the night of your first shower and do not  sleep with pets. Day of Surgery : Do not apply any lotions/deodorants  the morning of surgery.  Please wear clean clothes to the hospital/surgery center.  FAILURE TO FOLLOW THESE INSTRUCTIONS MAY RESULT IN THE CANCELLATION OF YOUR SURGERY  PATIENT SIGNATURE_________________________________  NURSE SIGNATURE__________________________________  ________________________________________________________________________      Eric [name]  An incentive spirometer is a tool that can help keep your lungs clear and active. This tool measures how well you are filling your lungs with each breath. Taking long deep breaths may help reverse or decrease the chance of developing breathing (pulmonary) problems (especially infection) following: A long period of time when you are unable to move or be active. BEFORE THE PROCEDURE  If the spirometer includes an indicator to show your best effort, your nurse or respiratory therapist will set it to a desired goal. If possible, sit up straight or lean slightly forward. Try not to slouch. Hold the incentive spirometer in an upright position. INSTRUCTIONS FOR USE  Sit on the edge of your bed if possible, or sit up as far as you can in bed or on a chair. Hold the incentive spirometer in an upright position. Breathe out normally. Place the mouthpiece in your mouth and seal your lips tightly around it. Breathe in slowly and as deeply as possible, raising the piston or the ball toward the top of the column. Hold your breath for 3-5 seconds or for as long as possible. Allow the piston or ball to fall to the bottom of the column. Remove the mouthpiece from your mouth and breathe out normally. Rest for a few seconds and repeat Steps 1 through 7 at least 10 times every 1-2 hours when you are awake. Take your time and take a few normal breaths between deep breaths. The spirometer may include an indicator to show your best effort. Use the indicator as a goal to work toward during each repetition. After each set of 10 deep breaths,  practice coughing to be sure your lungs are clear. If you have an incision (the cut made at the time of surgery), support your incision when coughing by placing a  pillow or rolled up towels firmly against it. Once you are able to get out of bed, walk around indoors and cough well. You may stop using the incentive spirometer when instructed by your caregiver.  RISKS AND COMPLICATIONS Take your time so you do not get dizzy or light-headed. If you are in pain, you may need to take or ask for pain medication before doing incentive spirometry. It is harder to take a deep breath if you are having pain. AFTER USE Rest and breathe slowly and easily. It can be helpful to keep track of a log of your progress. Your caregiver can provide you with a simple table to help with this. If you are using the spirometer at home, follow these instructions: [place] IF:  You are having difficultly using the spirometer. You have trouble using the spirometer as often as instructed. Your pain medication is not giving enough relief while using the spirometer. You develop fever of 100.5 F (38.1 C) or higher. SEEK IMMEDIATE MEDICAL CARE IF:  You cough up bloody sputum that had not been present before. You develop fever of 102 F (38.9 C) or greater. You develop worsening pain at or near the incision site. MAKE SURE YOU:  Understand these instructions. Will watch your condition. Will get help right away if you are not doing well or get worse. Document Released: 10/19/2006 Document Revised: 08/31/2011 Document Reviewed: 12/20/2006 [facility] Patient Information 2014 [place], [place].   ________________________________________________________________________

## 2021-04-24 NOTE — Progress Notes (Addendum)
COVID swab appointment:  05-01-21  COVID Vaccine Completed: Yes x2 08-04-19 09-01-19 Date COVID Vaccine completed: Has received booster: Yes x2 04-16-20 COVID vaccine manufacturer: Moderna     Date of COVID positive in last 90 days:  No  PCP - Unk Pinto, MD Cardiologist - N/A  Medical clearance on chart dated 04-09-21 by Dr. Melford Aase  Chest x-ray - 09-12-20 Epic EKG - 01-29-21 Epic Stress Test - N/A ECHO - N/A Cardiac Cath - N.A Pacemaker/ICD device last checked: Spinal Cord Stimulator:  Sleep Study - No CPAP -   Fasting Blood Sugar - N/A Checks Blood Sugar _____ times a day  Blood Thinner Instructions:N/A Aspirin Instructions: Last Dose:  Activity level:  Can go up a flight of stairs and perform activities of daily living without stopping and without symptoms of chest pain or shortness of breath.    Anesthesia review:  N/A  Patient denies shortness of breath, fever, cough and chest pain at PAT appointment   Patient verbalized understanding of instructions that were given to them at the PAT appointment. Patient was also instructed that they will need to review over the PAT instructions again at home before surgery.

## 2021-04-28 ENCOUNTER — Encounter: Payer: Self-pay | Admitting: Internal Medicine

## 2021-04-30 ENCOUNTER — Encounter (HOSPITAL_COMMUNITY): Payer: Self-pay

## 2021-04-30 ENCOUNTER — Encounter (HOSPITAL_COMMUNITY)
Admission: RE | Admit: 2021-04-30 | Discharge: 2021-04-30 | Disposition: A | Discharge status: Home / Self Care | Payer: PPO | Source: Ambulatory Visit | Attending: Orthopedic Surgery | Admitting: Orthopedic Surgery

## 2021-04-30 ENCOUNTER — Other Ambulatory Visit: Payer: Self-pay

## 2021-04-30 VITALS — BP 115/85 | HR 74 | Temp 98.0°F | Resp 16 | Ht 72.0 in | Wt 213.0 lb

## 2021-04-30 DIAGNOSIS — Z01818 Encounter for other preprocedural examination: Secondary | ICD-10-CM

## 2021-04-30 DIAGNOSIS — R7303 Prediabetes: Secondary | ICD-10-CM | POA: Diagnosis not present

## 2021-04-30 DIAGNOSIS — Z01812 Encounter for preprocedural laboratory examination: Secondary | ICD-10-CM | POA: Diagnosis not present

## 2021-04-30 LAB — SURGICAL PCR SCREEN
MRSA, PCR: NEGATIVE
Staphylococcus aureus: NEGATIVE

## 2021-04-30 LAB — HEMOGLOBIN A1C
Hgb A1c MFr Bld: 5.3 % (ref 4.8–5.6)
Mean Plasma Glucose: 105.41 mg/dL

## 2021-04-30 LAB — GLUCOSE, CAPILLARY: Glucose-Capillary: 100 mg/dL — ABNORMAL HIGH (ref 70–99)

## 2021-05-01 ENCOUNTER — Other Ambulatory Visit: Payer: Self-pay | Admitting: Orthopedic Surgery

## 2021-05-01 LAB — SARS CORONAVIRUS 2 (TAT 6-24 HRS): SARS Coronavirus 2: NEGATIVE

## 2021-05-05 ENCOUNTER — Ambulatory Visit (HOSPITAL_COMMUNITY): Payer: PPO | Admitting: Certified Registered"

## 2021-05-05 ENCOUNTER — Encounter (HOSPITAL_COMMUNITY)
Admission: RE | Disposition: A | Discharge status: Home / Self Care | Payer: Self-pay | Source: Home / Self Care | Attending: Orthopedic Surgery

## 2021-05-05 ENCOUNTER — Encounter (HOSPITAL_COMMUNITY): Payer: Self-pay | Admitting: Orthopedic Surgery

## 2021-05-05 ENCOUNTER — Other Ambulatory Visit: Payer: Self-pay

## 2021-05-05 ENCOUNTER — Observation Stay (HOSPITAL_COMMUNITY)
Admission: RE | Admit: 2021-05-05 | Discharge: 2021-05-06 | Disposition: A | Discharge status: Home / Self Care | Payer: PPO | Attending: Orthopedic Surgery | Admitting: Orthopedic Surgery

## 2021-05-05 DIAGNOSIS — G8918 Other acute postprocedural pain: Secondary | ICD-10-CM | POA: Diagnosis not present

## 2021-05-05 DIAGNOSIS — M1611 Unilateral primary osteoarthritis, right hip: Secondary | ICD-10-CM | POA: Diagnosis not present

## 2021-05-05 DIAGNOSIS — Z87891 Personal history of nicotine dependence: Secondary | ICD-10-CM | POA: Diagnosis not present

## 2021-05-05 DIAGNOSIS — R7303 Prediabetes: Secondary | ICD-10-CM | POA: Insufficient documentation

## 2021-05-05 DIAGNOSIS — E559 Vitamin D deficiency, unspecified: Secondary | ICD-10-CM | POA: Diagnosis not present

## 2021-05-05 DIAGNOSIS — Z79899 Other long term (current) drug therapy: Secondary | ICD-10-CM | POA: Insufficient documentation

## 2021-05-05 DIAGNOSIS — M1711 Unilateral primary osteoarthritis, right knee: Secondary | ICD-10-CM | POA: Diagnosis not present

## 2021-05-05 DIAGNOSIS — Z96652 Presence of left artificial knee joint: Secondary | ICD-10-CM | POA: Insufficient documentation

## 2021-05-05 DIAGNOSIS — E785 Hyperlipidemia, unspecified: Secondary | ICD-10-CM | POA: Diagnosis not present

## 2021-05-05 DIAGNOSIS — Z85828 Personal history of other malignant neoplasm of skin: Secondary | ICD-10-CM | POA: Diagnosis not present

## 2021-05-05 HISTORY — PX: TOTAL KNEE ARTHROPLASTY: SHX125

## 2021-05-05 LAB — GLUCOSE, CAPILLARY: Glucose-Capillary: 119 mg/dL — ABNORMAL HIGH (ref 70–99)

## 2021-05-05 SURGERY — ARTHROPLASTY, KNEE, TOTAL
Anesthesia: Spinal | Site: Knee | Laterality: Right

## 2021-05-05 MED ORDER — FENTANYL CITRATE PF 50 MCG/ML IJ SOSY
50.0000 ug | PREFILLED_SYRINGE | INTRAMUSCULAR | Status: DC
  Administered 2021-05-05: 50 ug via INTRAVENOUS

## 2021-05-05 MED ORDER — PROPOFOL 10 MG/ML IV BOLUS
INTRAVENOUS | Status: AC
  Filled 2021-05-05: qty 20

## 2021-05-05 MED ORDER — MORPHINE SULFATE (PF) 2 MG/ML IV SOLN: 0.5000 mg | INTRAVENOUS | Status: DC | PRN

## 2021-05-05 MED ORDER — METOCLOPRAMIDE HCL 5 MG PO TABS: 5.0000 mg | ORAL_TABLET | Freq: Three times a day (TID) | ORAL | Status: DC | PRN

## 2021-05-05 MED ORDER — POVIDONE-IODINE 10 % EX SWAB
2.0000 "application " | Freq: Once | CUTANEOUS | Status: AC
  Administered 2021-05-05: 2 via TOPICAL

## 2021-05-05 MED ORDER — BUPIVACAINE IN DEXTROSE 0.75-8.25 % IT SOLN
INTRATHECAL | Status: DC | PRN
  Administered 2021-05-05: 1.6 mL via INTRATHECAL

## 2021-05-05 MED ORDER — SODIUM CHLORIDE 0.9 % IR SOLN
Status: DC | PRN
  Administered 2021-05-05 (×2): 1000 mL

## 2021-05-05 MED ORDER — CHLORHEXIDINE GLUCONATE 0.12 % MT SOLN
15.0000 mL | Freq: Once | OROMUCOSAL | Status: AC
  Administered 2021-05-05: 15 mL via OROMUCOSAL

## 2021-05-05 MED ORDER — PHENOL 1.4 % MT LIQD: 1.0000 | OROMUCOSAL | Status: DC | PRN

## 2021-05-05 MED ORDER — ACETAMINOPHEN 500 MG PO TABS
1000.0000 mg | ORAL_TABLET | Freq: Four times a day (QID) | ORAL | Status: AC
  Administered 2021-05-05 – 2021-05-06 (×2): 1000 mg via ORAL
  Filled 2021-05-05 (×2): qty 2

## 2021-05-05 MED ORDER — BUPIVACAINE LIPOSOME 1.3 % IJ SUSP
INTRAMUSCULAR | Status: AC
  Filled 2021-05-05: qty 20

## 2021-05-05 MED ORDER — BISACODYL 10 MG RE SUPP: 10.0000 mg | Freq: Every day | RECTAL | Status: DC | PRN

## 2021-05-05 MED ORDER — DEXAMETHASONE SODIUM PHOSPHATE 10 MG/ML IJ SOLN
10.0000 mg | Freq: Once | INTRAMUSCULAR | Status: AC
  Administered 2021-05-06: 10 mg via INTRAVENOUS
  Filled 2021-05-05: qty 1

## 2021-05-05 MED ORDER — ORAL CARE MOUTH RINSE: 15.0000 mL | Freq: Once | OROMUCOSAL | Status: AC

## 2021-05-05 MED ORDER — HYDROMORPHONE HCL 1 MG/ML IJ SOLN: 0.2500 mg | INTRAMUSCULAR | Status: DC | PRN

## 2021-05-05 MED ORDER — DOCUSATE SODIUM 100 MG PO CAPS
100.0000 mg | ORAL_CAPSULE | Freq: Two times a day (BID) | ORAL | Status: DC
  Administered 2021-05-05 – 2021-05-06 (×2): 100 mg via ORAL
  Filled 2021-05-05 (×2): qty 1

## 2021-05-05 MED ORDER — PROPOFOL 1000 MG/100ML IV EMUL
INTRAVENOUS | Status: AC
  Filled 2021-05-05: qty 100

## 2021-05-05 MED ORDER — OXYCODONE HCL 5 MG PO TABS
10.0000 mg | ORAL_TABLET | ORAL | Status: DC | PRN
  Administered 2021-05-05 – 2021-05-06 (×5): 10 mg via ORAL
  Filled 2021-05-05 (×5): qty 2

## 2021-05-05 MED ORDER — BUPIVACAINE LIPOSOME 1.3 % IJ SUSP
INTRAMUSCULAR | Status: DC | PRN
  Administered 2021-05-05: 20 mL

## 2021-05-05 MED ORDER — CEFAZOLIN SODIUM-DEXTROSE 2-4 GM/100ML-% IV SOLN
2.0000 g | Freq: Four times a day (QID) | INTRAVENOUS | Status: AC
  Administered 2021-05-05 (×2): 2 g via INTRAVENOUS
  Filled 2021-05-05 (×2): qty 100

## 2021-05-05 MED ORDER — TRANEXAMIC ACID-NACL 1000-0.7 MG/100ML-% IV SOLN
1000.0000 mg | INTRAVENOUS | Status: AC
  Administered 2021-05-05: 1000 mg via INTRAVENOUS
  Filled 2021-05-05: qty 100

## 2021-05-05 MED ORDER — ACETAMINOPHEN 10 MG/ML IV SOLN: 1000.0000 mg | Freq: Once | INTRAVENOUS | Status: DC | PRN

## 2021-05-05 MED ORDER — PROPOFOL 500 MG/50ML IV EMUL
INTRAVENOUS | Status: DC | PRN
  Administered 2021-05-05: 110 ug/kg/min via INTRAVENOUS

## 2021-05-05 MED ORDER — LACTATED RINGERS IV SOLN: INTRAVENOUS | Status: DC

## 2021-05-05 MED ORDER — BUPIVACAINE LIPOSOME 1.3 % IJ SUSP: 20.0000 mL | Freq: Once | INTRAMUSCULAR | Status: DC

## 2021-05-05 MED ORDER — CALCIUM CARBONATE ANTACID 500 MG PO CHEW: 1000.0000 mg | CHEWABLE_TABLET | Freq: Every day | ORAL | Status: DC | PRN

## 2021-05-05 MED ORDER — CEFAZOLIN SODIUM-DEXTROSE 2-4 GM/100ML-% IV SOLN
2.0000 g | INTRAVENOUS | Status: AC
  Administered 2021-05-05: 2 g via INTRAVENOUS
  Filled 2021-05-05: qty 100

## 2021-05-05 MED ORDER — MIDAZOLAM HCL 2 MG/2ML IJ SOLN
1.0000 mg | INTRAMUSCULAR | Status: DC
  Administered 2021-05-05: 1 mg via INTRAVENOUS

## 2021-05-05 MED ORDER — FLEET ENEMA 7-19 GM/118ML RE ENEM: 1.0000 | ENEMA | Freq: Once | RECTAL | Status: DC | PRN

## 2021-05-05 MED ORDER — DIPHENHYDRAMINE HCL 12.5 MG/5ML PO ELIX: 12.5000 mg | ORAL_SOLUTION | ORAL | Status: DC | PRN

## 2021-05-05 MED ORDER — DEXAMETHASONE SODIUM PHOSPHATE 10 MG/ML IJ SOLN
8.0000 mg | Freq: Once | INTRAMUSCULAR | Status: AC
  Administered 2021-05-05: 8 mg via INTRAVENOUS

## 2021-05-05 MED ORDER — ONDANSETRON HCL 4 MG/2ML IJ SOLN: 4.0000 mg | Freq: Four times a day (QID) | INTRAMUSCULAR | Status: DC | PRN

## 2021-05-05 MED ORDER — ONDANSETRON HCL 4 MG PO TABS: 4.0000 mg | ORAL_TABLET | Freq: Four times a day (QID) | ORAL | Status: DC | PRN

## 2021-05-05 MED ORDER — ROPIVACAINE HCL 5 MG/ML IJ SOLN
INTRAMUSCULAR | Status: DC | PRN
  Administered 2021-05-05: 30 mL via PERINEURAL

## 2021-05-05 MED ORDER — METHOCARBAMOL 500 MG IVPB - SIMPLE MED
500.0000 mg | Freq: Four times a day (QID) | INTRAVENOUS | Status: DC | PRN
  Filled 2021-05-05: qty 50

## 2021-05-05 MED ORDER — ONDANSETRON HCL 4 MG/2ML IJ SOLN: 4.0000 mg | Freq: Once | INTRAMUSCULAR | Status: DC | PRN

## 2021-05-05 MED ORDER — MENTHOL 3 MG MT LOZG: 1.0000 | LOZENGE | OROMUCOSAL | Status: DC | PRN

## 2021-05-05 MED ORDER — ONDANSETRON HCL 4 MG/2ML IJ SOLN
INTRAMUSCULAR | Status: DC | PRN
  Administered 2021-05-05: 4 mg via INTRAVENOUS

## 2021-05-05 MED ORDER — SODIUM CHLORIDE (PF) 0.9 % IJ SOLN
INTRAMUSCULAR | Status: AC
  Filled 2021-05-05: qty 10

## 2021-05-05 MED ORDER — OXYCODONE HCL 5 MG PO TABS: 5.0000 mg | ORAL_TABLET | ORAL | Status: DC | PRN

## 2021-05-05 MED ORDER — SODIUM CHLORIDE (PF) 0.9 % IJ SOLN
INTRAMUSCULAR | Status: DC | PRN
  Administered 2021-05-05: 60 mL

## 2021-05-05 MED ORDER — DEXAMETHASONE SODIUM PHOSPHATE 10 MG/ML IJ SOLN
INTRAMUSCULAR | Status: AC
  Filled 2021-05-05: qty 1

## 2021-05-05 MED ORDER — POLYETHYLENE GLYCOL 3350 17 G PO PACK: 17.0000 g | PACK | Freq: Every day | ORAL | Status: DC | PRN

## 2021-05-05 MED ORDER — RIVAROXABAN 10 MG PO TABS
10.0000 mg | ORAL_TABLET | Freq: Every day | ORAL | Status: DC
  Administered 2021-05-06: 10 mg via ORAL
  Filled 2021-05-05: qty 1

## 2021-05-05 MED ORDER — METHOCARBAMOL 500 MG PO TABS
500.0000 mg | ORAL_TABLET | Freq: Four times a day (QID) | ORAL | Status: DC | PRN
  Administered 2021-05-05 – 2021-05-06 (×2): 500 mg via ORAL
  Filled 2021-05-05 (×2): qty 1

## 2021-05-05 MED ORDER — GABAPENTIN 300 MG PO CAPS
300.0000 mg | ORAL_CAPSULE | Freq: Three times a day (TID) | ORAL | Status: DC
  Administered 2021-05-05 – 2021-05-06 (×3): 300 mg via ORAL
  Filled 2021-05-05 (×3): qty 1

## 2021-05-05 MED ORDER — MIDAZOLAM HCL 2 MG/2ML IJ SOLN
INTRAMUSCULAR | Status: AC
  Filled 2021-05-05: qty 2

## 2021-05-05 MED ORDER — METOCLOPRAMIDE HCL 5 MG/ML IJ SOLN: 5.0000 mg | Freq: Three times a day (TID) | INTRAMUSCULAR | Status: DC | PRN

## 2021-05-05 MED ORDER — CITALOPRAM HYDROBROMIDE 20 MG PO TABS
40.0000 mg | ORAL_TABLET | Freq: Every day | ORAL | Status: DC
  Administered 2021-05-06: 40 mg via ORAL
  Filled 2021-05-05: qty 2

## 2021-05-05 MED ORDER — ACETAMINOPHEN 10 MG/ML IV SOLN
1000.0000 mg | Freq: Four times a day (QID) | INTRAVENOUS | Status: DC
Start: 2021-05-05 — End: 2021-05-05
  Administered 2021-05-05: 1000 mg via INTRAVENOUS
  Filled 2021-05-05: qty 100

## 2021-05-05 MED ORDER — FENTANYL CITRATE PF 50 MCG/ML IJ SOSY
PREFILLED_SYRINGE | INTRAMUSCULAR | Status: AC
  Filled 2021-05-05: qty 2

## 2021-05-05 MED ORDER — ONDANSETRON HCL 4 MG/2ML IJ SOLN
INTRAMUSCULAR | Status: AC
  Filled 2021-05-05: qty 2

## 2021-05-05 MED ORDER — SODIUM CHLORIDE 0.9 % IV SOLN: INTRAVENOUS | Status: DC

## 2021-05-05 SURGICAL SUPPLY — 51 items
ATTUNE MED DOME PAT 41 KNEE (Knees) ×2 IMPLANT
ATTUNE PS FEM RT SZ 7 CEM KNEE (Femur) ×2 IMPLANT
ATTUNE PSRP INSR SZ7 7 KNEE (Insert) ×2 IMPLANT
BAG COUNTER SPONGE SURGICOUNT (BAG) IMPLANT
BAG ZIPLOCK 12X15 (MISCELLANEOUS) ×2 IMPLANT
BASE TIBIAL ROT PLAT SZ 7 KNEE (Knees) ×1 IMPLANT
BLADE SAG 18X100X1.27 (BLADE) ×2 IMPLANT
BLADE SAW SGTL 11.0X1.19X90.0M (BLADE) ×2 IMPLANT
BNDG ELASTIC 6X5.8 VLCR STR LF (GAUZE/BANDAGES/DRESSINGS) ×2 IMPLANT
BOWL SMART MIX CTS (DISPOSABLE) ×2 IMPLANT
CEMENT HV SMART SET (Cement) ×4 IMPLANT
COVER SURGICAL LIGHT HANDLE (MISCELLANEOUS) ×2 IMPLANT
CUFF TOURN SGL QUICK 34 (TOURNIQUET CUFF) ×2
CUFF TRNQT CYL 34X4.125X (TOURNIQUET CUFF) ×1 IMPLANT
DECANTER SPIKE VIAL GLASS SM (MISCELLANEOUS) ×2 IMPLANT
DRAPE INCISE IOBAN 66X45 STRL (DRAPES) ×2 IMPLANT
DRAPE U-SHAPE 47X51 STRL (DRAPES) ×2 IMPLANT
DRSG AQUACEL AG ADV 3.5X10 (GAUZE/BANDAGES/DRESSINGS) ×2 IMPLANT
DURAPREP 26ML APPLICATOR (WOUND CARE) ×2 IMPLANT
ELECT REM PT RETURN 15FT ADLT (MISCELLANEOUS) ×2 IMPLANT
GLOVE SRG 8 PF TXTR STRL LF DI (GLOVE) ×1 IMPLANT
GLOVE SURG ENC MOIS LTX SZ6.5 (GLOVE) ×2 IMPLANT
GLOVE SURG ENC MOIS LTX SZ8 (GLOVE) ×4 IMPLANT
GLOVE SURG UNDER POLY LF SZ7 (GLOVE) ×2 IMPLANT
GLOVE SURG UNDER POLY LF SZ8 (GLOVE) ×2
GLOVE SURG UNDER POLY LF SZ8.5 (GLOVE) ×2 IMPLANT
GOWN STRL REUS W/TWL LRG LVL3 (GOWN DISPOSABLE) ×4 IMPLANT
GOWN STRL REUS W/TWL XL LVL3 (GOWN DISPOSABLE) ×2 IMPLANT
HANDPIECE INTERPULSE COAX TIP (DISPOSABLE) ×1
HOLDER FOLEY CATH W/STRAP (MISCELLANEOUS) ×2 IMPLANT
IMMOBILIZER KNEE 20 (SOFTGOODS) ×2
IMMOBILIZER KNEE 20 THIGH 36 (SOFTGOODS) ×1 IMPLANT
KIT TURNOVER KIT A (KITS) IMPLANT
MANIFOLD NEPTUNE II (INSTRUMENTS) ×2 IMPLANT
NS IRRIG 1000ML POUR BTL (IV SOLUTION) ×2 IMPLANT
PACK TOTAL KNEE CUSTOM (KITS) ×2 IMPLANT
PADDING CAST COTTON 6X4 STRL (CAST SUPPLIES) ×2 IMPLANT
PROTECTOR NERVE ULNAR (MISCELLANEOUS) ×2 IMPLANT
SET HNDPC FAN SPRY TIP SCT (DISPOSABLE) ×1 IMPLANT
SPONGE T-LAP 18X18 ~~LOC~~+RFID (SPONGE) ×4 IMPLANT
STRIP CLOSURE SKIN 1/2X4 (GAUZE/BANDAGES/DRESSINGS) ×4 IMPLANT
SUT MNCRL AB 4-0 PS2 18 (SUTURE) ×2 IMPLANT
SUT STRATAFIX 0 PDS 27 VIOLET (SUTURE) ×2
SUT VIC AB 2-0 CT1 27 (SUTURE) ×3
SUT VIC AB 2-0 CT1 TAPERPNT 27 (SUTURE) ×3 IMPLANT
SUTURE STRATFX 0 PDS 27 VIOLET (SUTURE) ×1 IMPLANT
TIBIAL BASE ROT PLAT SZ 7 KNEE (Knees) ×2 IMPLANT
TRAY FOLEY MTR SLVR 16FR STAT (SET/KITS/TRAYS/PACK) ×2 IMPLANT
TUBE SUCTION HIGH CAP CLEAR NV (SUCTIONS) ×2 IMPLANT
WATER STERILE IRR 1000ML POUR (IV SOLUTION) ×4 IMPLANT
WRAP KNEE MAXI GEL POST OP (GAUZE/BANDAGES/DRESSINGS) ×2 IMPLANT

## 2021-05-05 NOTE — Evaluation (Signed)
Physical Therapy Evaluation Patient Details Name: Eric [name] MRN: [id number] DOB: 11-05-1950 Today's Date: 05/05/2021  History of Present Illness  70 yo male s/p R TKA. PMH: L TKA on 11/25/2020, labile HTN, OA  Clinical Impression  Pt is s/p TKA resulting in the deficits listed below (see PT Problem List).  Pt amb ~ 150' with RW and min-guard assist. Anticipate steady progress in acute setting.  Pt will benefit from skilled PT to increase their independence and safety with mobility to allow discharge to the venue listed below.         Recommendations for follow up therapy are one component of a multi-disciplinary discharge planning process, led by the attending physician.  Recommendations may be updated based on patient status, additional functional criteria and insurance authorization.  Follow Up Recommendations Follow physician's recommendations for discharge plan and follow up therapies    Assistance Recommended at Discharge Set up Supervision/Assistance  Functional Status Assessment Patient has had a recent decline in their functional status and demonstrates the ability to make significant improvements in function in a reasonable and predictable amount of time.  Equipment Recommendations  None recommended by PT    Recommendations for Other Services       Precautions / Restrictions Precautions Precautions: Fall;Knee Required Braces or Orthoses: Knee Immobilizer - Right Knee Immobilizer - Right: Discontinue once straight leg raise with < 10 degree lag Restrictions Weight Bearing Restrictions: No RLE Weight Bearing: Weight bearing as tolerated      Mobility  Bed Mobility Overal bed mobility: Needs Assistance Bed Mobility: Supine to Sit     Supine to sit: Supervision     General bed mobility comments: for safety    Transfers Overall transfer level: Needs assistance Equipment used: Rolling walker (2 wheels) Transfers: Sit to/from Stand Sit to Stand:  Supervision;Min guard           General transfer comment: cues for hand placement, supervision for safety    Ambulation/Gait Ambulation/Gait assistance: Supervision;Min guard Gait Distance (Feet): 260 Feet Assistive device: Rolling walker (2 wheels) Gait Pattern/deviations: Step-to pattern;Decreased stance time - right       General Gait Details: cues for  sequence and RW position. pt amb ~ 10' x2 while carrying RW, encouraged pt to keep RW in contact with floor for safety  Stairs            Wheelchair Mobility    Modified Rankin (Stroke Patients Only)       Balance                                             Pertinent Vitals/Pain Pain Assessment: 0-10 Pain Score: 4  Pain Location: right knee Pain Descriptors / Indicators: Grimacing;Sore Pain Intervention(s): Limited activity within patient's tolerance;Monitored during session;Premedicated before session;Repositioned    Home Living Family/patient expects to be discharged to:: Private residence Living Arrangements: Spouse/significant other Available Help at Discharge: Family;Available 24 hours/day Type of Home: House Home Access: Stairs to enter   Entrance Stairs-Number of Steps: 2 Alternate Level Stairs-Number of Steps: 14 Home Layout: Two level;1/2 bath on main level;Bed/bath upstairs Home Equipment: Rolling Walker (2 wheels);Cane - single point;Shower seat      Prior Function Prior Level of Function : Independent/Modified Independent                     Hand Dominance  Extremity/Trunk Assessment   Upper Extremity Assessment Upper Extremity Assessment: Overall WFL for tasks assessed    Lower Extremity Assessment Lower Extremity Assessment: RLE deficits/detail RLE Deficits / Details: ankle WFL, knee extension and hip flexion grossly 3/5, knee flexion to ~80 degrees       Communication   Communication: No difficulties  Cognition Arousal/Alertness:  Awake/alert Behavior During Therapy: WFL for tasks assessed/performed Overall Cognitive Status: Within Functional Limits for tasks assessed                                          General Comments      Exercises Total Joint Exercises Ankle Circles/Pumps: 5 reps   Assessment/Plan    PT Assessment    PT Problem List         PT Treatment Interventions      PT Goals (Current goals can be found in the Care Plan section)  Acute Rehab PT Goals Patient Stated Goal: back to independence PT Goal Formulation: With patient Time For Goal Achievement: 05/12/21 Potential to Achieve Goals: Good    Frequency     Barriers to discharge        Co-evaluation               AM-PAC PT "6 Clicks" Mobility  Outcome Measure Help needed turning from your back to your side while in a flat bed without using bedrails?: A Little Help needed moving from lying on your back to sitting on the side of a flat bed without using bedrails?: A Little Help needed moving to and from a bed to a chair (including a wheelchair)?: A Little Help needed standing up from a chair using your arms (e.g., wheelchair or bedside chair)?: A Little Help needed to walk in hospital room?: A Little Help needed climbing 3-5 steps with a railing? : A Little 6 Click Score: 18    End of Session Equipment Utilized During Treatment: Gait belt Activity Tolerance: Patient tolerated treatment well Patient left: in chair;with call bell/phone within reach;with family/visitor present;with chair alarm set        Time: 8366-2947 PT Time Calculation (min) (ACUTE ONLY): 28 min   Charges:   PT Evaluation $PT Eval Low Complexity: 1 Low PT Treatments $Gait Training: 8-22 mins        Baxter Flattery, PT  Acute Rehab Dept ([place]) [phone] Pager [phone]  05/05/2021   [facility] 05/05/2021, 3:42 PM

## 2021-05-05 NOTE — Discharge Instructions (Addendum)
 Eric Aluisio, MD Total Joint Specialist [facility] [place] [address] [place], [place] [address] [phone]  TOTAL KNEE REPLACEMENT POSTOPERATIVE DIRECTIONS    Knee Rehabilitation, Guidelines Following Surgery  Results after knee surgery are often greatly improved when you follow the exercise, range of motion and muscle strengthening exercises prescribed by your doctor. Safety measures are also important to protect the knee from further injury. If any of these exercises cause you to have increased pain or swelling in your knee joint, decrease the amount until you are comfortable again and slowly increase them. If you have problems or questions, call your caregiver or physical therapist for advice.    BLOOD CLOT PREVENTION Take a 10 mg Xarelto once a day for three weeks following surgery. Then take an 81 mg Aspirin once a day for three weeks. Then discontinue Aspirin. You may resume your vitamins/supplements once you have discontinued the Xarelto. Do not take any NSAIDs (Advil, Aleve, Ibuprofen, Meloxicam, etc.) until you have discontinued the Xarelto.    HOME CARE INSTRUCTIONS  Remove items at home which could result in a fall. This includes throw rugs or furniture in walking pathways.  ICE to the affected knee as much as tolerated. Icing helps control swelling. If the swelling is well controlled you will be more comfortable and rehab easier. Continue to use ice on the knee for pain and swelling from surgery. You may notice swelling that will progress down to the foot and ankle. This is normal after surgery. Elevate the leg when you are not up walking on it.    Continue to use the breathing machine which will help keep your temperature down. It is common for your temperature to cycle up and down following surgery, especially at night when you are not up moving around and exerting yourself. The breathing machine keeps your lungs expanded and your temperature  down. Do not place pillow under the operative knee, focus on keeping the knee straight while resting  DIET You may resume your previous home diet once you are discharged from the hospital.  DRESSING / WOUND CARE / SHOWERING Keep your bulky bandage on for 2 days. On the third post-operative day you may remove the Ace bandage and gauze. There is a waterproof adhesive bandage on your skin which will stay in place until your first follow-up appointment. Once you remove this you will not need to place another bandage You may begin showering 3 days following surgery, but do not submerge the incision under water.  ACTIVITY For the first 5 days, the key is rest and control of pain and swelling Do your home exercises twice a day starting on post-operative day 3. On the days you go to physical therapy, just do the home exercises once that day. You should rest, ice and elevate the leg for 50 minutes out of every hour. Get up and walk/stretch for 10 minutes per hour. After 5 days you can increase your activity slowly as tolerated. Walk with your walker as instructed. Use the walker until you are comfortable transitioning to a cane. Walk with the cane in the opposite hand of the operative leg. You may discontinue the cane once you are comfortable and walking steadily. Avoid periods of inactivity such as sitting longer than an hour when not asleep. This helps prevent blood clots.  You may discontinue the knee immobilizer once you are able to perform a straight leg raise while lying down. You may resume a sexual relationship in one   month or when given the OK by your doctor.  You may return to work once you are cleared by your doctor.  Do not drive a car for 6 weeks or until released by your surgeon.  Do not drive while taking narcotics.  TED HOSE STOCKINGS Wear the elastic stockings on both legs for three weeks following surgery during the day. You may remove them at night for sleeping.  WEIGHT  BEARING Weight bearing as tolerated with assist device (walker, cane, etc) as directed, use it as long as suggested by your surgeon or therapist, typically at least 4-6 weeks.  POSTOPERATIVE CONSTIPATION PROTOCOL Constipation - defined medically as fewer than three stools per week and severe constipation as less than one stool per week.  One of the most common issues patients have following surgery is constipation.  Even if you have a regular bowel pattern at home, your normal regimen is likely to be disrupted due to multiple reasons following surgery.  Combination of anesthesia, postoperative narcotics, change in appetite and fluid intake all can affect your bowels.  In order to avoid complications following surgery, here are some recommendations in order to help you during your recovery period.  Colace (docusate) - Pick up an over-the-counter form of Colace or another stool softener and take twice a day as long as you are requiring postoperative pain medications.  Take with a full glass of water daily.  If you experience loose stools or diarrhea, hold the colace until you stool forms back up. If your symptoms do not get better within 1 week or if they get worse, check with your doctor. Dulcolax (bisacodyl) - Pick up over-the-counter and take as directed by the product packaging as needed to assist with the movement of your bowels.  Take with a full glass of water.  Use this product as needed if not relieved by Colace only.  MiraLax (polyethylene glycol) - Pick up over-the-counter to have on hand. MiraLax is a solution that will increase the amount of water in your bowels to assist with bowel movements.  Take as directed and can mix with a glass of water, juice, soda, coffee, or tea. Take if you go more than two days without a movement. Do not use MiraLax more than once per day. Call your doctor if you are still constipated or irregular after using this medication for 7 days in a row.  If you continue  to have problems with postoperative constipation, please contact the office for further assistance and recommendations.  If you experience "the worst abdominal pain ever" or develop nausea or vomiting, please contact the office immediatly for further recommendations for treatment.  ITCHING If you experience itching with your medications, try taking only a single pain pill, or even half a pain pill at a time.  You can also use Benadryl over the counter for itching or also to help with sleep.   MEDICATIONS See your medication summary on the "After Visit Summary" that the nursing staff will review with you prior to discharge.  You may have some home medications which will be placed on hold until you complete the course of blood thinner medication.  It is important for you to complete the blood thinner medication as prescribed by your surgeon.  Continue your approved medications as instructed at time of discharge.  PRECAUTIONS If you experience chest pain or shortness of breath - call 911 immediately for transfer to the hospital emergency department.  If you develop a fever greater that 101   F, purulent drainage from wound, increased redness or drainage from wound, foul odor from the wound/dressing, or calf pain - CONTACT YOUR SURGEON.                                                   FOLLOW-UP APPOINTMENTS Make sure you keep all of your appointments after your operation with your surgeon and caregivers. You should call the office at the above phone number and make an appointment for approximately two weeks after the date of your surgery or on the date instructed by your surgeon outlined in the "After Visit Summary".  RANGE OF MOTION AND STRENGTHENING EXERCISES  Rehabilitation of the knee is important following a knee injury or an operation. After just a few days of immobilization, the muscles of the thigh which control the knee become weakened and shrink (atrophy). Knee exercises are designed to build up  the tone and strength of the thigh muscles and to improve knee motion. Often times heat used for twenty to thirty minutes before working out will loosen up your tissues and help with improving the range of motion but do not use heat for the first two weeks following surgery. These exercises can be done on a training (exercise) mat, on the floor, on a table or on a bed. Use what ever works the best and is most comfortable for you Knee exercises include:  Leg Lifts - While your knee is still immobilized in a splint or cast, you can do straight leg raises. Lift the leg to 60 degrees, hold for 3 sec, and slowly lower the leg. Repeat 10-20 times 2-3 times daily. Perform this exercise against resistance later as your knee gets better.  Quad and Hamstring Sets - Tighten up the muscle on the front of the thigh (Quad) and hold for 5-10 sec. Repeat this 10-20 times hourly. Hamstring sets are done by pushing the foot backward against an object and holding for 5-10 sec. Repeat as with quad sets.  Leg Slides: Lying on your back, slowly slide your foot toward your buttocks, bending your knee up off the floor (only go as far as is comfortable). Then slowly slide your foot back down until your leg is flat on the floor again. Angel Wings: Lying on your back spread your legs to the side as far apart as you can without causing discomfort.  A rehabilitation program following serious knee injuries can speed recovery and prevent re-injury in the future due to weakened muscles. Contact your doctor or a physical therapist for more information on knee rehabilitation.   POST-OPERATIVE OPIOID TAPER INSTRUCTIONS: It is important to wean off of your opioid medication as soon as possible. If you do not need pain medication after your surgery it is ok to stop day one. Opioids include: Codeine, Hydrocodone(Norco, Vicodin), Oxycodone(Percocet, oxycontin) and hydromorphone amongst others.  Long term and even short term use of opiods can  cause: Increased pain response Dependence Constipation Depression Respiratory depression And more.  Withdrawal symptoms can include Flu like symptoms Nausea, vomiting And more Techniques to manage these symptoms Hydrate well Eat regular healthy meals Stay active Use relaxation techniques(deep breathing, meditating, yoga) Do Not substitute Alcohol to help with tapering If you have been on opioids for less than two weeks and do not have pain than it is ok to stop all together.    Plan to wean off of opioids This plan should start within one week post op of your joint replacement. Maintain the same interval or time between taking each dose and first decrease the dose.  Cut the total daily intake of opioids by one tablet each day Next start to increase the time between doses. The last dose that should be eliminated is the evening dose.   IF YOU ARE TRANSFERRED TO A SKILLED REHAB FACILITY If the patient is transferred to a skilled rehab facility following release from the hospital, a list of the current medications will be sent to the facility for the patient to continue.  When discharged from the skilled rehab facility, please have the facility set up the patient's Home Health Physical Therapy prior to being released. Also, the skilled facility will be responsible for providing the patient with their medications at time of release from the facility to include their pain medication, the muscle relaxants, and their blood thinner medication. If the patient is still at the rehab facility at time of the two week follow up appointment, the skilled rehab facility will also need to assist the patient in arranging follow up appointment in our office and any transportation needs.  MAKE SURE YOU:  Understand these instructions.  Get help right away if you are not doing well or get worse.   DENTAL ANTIBIOTICS:  In most cases prophylactic antibiotics for Dental procdeures after total joint surgery are  not necessary.  Exceptions are as follows:  1. History of prior total joint infection  2. Severely immunocompromised (Organ Transplant, cancer chemotherapy, Rheumatoid biologic meds such as Humera)  3. Poorly controlled diabetes (A1C &gt; 8.0, blood glucose over 200)  If you have one of these conditions, contact your surgeon for an antibiotic prescription, prior to your dental procedure.    Pick up stool softner and laxative for home use following surgery while on pain medications. Do not submerge incision under water. Please use good hand washing techniques while changing dressing each day. May shower starting three days after surgery. Please use a clean towel to pat the incision dry following showers. Continue to use ice for pain and swelling after surgery. Do not use any lotions or creams on the incision until instructed by your surgeon.      Information on my medicine - XARELTO (Rivaroxaban)  This medication education was reviewed with me or my healthcare representative as part of my discharge preparation.    Why was Xarelto prescribed for you? Xarelto was prescribed for you to reduce the risk of blood clots forming after orthopedic surgery. The medical term for these abnormal blood clots is venous thromboembolism (VTE).  What do you need to know about xarelto ? Take your Xarelto ONCE DAILY at the same time every day. You may take it either with or without food.  If you have difficulty swallowing the tablet whole, you may crush it and mix in applesauce just prior to taking your dose.  Take Xarelto exactly as prescribed by your doctor and DO NOT stop taking Xarelto without talking to the doctor who prescribed the medication.  Stopping without other VTE prevention medication to take the place of Xarelto may increase your risk of developing a clot.  After discharge, you should have regular check-up appointments with your healthcare provider that is prescribing your  Xarelto.    What do you do if you miss a dose? If you miss a dose, take it as soon as you remember on the same   day then continue your regularly scheduled once daily regimen the next day. Do not take two doses of Xarelto on the same day.   Important Safety Information A possible side effect of Xarelto is bleeding. You should call your healthcare provider right away if you experience any of the following: Bleeding from an injury or your nose that does not stop. Unusual colored urine (red or dark brown) or unusual colored stools (red or black). Unusual bruising for unknown reasons. A serious fall or if you hit your head (even if there is no bleeding).  Some medicines may interact with Xarelto and might increase your risk of bleeding while on Xarelto. To help avoid this, consult your healthcare provider or pharmacist prior to using any new prescription or non-prescription medications, including herbals, vitamins, non-steroidal anti-inflammatory drugs (NSAIDs) and supplements.  This website has more information on Xarelto: www.xarelto.com.   

## 2021-05-05 NOTE — Anesthesia Procedure Notes (Signed)
Spinal  Patient location during procedure: OR Start time: 05/05/2021 9:28 AM End time: 05/05/2021 9:32 AM Reason for block: surgical anesthesia Staffing Performed: resident/CRNA  Resident/CRNA: Niel Hummer, CRNA Preanesthetic Checklist Completed: patient identified, IV checked, risks and benefits discussed, surgical consent, monitors and equipment checked, pre-op evaluation and timeout performed Spinal Block Patient position: sitting Prep: DuraPrep Patient monitoring: heart rate, continuous pulse ox and blood pressure Approach: midline Location: L3-4 Injection technique: single-shot Needle Needle type: Pencan  Needle gauge: 24 G Needle length: 9 cm Additional Notes Expiration date of kit checked. Clear CSF return noted prior to injection

## 2021-05-05 NOTE — Interval H&P Note (Signed)
History and Physical Interval Note:  05/05/2021 6:58 AM  Eric [name]  has presented today for surgery, with the diagnosis of right knee osteoarthritis.  The various methods of treatment have been discussed with the patient and family. After consideration of risks, benefits and other options for treatment, the patient has consented to  Procedure(s): TOTAL KNEE ARTHROPLASTY (Right) as a surgical intervention.  The patient's history has been reviewed, patient examined, no change in status, stable for surgery.  I have reviewed the patient's chart and labs.  Questions were answered to the patient's satisfaction.     Pilar Plate Md [place]

## 2021-05-05 NOTE — Anesthesia Procedure Notes (Signed)
Anesthesia Procedure Image    

## 2021-05-05 NOTE — Anesthesia Procedure Notes (Signed)
Procedure Name: MAC Date/Time: 05/05/2021 9:28 AM Performed by: Niel Hummer, CRNA Pre-anesthesia Checklist: Emergency Drugs available, Patient identified, Suction available and Patient being monitored Oxygen Delivery Method: Simple face mask

## 2021-05-05 NOTE — Progress Notes (Signed)
Orthopedic Tech Progress Note Patient Details:  Eric [name] 1951-02-03 [id number]  CPM Right Knee CPM Right Knee: On Right Knee Flexion (Degrees): 40 Right Knee Extension (Degrees): 10  Post Interventions Patient Tolerated: Well Instructions Provided: Care of device, Adjustment of device  [name] 05/05/2021, 11:16 AM

## 2021-05-05 NOTE — Care Plan (Signed)
Ortho Bundle Case Management Note  Patient Details  Name: Eric [name] MRN: [id number] Date of Birth: January 31, 1951  R TKA on 05-05-21 DCP:  Home with wife.  2 story home with 2 ste.   DME:  No needs.  Has a RW and elevated toilets.  PT:  [facility] on 05-07-21.                   DME Arranged:  N/A DME Agency:  NA  HH Arranged:  NA HH Agency:  NA  Additional Comments: Please contact me with any questions of if this plan should need to change.  Marianne Sofia, RN,CCM [facility]  [phone] 05/05/2021, 3:10 PM

## 2021-05-05 NOTE — Anesthesia Preprocedure Evaluation (Signed)
Anesthesia Evaluation  Patient identified by MRN, date of birth, ID band Patient awake    Reviewed: Allergy & Precautions, NPO status , Patient's Chart, lab work & pertinent test results  Airway Mallampati: II  TM Distance: >3 FB Neck ROM: Full    Dental no notable dental hx.    Pulmonary neg pulmonary ROS, former smoker,    Pulmonary exam normal breath sounds clear to auscultation       Cardiovascular hypertension, Pt. on medications Normal cardiovascular exam Rhythm:Regular Rate:Normal     Neuro/Psych negative neurological ROS  negative psych ROS   GI/Hepatic negative GI ROS, Neg liver ROS,   Endo/Other  negative endocrine ROS  Renal/GU negative Renal ROS  negative genitourinary   Musculoskeletal  (+) Arthritis , Osteoarthritis,    Abdominal   Peds negative pediatric ROS (+)  Hematology negative hematology ROS (+)   Anesthesia Other Findings   Reproductive/Obstetrics negative OB ROS                             Anesthesia Physical Anesthesia Plan  ASA: 2  Anesthesia Plan: Spinal   Post-op Pain Management:  Regional for Post-op pain   Induction: Intravenous  PONV Risk Score and Plan: 2 and Ondansetron, Dexamethasone, Propofol infusion and Treatment may vary due to age or medical condition  Airway Management Planned: Simple Face Mask  Additional Equipment:   Intra-op Plan:   Post-operative Plan:   Informed Consent: I have reviewed the patients History and Physical, chart, labs and discussed the procedure including the risks, benefits and alternatives for the proposed anesthesia with the patient or authorized representative who has indicated his/her understanding and acceptance.     Dental advisory given  Plan Discussed with: CRNA and Surgeon  Anesthesia Plan Comments:         Anesthesia Quick Evaluation

## 2021-05-05 NOTE — Plan of Care (Signed)
  Problem: Activity: Goal: Ability to avoid complications of mobility impairment will improve Outcome: Progressing   Problem: Pain Management: Goal: Pain level will decrease with appropriate interventions Outcome: Progressing   Problem: Elimination: Goal: Will not experience complications related to urinary retention Outcome: Progressing   Problem: Safety: Goal: Ability to remain free from injury will improve Outcome: Progressing

## 2021-05-05 NOTE — Transfer of Care (Signed)
Immediate Anesthesia Transfer of Care Note  Patient: Eric [name]  Procedure(s) Performed: TOTAL KNEE ARTHROPLASTY (Right: Knee)  Patient Location: PACU  Anesthesia Type:Spinal  Level of Consciousness: awake, alert  and oriented  Airway & Oxygen Therapy: Patient Spontanous Breathing and Patient connected to face mask oxygen  Post-op Assessment: Report given to RN and Post -op Vital signs reviewed and stable  Post vital signs: Reviewed and stable  Last Vitals:  Vitals Value Taken Time  BP 99/71   Temp    Pulse 71 05/05/21 1107  Resp    SpO2 97 % 05/05/21 1107  Vitals shown include unvalidated device data.  Last Pain:  Vitals:   05/05/21 0707  TempSrc:   PainSc: 0-No pain         Complications: No notable events documented.

## 2021-05-05 NOTE — Progress Notes (Signed)
Assisted Dr.George Rose with  Right Knee Adductor Canal block. Side rails up, monitors on throughout procedure. See vital signs in flow sheet. Tolerated Procedure well.  

## 2021-05-05 NOTE — Anesthesia Procedure Notes (Addendum)
Anesthesia Regional Block: Adductor canal block   Pre-Anesthetic Checklist: , timeout performed,  Correct Patient, Correct Site, Correct Laterality,  Correct Procedure, Correct Position, site marked,  Risks and benefits discussed,  Surgical consent,  Pre-op evaluation,  At surgeon's request and post-op pain management  Laterality: Right  Prep: chloraprep       Needles:  Injection technique: Single-shot  Needle Type: Echogenic Needle     Needle Length: 9cm      Additional Needles:   Procedures:,,,, ultrasound used (permanent image in chart),,    Narrative:  Start time: 05/05/2021 8:45 AM End time: 05/05/2021 8:51 AM Injection made incrementally with aspirations every 5 mL.  Performed by: Personally  Anesthesiologist: Myrtie Soman, MD  Additional Notes: Patient tolerated the procedure well without complications

## 2021-05-05 NOTE — Anesthesia Postprocedure Evaluation (Signed)
Anesthesia Post Note  Patient: ETAI COPADO  Procedure(s) Performed: TOTAL KNEE ARTHROPLASTY (Right: Knee)     Patient location during evaluation: PACU Anesthesia Type: Spinal Level of consciousness: oriented and awake and alert Pain management: pain level controlled Vital Signs Assessment: post-procedure vital signs reviewed and stable Respiratory status: spontaneous breathing, respiratory function stable and patient connected to nasal cannula oxygen Cardiovascular status: blood pressure returned to baseline and stable Postop Assessment: no headache, no backache and no apparent nausea or vomiting Anesthetic complications: no   No notable events documented.  Last Vitals:  Vitals:   05/05/21 1145 05/05/21 1215  BP: 102/74   Pulse: 61   Resp: 17 12  Temp:    SpO2: 99%     Last Pain:  Vitals:   05/05/21 1215  TempSrc:   PainSc: 0-No pain    LLE Motor Response: Purposeful movement (05/05/21 1215) LLE Sensation: Decreased (05/05/21 1215) RLE Motor Response: Purposeful movement (05/05/21 1215) RLE Sensation: Decreased (05/05/21 1215) L Sensory Level: L4-Anterior knee, lower leg (05/05/21 1215) R Sensory Level: L4-Anterior knee, lower leg (05/05/21 1215)  Maryann Mccall S

## 2021-05-05 NOTE — Op Note (Signed)
OPERATIVE REPORT-TOTAL KNEE ARTHROPLASTY   Pre-operative diagnosis- Osteoarthritis  Right knee(s)  Post-operative diagnosis- Osteoarthritis Right knee(s)  Procedure-  Right  Total Knee Arthroplasty  Surgeon- Dione Plover. Tranae Laramie, MD  Assistant- Molli Barrows, PA-C   Anesthesia-   Adductor canal block and spinal  EBL-50 mL   Drains None  Tourniquet time-  Total Tourniquet Time Documented: Thigh (Right) - 38 minutes Total: Thigh (Right) - 38 minutes     Complications- None  Condition-PACU - hemodynamically stable.   Brief Clinical Note  Eric [name] is a 70 y.o. year old male with end stage OA of his right knee with progressively worsening pain and dysfunction. He has constant pain, with activity and at rest and significant functional deficits with difficulties even with ADLs. He has had extensive non-op management including analgesics, injections of cortisone and viscosupplements, and home exercise program, but remains in significant pain with significant dysfunction. Radiographs show bone on bone arthritis Medial and patellofemoral . He presents now for right Total Knee Arthroplasty.    Procedure in detail---   The patient is brought into the operating room and positioned supine on the operating table. After successful administration of adductor canal block and spinal,   a tourniquet is placed high on the  Right thigh(s) and the lower extremity is prepped and draped in the usual sterile fashion. Time out is performed by the operating team and then the  Right lower extremity is wrapped in Esmarch, knee flexed and the tourniquet inflated to 300 mmHg.       A midline incision is made with a ten blade through the subcutaneous tissue to the level of the extensor mechanism. A fresh blade is used to make a medial parapatellar arthrotomy. Soft tissue over the proximal medial tibia is subperiosteally elevated to the joint line with a knife and into the semimembranosus bursa with a Cobb  elevator. Soft tissue over the proximal lateral tibia is elevated with attention being paid to avoiding the patellar tendon on the tibial tubercle. The patella is everted, knee flexed 90 degrees and the ACL and PCL are removed. Findings are bone on bone medial and patellofemoral with large global osteophytes.        The drill is used to create a starting hole in the distal femur and the canal is thoroughly irrigated with sterile saline to remove the fatty contents. The 5 degree Right  valgus alignment guide is placed into the femoral canal and the distal femoral cutting block is pinned to remove 9 mm off the distal femur. Resection is made with an oscillating saw.      The tibia is subluxed forward and the menisci are removed. The extramedullary alignment guide is placed referencing proximally at the medial aspect of the tibial tubercle and distally along the second metatarsal axis and tibial crest. The block is pinned to remove 20mm off the more deficient medial  side. Resection is made with an oscillating saw. Size 7is the most appropriate size for the tibia and the proximal tibia is prepared with the modular drill and keel punch for that size.      The femoral sizing guide is placed and size 7 is most appropriate. Rotation is marked off the epicondylar axis and confirmed by creating a rectangular flexion gap at 90 degrees. The size 7 cutting block is pinned in this rotation and the anterior, posterior and chamfer cuts are made with the oscillating saw. The intercondylar block is then placed and that cut is made.  Trial size 7 tibial component, trial size 7 posterior stabilized femur and a 7  mm posterior stabilized rotating platform insert trial is placed. Full extension is achieved with excellent varus/valgus and anterior/posterior balance throughout full range of motion. The patella is everted and thickness measured to be 27  mm. Free hand resection is taken to 15 mm, a 41 template is placed, lug holes  are drilled, trial patella is placed, and it tracks normally. Osteophytes are removed off the posterior femur with the trial in place. All trials are removed and the cut bone surfaces prepared with pulsatile lavage. Cement is mixed and once ready for implantation, the size 7 tibial implant, size  7 posterior stabilized femoral component, and the size 41 patella are cemented in place and the patella is held with the clamp. The trial insert is placed and the knee held in full extension. The Exparel (20 ml mixed with 60 ml saline) is injected into the extensor mechanism, posterior capsule, medial and lateral gutters and subcutaneous tissues.  All extruded cement is removed and once the cement is hard the permanent 7 mm posterior stabilized rotating platform insert is placed into the tibial tray.      The wound is copiously irrigated with saline solution and the extensor mechanism closed with # 0 Stratofix suture. The tourniquet is released for a total tourniquet time of 38  minutes. Flexion against gravity is 140 degrees and the patella tracks normally. Subcutaneous tissue is closed with 2.0 vicryl and subcuticular with running 4.0 Monocryl. The incision is cleaned and dried and steri-strips and a bulky sterile dressing are applied. The limb is placed into a knee immobilizer and the patient is awakened and transported to recovery in stable condition.      Please note that a surgical assistant was a medical necessity for this procedure in order to perform it in a safe and expeditious manner. Surgical assistant was necessary to retract the ligaments and vital neurovascular structures to prevent injury to them and also necessary for proper positioning of the limb to allow for anatomic placement of the prosthesis.   Dione Plover Heleena Miceli, MD    05/05/2021, 10:35 AM

## 2021-05-05 NOTE — Progress Notes (Signed)
Orthopedic Tech Progress Note Patient Details:  Eric [name] 04/13/1951 [id number]  CPM Right Knee CPM Right Knee: Off Right Knee Flexion (Degrees): 40 Right Knee Extension (Degrees): 10  Post Interventions Patient Tolerated: Well Instructions Provided: Care of device, Adjustment of device  [name] 05/05/2021, 2:50 PM

## 2021-05-06 ENCOUNTER — Encounter (HOSPITAL_COMMUNITY): Payer: Self-pay | Admitting: Orthopedic Surgery

## 2021-05-06 DIAGNOSIS — M1611 Unilateral primary osteoarthritis, right hip: Secondary | ICD-10-CM | POA: Diagnosis not present

## 2021-05-06 LAB — CBC
HCT: 37.7 % — ABNORMAL LOW (ref 39.0–52.0)
Hemoglobin: 12.8 g/dL — ABNORMAL LOW (ref 13.0–17.0)
MCH: 34.2 pg — ABNORMAL HIGH (ref 26.0–34.0)
MCHC: 34 g/dL (ref 30.0–36.0)
MCV: 100.8 fL — ABNORMAL HIGH (ref 80.0–100.0)
Platelets: 228 10*3/uL (ref 150–400)
RBC: 3.74 MIL/uL — ABNORMAL LOW (ref 4.22–5.81)
RDW: 12 % (ref 11.5–15.5)
WBC: 12.5 10*3/uL — ABNORMAL HIGH (ref 4.0–10.5)
nRBC: 0 % (ref 0.0–0.2)

## 2021-05-06 LAB — BASIC METABOLIC PANEL
Anion gap: 6 (ref 5–15)
BUN: 15 mg/dL (ref 8–23)
CO2: 24 mmol/L (ref 22–32)
Calcium: 8.7 mg/dL — ABNORMAL LOW (ref 8.9–10.3)
Chloride: 105 mmol/L (ref 98–111)
Creatinine, Ser: 1.05 mg/dL (ref 0.61–1.24)
GFR, Estimated: >60 mL/min (ref >60)
Glucose, Bld: 135 mg/dL — ABNORMAL HIGH (ref 70–99)
Potassium: 4.3 mmol/L (ref 3.5–5.1)
Sodium: 135 mmol/L (ref 135–145)

## 2021-05-06 MED ORDER — GABAPENTIN 300 MG PO CAPS: ORAL_CAPSULE | ORAL | 0 refills | Status: DC

## 2021-05-06 MED ORDER — OXYCODONE HCL 5 MG PO TABS: 5.0000 mg | ORAL_TABLET | Freq: Four times a day (QID) | ORAL | 0 refills | Status: DC | PRN

## 2021-05-06 MED ORDER — RIVAROXABAN 10 MG PO TABS: 10.0000 mg | ORAL_TABLET | Freq: Every day | ORAL | 0 refills | Status: AC

## 2021-05-06 MED ORDER — METHOCARBAMOL 500 MG PO TABS
500.0000 mg | ORAL_TABLET | Freq: Four times a day (QID) | ORAL | 0 refills | Status: DC | PRN
Start: 2021-05-06 — End: 2021-07-16

## 2021-05-06 NOTE — TOC Transition Note (Signed)
Transition of Care [facility]) - CM/SW Discharge Note  Patient Details  Name: Eric [name] MRN: [id number] Date of Birth: [birth year]  [facility] CM/SW Contact:  Sherie Don, LCSW Phone Number: 05/06/2021, 12:17 PM  Clinical Narrative: Patient is expected to discharge after working with PT. CSW met with patient to review discharge plan. Patient will discharge home with OPPT at [facility] on 05/07/21. Patient has a rolling walker and elevated toilets at home, so there are no DME needs at this time. TOC signing off.  Final next level of care: OP Rehab Barriers to Discharge: No Barriers Identified  Patient Goals and CMS Choice Patient states their goals for this hospitalization and ongoing recovery are:: Discharge home with OPPT Choice offered to / list presented to : NA  Discharge Plan and Services       DME Arranged: N/A DME Agency: NA HH Arranged: NA HH Agency: NA  Readmission Risk Interventions No flowsheet data found.

## 2021-05-06 NOTE — Progress Notes (Signed)
05/06/21 1400  PT Visit Information  Last PT Received On 05/06/21  Assistance Needed +1  Excellent progress, cautioned pt and regarding RW safety and use at home.   History of Present Illness 70 yo male s/p R TKA. PMH: L TKA on 11/25/2020, labile HTN, OA  Subjective Data  Patient Stated Goal back to independence  Precautions  Precautions Fall;Knee  Required Braces or Orthoses Knee Immobilizer - Right  Knee Immobilizer - Right Discontinue once straight leg raise with < 10 degree lag  Restrictions  RLE Weight Bearing WBAT  Pain Assessment  Pain Assessment 0-10  Pain Score 4  Pain Location right knee  Pain Descriptors / Indicators Grimacing;Sore  Pain Intervention(s) Limited activity within patient's tolerance;Monitored during session;Patient requesting pain meds-RN notified  Cognition  Arousal/Alertness Awake/alert  Behavior During Therapy WFL for tasks assessed/performed  Overall Cognitive Status Within Functional Limits for tasks assessed  Transfers  Overall transfer level Needs assistance  Equipment used Rolling walker (2 wheels)  Transfers Sit to/from Stand  Sit to Stand Supervision  General transfer comment cues for hand placement, supervision for safety  Ambulation/Gait  Ambulation/Gait assistance Supervision;Min guard  Gait Distance (Feet) 240 Feet  Assistive device Rolling walker (2 wheels)  Gait Pattern/deviations Step-to pattern;Decreased stance time - right  General Gait Details cues for R knee flexion and heel strike, terminal knee extension in stance on R. visual and verbal cues to keep both feet in side RW, especially with turns  Stairs Yes  Stairs assistance Min guard  Stair Management Two rails;Step to pattern;Forwards  Number of Stairs 5  General stair comments cues for sequence and technique  Total Joint Exercises  Ankle Circles/Pumps AROM;Both;5 reps  Quad Sets AROM;Both;5 reps  Heel Slides Other (comment) (3 reps, reviewed for pt to practice later)   Straight Leg Raises AROM;Right;5 reps  PT - End of Session  Equipment Utilized During Treatment Gait belt  Activity Tolerance Patient tolerated treatment well  Patient left in chair;with call bell/phone within reach;with chair alarm set  Nurse Communication Mobility status   PT - Assessment/Plan  PT Plan Current plan remains appropriate  PT Visit Diagnosis Unsteadiness on feet (R26.81);Muscle weakness (generalized) (M62.81)  PT Frequency (ACUTE ONLY) 7X/week  Follow Up Recommendations Follow physician's recommendations for discharge plan and follow up therapies  Assistance recommended at discharge Set up Supervision/Assistance  PT equipment None recommended by PT  AM-PAC PT "6 Clicks" Mobility Outcome Measure (Version 2)  Help needed turning from your back to your side while in a flat bed without using bedrails? 3  Help needed moving from lying on your back to sitting on the side of a flat bed without using bedrails? 3  Help needed moving to and from a bed to a chair (including a wheelchair)? 3  Help needed standing up from a chair using your arms (e.g., wheelchair or bedside chair)? 3  Help needed to walk in hospital room? 3  Help needed climbing 3-5 steps with a railing?  3  6 Click Score 18  Consider Recommendation of Discharge To: Home with [facility]  PT Goal Progression  Progress towards PT goals Progressing toward goals  Acute Rehab PT Goals  PT Goal Formulation With patient  Time For Goal Achievement 05/12/21  Potential to Achieve Goals Good  PT Time Calculation  PT Start Time (ACUTE ONLY) 1406  PT Stop Time (ACUTE ONLY) 1427  PT Time Calculation (min) (ACUTE ONLY) 21 min  PT General Charges  $$ ACUTE PT VISIT  1 Visit  PT Treatments  $Gait Training 8-22 mins

## 2021-05-06 NOTE — Progress Notes (Signed)
Physical Therapy Treatment Patient Details Name: Eric [name] MRN: [id number] DOB: 1951-02-03 Today's Date: 05/06/2021   History of Present Illness 70 yo male s/p R TKA. PMH: L TKA on 11/25/2020, labile HTN, OA    PT Comments    Pt progressing well this am, slightly more pain and soreness however continues to work well with PT. Will see for a second session and pt should be ready to d/c later today.   Recommendations for follow up therapy are one component of a multi-disciplinary discharge planning process, led by the attending physician.  Recommendations may be updated based on patient status, additional functional criteria and insurance authorization.  Follow Up Recommendations  Follow physician's recommendations for discharge plan and follow up therapies     Assistance Recommended at Discharge Set up Supervision/Assistance  Equipment Recommendations  None recommended by PT    Recommendations for Other Services       Precautions / Restrictions Precautions Precautions: Fall;Knee Required Braces or Orthoses: Knee Immobilizer - Right Knee Immobilizer - Right: Discontinue once straight leg raise with < 10 degree lag Restrictions Weight Bearing Restrictions: No RLE Weight Bearing: Weight bearing as tolerated     Mobility  Bed Mobility   Bed Mobility: Supine to Sit     Supine to sit: Supervision;Min guard     General bed mobility comments: for safety, stood without device with LOB and min/guard to recover    Transfers Overall transfer level: Needs assistance Equipment used: Rolling walker (2 wheels) Transfers: Sit to/from Stand Sit to Stand: Supervision;Min guard           General transfer comment: cues for hand placement, supervision for safety    Ambulation/Gait Ambulation/Gait assistance: Supervision;Min guard Gait Distance (Feet): 360 Feet Assistive device: Rolling walker (2 wheels) Gait Pattern/deviations: Step-to pattern;Decreased stance time -  right       General Gait Details: cues for R knee flexion and heel strike, terminal knee extension in stance on R   Stairs             Wheelchair Mobility    Modified Rankin (Stroke Patients Only)       Balance                                            Cognition Arousal/Alertness: Awake/alert Behavior During Therapy: WFL for tasks assessed/performed Overall Cognitive Status: Within Functional Limits for tasks assessed                                          Exercises Total Joint Exercises Ankle Circles/Pumps: AROM;Both;5 reps    General Comments        Pertinent Vitals/Pain Pain Assessment: 0-10 Pain Score: 4  Pain Location: right knee Pain Descriptors / Indicators: Grimacing;Sore Pain Intervention(s): Limited activity within patient's tolerance;Monitored during session    Home Living                          Prior Function            PT Goals (current goals can now be found in the care plan section) Acute Rehab PT Goals Patient Stated Goal: back to independence PT Goal Formulation: With patient Time For Goal Achievement: 05/12/21 Potential to Achieve Goals:  Good Progress towards PT goals: Progressing toward goals    Frequency    7X/week      PT Plan Current plan remains appropriate    Co-evaluation              AM-PAC PT "6 Clicks" Mobility   Outcome Measure  Help needed turning from your back to your side while in a flat bed without using bedrails?: A Little Help needed moving from lying on your back to sitting on the side of a flat bed without using bedrails?: A Little Help needed moving to and from a bed to a chair (including a wheelchair)?: A Little Help needed standing up from a chair using your arms (e.g., wheelchair or bedside chair)?: A Little Help needed to walk in hospital room?: A Little Help needed climbing 3-5 steps with a railing? : A Little 6 Click Score: 18     End of Session Equipment Utilized During Treatment: Gait belt Activity Tolerance: Patient tolerated treatment well Patient left: in chair;with call bell/phone within reach;with chair alarm set Nurse Communication: Mobility status PT Visit Diagnosis: Unsteadiness on feet (R26.81);Muscle weakness (generalized) (M62.81)     Time: 4103-0131 PT Time Calculation (min) (ACUTE ONLY): 23 min  Charges:  $Gait Training: 8-22 mins                     Baxter Flattery, PT  Acute Rehab Dept ([place]) [phone] Pager [phone]  05/06/2021    [facility] 05/06/2021, 1:13 PM

## 2021-05-06 NOTE — Progress Notes (Signed)
   Subjective: 1 Day Post-Op Procedure(s) (LRB): TOTAL KNEE ARTHROPLASTY (Right) Patient reports pain as mild.   Patient seen in rounds by Dr. Wynelle Link. Patient is well, and has had no acute complaints or problems. States he is ready to go home. Denies chest pain or SOB. Foley catheter removed.  We will continue therapy today, ambulated 150' yesterday.   Objective: Vital signs in last 24 hours: Temp:  [96.9 F (36.1 C)-98.3 F (36.8 C)] 98.3 F (36.8 C) (11/15 0523) Pulse Rate:  [37-82] 79 (11/15 0523) Resp:  [10-19] 16 (11/15 0523) BP: (94-155)/(61-104) 107/70 (11/15 0523) SpO2:  [87 %-100 %] 97 % (11/15 0523)  Intake/Output from previous day:  Intake/Output Summary (Last 24 hours) at 05/06/2021 0802 Last data filed at 05/06/2021 0600 Gross per 24 hour  Intake 3195.07 ml  Output 2025 ml  Net 1170.07 ml     Intake/Output this shift: No intake/output data recorded.  Labs: Recent Labs    05/06/21 0324  HGB 12.8*   Recent Labs    05/06/21 0324  WBC 12.5*  RBC 3.74*  HCT 37.7*  PLT 228   Recent Labs    05/06/21 0324  NA 135  K 4.3  CL 105  CO2 24  BUN 15  CREATININE 1.05  GLUCOSE 135*  CALCIUM 8.7*   No results for input(s): LABPT, INR in the last 72 hours.  Exam: General - Patient is Alert and Oriented Extremity - Neurologically intact Neurovascular intact Sensation intact distally Dorsiflexion/Plantar flexion intact Dressing - dressing C/D/I Motor Function - intact, moving foot and toes well on exam.   Past Medical History:  Diagnosis Date   Arteriosclerosis of aorta ([place])    Arthritis    Basal cell carcinoma    GERD (gastroesophageal reflux disease)    Hyperlipidemia    Labile hypertension    pt denies    Other testicular hypofunction    Prediabetes    (A1c 5.8% / 2013)   Vitamin D deficiency     Assessment/Plan: 1 Day Post-Op Procedure(s) (LRB): TOTAL KNEE ARTHROPLASTY (Right) Active Problems:   Primary osteoarthritis of right  knee  Estimated body mass index is 28.88 kg/m as calculated from the following:   Height as of this encounter: 6' (1.829 m).   Weight as of this encounter: 96.6 kg. Advance diet Up with therapy D/C IV fluids   Patient's anticipated LOS is less than 2 midnights, meeting these requirements: - Lives within 1 hour of care - Has a competent adult at home to recover with post-op recover - NO history of  - Chronic pain requiring opioids  - Diabetes  - Coronary Artery Disease  - Heart failure  - Heart attack  - Stroke  - DVT/VTE  - Cardiac arrhythmia  - Respiratory Failure/COPD  - Renal failure  - Anemia  - Advanced Liver disease  DVT Prophylaxis - Xarelto Weight bearing as tolerated. Continue therapy.  Plan is to go Home after hospital stay. Plan for discharge later today once cleared by PT Scheduled for OPPT at [facility] Follow-up in the office in 2 weeks  The [place] was reviewed today prior to any opioid medications being prescribed to this patient.  Theresa Duty, PA-C Orthopedic Surgery [phone] 05/06/2021, 8:02 AM

## 2021-05-13 DIAGNOSIS — M25661 Stiffness of right knee, not elsewhere classified: Secondary | ICD-10-CM | POA: Diagnosis not present

## 2021-05-21 NOTE — Discharge Summary (Signed)
Physician Discharge Summary   Patient ID: Eric [name] MRN: [id number] DOB/AGE: [birth year] 70 y.o.  Admit date: 05/05/2021 Discharge date: 05/06/2021  Primary Diagnosis: Osteoarthritis, right knee   Admission Diagnoses:  Past Medical History:  Diagnosis Date   Arteriosclerosis of aorta ([place])    Arthritis    Basal cell carcinoma    GERD (gastroesophageal reflux disease)    Hyperlipidemia    Labile hypertension    pt denies    Other testicular hypofunction    Prediabetes    (A1c 5.8% / 2013)   Vitamin D deficiency    Discharge Diagnoses:   Active Problems:   Primary osteoarthritis of right knee  Estimated body mass index is 28.88 kg/m as calculated from the following:   Height as of this encounter: 6' (1.829 m).   Weight as of this encounter: 96.6 kg.  Procedure:  Procedure(s) (LRB): TOTAL KNEE ARTHROPLASTY (Right)   Consults: None  HPI: Eric [name] is a 70 y.o. year old male with end stage OA of his right knee with progressively worsening pain and dysfunction. He has constant pain, with activity and at rest and significant functional deficits with difficulties even with ADLs. He has had extensive non-op management including analgesics, injections of cortisone and viscosupplements, and home exercise program, but remains in significant pain with significant dysfunction. Radiographs show bone on bone arthritis Medial and patellofemoral . He presents now for right Total Knee Arthroplasty.    Laboratory Data: Admission on 05/05/2021, Discharged on 05/06/2021  Component Date Value Ref Range Status   Glucose-Capillary 05/05/2021 119 (H)  70 - 99 mg/dL Final   Glucose reference range applies only to samples taken after fasting for at least 8 hours.   WBC 05/06/2021 12.5 (H)  4.0 - 10.5 K/uL Final   RBC 05/06/2021 3.74 (L)  4.22 - 5.81 MIL/uL Final   Hemoglobin 05/06/2021 12.8 (L)  13.0 - 17.0 g/dL Final   HCT 05/06/2021 37.7 (L)  39.0 - 52.0 % Final   MCV  05/06/2021 100.8 (H)  80.0 - 100.0 fL Final   MCH 05/06/2021 34.2 (H)  26.0 - 34.0 pg Final   MCHC 05/06/2021 34.0  30.0 - 36.0 g/dL Final   RDW 05/06/2021 12.0  11.5 - 15.5 % Final   Platelets 05/06/2021 228  150 - 400 K/uL Final   nRBC 05/06/2021 0.0  0.0 - 0.2 % Final   Performed at [facility], [place] [address], [place], [place] [address]   Sodium 05/06/2021 135  135 - 145 mmol/L Final   Potassium 05/06/2021 4.3  3.5 - 5.1 mmol/L Final   Chloride 05/06/2021 105  98 - 111 mmol/L Final   CO2 05/06/2021 24  22 - 32 mmol/L Final   Glucose, Bld 05/06/2021 135 (H)  70 - 99 mg/dL Final   Glucose reference range applies only to samples taken after fasting for at least 8 hours.   BUN 05/06/2021 15  8 - 23 mg/dL Final   Creatinine, Ser 05/06/2021 1.05  0.61 - 1.24 mg/dL Final   Calcium 05/06/2021 8.7 (L)  8.9 - 10.3 mg/dL Final   GFR, Estimated 05/06/2021 >60  >60 mL/min Final   Comment: (NOTE) Calculated using the CKD-EPI Creatinine Equation (2021)    Anion gap 05/06/2021 6  5 - 15 Final   Performed at [facility], [place] [address], [place], [place] [address]  Orders Only on 05/01/2021  Component Date Value Ref Range Status   SARS Coronavirus 2  05/01/2021 RESULT: NEGATIVE   Final   Comment: RESULT: NEGATIVESARS-CoV-2 INTERPRETATION:A NEGATIVE  test result means that SARS-CoV-2 RNA was not present in the specimen above the limit of detection of this test. This does not preclude a possible SARS-CoV-2 infection and should not be used as the  sole basis for patient management decisions. Negative results must be combined with clinical observations, patient history, and epidemiological information. Optimum specimen types and timing for peak viral levels during infections caused by SARS-CoV-2  have not been determined. Collection of multiple specimens or types of specimens may be necessary to detect virus. Improper specimen collection and handling, sequence  variability under primers/probes, or organism present below the limit of detection may  lead to false negative results. Positive and negative predictive values of testing are highly dependent on prevalence. False negative test results are more likely when prevalence of disease is high.The expected result is NEGATIVE.Fact S                          heet for  Healthcare Providers: LocalChronicle.no Sheet for Patients: SalonLookup.es Reference Range - Negative   Hospital Outpatient Visit on 04/30/2021  Component Date Value Ref Range Status   MRSA, PCR 04/30/2021 NEGATIVE  NEGATIVE Final   Staphylococcus aureus 04/30/2021 NEGATIVE  NEGATIVE Final   Comment: (NOTE) The Xpert SA Assay (FDA approved for NASAL specimens in patients 34 years of age and older), is one component of a comprehensive surveillance program. It is not intended to diagnose infection nor to guide or monitor treatment. Performed at [facility], [place] [address] [place], [place] [address]    Hgb A1c MFr Bld 04/30/2021 5.3  4.8 - 5.6 % Final   Comment: (NOTE) Pre diabetes:          5.7%-6.4%  Diabetes:              >6.4%  Glycemic control for   <7.0% adults with diabetes    Mean Plasma Glucose 04/30/2021 105.41  mg/dL Final   Performed at [facility], [place] [address] [place], [place] [address]   Glucose-Capillary 04/30/2021 100 (H)  70 - 99 mg/dL Final   Glucose reference range applies only to samples taken after fasting for at least 8 hours.  Abstract on 04/28/2021  Component Date Value Ref Range Status   HM Colonoscopy 01/17/2021 See Report (in chart)  See Report (in chart), Patient Reported Final  Office Visit on 04/09/2021  Component Date Value Ref Range Status   WBC 04/09/2021 5.2  3.8 - 10.8 Thousand/uL Final   RBC 04/09/2021 4.74  4.20 - 5.80 Million/uL Final   Hemoglobin 04/09/2021 16.6  13.2 - 17.1 g/dL Final   HCT  04/09/2021 47.9  38.5 - 50.0 % Final   MCV 04/09/2021 101.1 (H)  80.0 - 100.0 fL Final   MCH 04/09/2021 35.0 (H)  27.0 - 33.0 pg Final   MCHC 04/09/2021 34.7  32.0 - 36.0 g/dL Final   RDW 04/09/2021 12.0  11.0 - 15.0 % Final   Platelets 04/09/2021 262  140 - 400 Thousand/uL Final   MPV 04/09/2021 9.5  7.5 - 12.5 fL Final   Neutro Abs 04/09/2021 2,512  1,500 - 7,800 cells/uL Final   Lymphs Abs 04/09/2021 2,179  850 - 3,900 cells/uL Final   Absolute Monocytes 04/09/2021 380  200 - 950 cells/uL Final   Eosinophils Absolute 04/09/2021 88  15 - 500 cells/uL Final   Basophils Absolute  04/09/2021 42  0 - 200 cells/uL Final   Neutrophils Relative % 04/09/2021 48.3  % Final   Total Lymphocyte 04/09/2021 41.9  % Final   Monocytes Relative 04/09/2021 7.3  % Final   Eosinophils Relative 04/09/2021 1.7  % Final   Basophils Relative 04/09/2021 0.8  % Final   Glucose, Bld 04/09/2021 128 (H)  65 - 99 mg/dL Final   Comment: .            Fasting reference interval . For someone without known diabetes, a glucose value >125 mg/dL indicates that they may have diabetes and this should be confirmed with a follow-up test. .    BUN 04/09/2021 12  7 - 25 mg/dL Final   Creat 04/09/2021 1.09  0.70 - 1.28 mg/dL Final   eGFR 04/09/2021 73  > OR = 60 mL/min/1.37m Final   Comment: The eGFR is based on the CKD-EPI 2021 equation. To calculate  the new eGFR from a previous Creatinine or Cystatin C result, go to https://www.kidney.org/professionals/ kdoqi/gfr%5Fcalculator    BUN/Creatinine Ratio 136/64/4034NOT APPLICABLE  6 - 22 (calc) Final   Sodium 04/09/2021 139  135 - 146 mmol/L Final   Potassium 04/09/2021 4.2  3.5 - 5.3 mmol/L Final   Chloride 04/09/2021 103  98 - 110 mmol/L Final   CO2 04/09/2021 27  20 - 32 mmol/L Final   Calcium 04/09/2021 9.6  8.6 - 10.3 mg/dL Final   Total Protein 04/09/2021 6.7  6.1 - 8.1 g/dL Final   Albumin 04/09/2021 4.2  3.6 - 5.1 g/dL Final   Globulin 04/09/2021 2.5  1.9 -  3.7 g/dL (calc) Final   AG Ratio 04/09/2021 1.7  1.0 - 2.5 (calc) Final   Total Bilirubin 04/09/2021 0.8  0.2 - 1.2 mg/dL Final   Alkaline phosphatase (APISO) 04/09/2021 88  35 - 144 U/L Final   AST 04/09/2021 22  10 - 35 U/L Final   ALT 04/09/2021 27  9 - 46 U/L Final   INR 04/09/2021 1.1   Final   Comment: Reference Range                     0.9-1.1 Moderate-intensity Warfarin Therapy 2.0-3.0 Higher-intensity Warfarin Therapy   3.0-4.0  .    Prothrombin Time 04/09/2021 10.8  9.0 - 11.5 sec Final   Comment: For additional information, please refer to http://education.questdiagnostics.com/faq/FAQ104 (This link is being provided for informational/ educational purposes only.)   Abstract on 04/02/2021  Component Date Value Ref Range Status   HM Colonoscopy 03/13/2021 See Report (in chart)  See Report (in chart), Patient Reported Final     X-Rays:No results found.  EKG: Orders placed or performed in visit on 01/29/21   EKG 12-Lead     Hospital Course: Eric BASALDUAis a 70y.o. who [facility] They were brought to the operating room on 05/05/2021 and underwent Procedure(s): TOTAL KNEE ARTHROPLASTY.  Patient tolerated the procedure well and was later transferred to the recovery room and then to the orthopaedic floor for postoperative care. They were given PO and IV analgesics for pain control following their surgery. They were given 24 hours of postoperative antibiotics of  Anti-infectives (From admission, onward)    Start     Dose/Rate Route Frequency Ordered Stop   05/05/21 1530  ceFAZolin (ANCEF) IVPB 2g/100 mL premix        2 g 200 mL/hr over 30 Minutes Intravenous Every 6 hours 05/05/21 1240 05/05/21  2224   05/05/21 0700  ceFAZolin (ANCEF) IVPB 2g/100 mL premix        2 g 200 mL/hr over 30 Minutes Intravenous On call to O.R. 05/05/21 1572 05/05/21 1003      and started on DVT prophylaxis in the form of Xarelto.   PT and OT were ordered for total  joint protocol. Discharge planning consulted to help with postop disposition and equipment needs.  Patient had a good night on the evening of surgery. They started to get up OOB with therapy on POD #0. Pt was seen during rounds and was ready to go home pending progress with therapy. She worked with therapy on POD #1 and was meeting her goals. Pt was discharged to home later that day in stable condition.  Diet: Regular diet Activity: WBAT Follow-up: in 2 weeks Disposition: Home with OPPT Discharged Condition: stable   Discharge Instructions     Call MD / Call 911   Complete by: As directed    If you experience chest pain or shortness of breath, CALL 911 and be transported to the hospital emergency room.  If you develope a fever above 101 F, pus (white drainage) or increased drainage or redness at the wound, or calf pain, call your surgeon's office.   Change dressing   Complete by: As directed    You may remove the bulky bandage (ACE wrap and gauze) two days after surgery. You will have an adhesive waterproof bandage underneath. Leave this in place until your first follow-up appointment.   Constipation Prevention   Complete by: As directed    Drink plenty of fluids.  Prune juice may be helpful.  You may use a stool softener, such as Colace (over the counter) 100 mg twice a day.  Use MiraLax (over the counter) for constipation as needed.   Diet - low sodium heart healthy   Complete by: As directed    Do not put a pillow under the knee. Place it under the heel.   Complete by: As directed    Driving restrictions   Complete by: As directed    No driving for two weeks   Post-operative opioid taper instructions:   Complete by: As directed    POST-OPERATIVE OPIOID TAPER INSTRUCTIONS: It is important to wean off of your opioid medication as soon as possible. If you do not need pain medication after your surgery it is ok to stop day one. Opioids include: Codeine, Hydrocodone(Norco, Vicodin),  Oxycodone(Percocet, oxycontin) and hydromorphone amongst others.  Long term and even short term use of opiods can cause: Increased pain response Dependence Constipation Depression Respiratory depression And more.  Withdrawal symptoms can include Flu like symptoms Nausea, vomiting And more Techniques to manage these symptoms Hydrate well Eat regular healthy meals Stay active Use relaxation techniques(deep breathing, meditating, yoga) Do Not substitute Alcohol to help with tapering If you have been on opioids for less than two weeks and do not have pain than it is ok to stop all together.  Plan to wean off of opioids This plan should start within one week post op of your joint replacement. Maintain the same interval or time between taking each dose and first decrease the dose.  Cut the total daily intake of opioids by one tablet each day Next start to increase the time between doses. The last dose that should be eliminated is the evening dose.      TED hose   Complete by: As directed    Use stockings (  TED hose) for three weeks on both leg(s).  You may remove them at night for sleeping.   Weight bearing as tolerated   Complete by: As directed       Allergies as of 05/06/2021       Reactions   Lipitor [atorvastatin]    Cloudy headed    Lopid [gemfibrozil]    Unknown reaction. Patient doesn't remember taking this medication.   Penicillins    Unknown childhood reaction Tolerated Cephalosporin Date: 11/26/20. Tolerated Cephalosporin Date: 05/06/21.        Medication List     STOP taking these medications    vitamin B-12 250 MCG tablet Commonly known as: CYANOCOBALAMIN   Vitamin D 50 MCG (2000 UT) Caps       TAKE these medications    calcium carbonate 500 MG chewable tablet Commonly known as: TUMS - dosed in mg elemental calcium Chew 1,000 mg by mouth daily as needed for indigestion or heartburn.   citalopram 40 MG tablet Commonly known as: CELEXA Take 1  tablet Daily for Mood   gabapentin 300 MG capsule Commonly known as: NEURONTIN Take a 300 mg capsule three times a day for two weeks following surgery.Then take a 300 mg capsule two times a day for two weeks. Then take a 300 mg capsule once a day for two weeks. Then discontinue.   methocarbamol 500 MG tablet Commonly known as: ROBAXIN Take 1 tablet (500 mg total) by mouth every 6 (six) hours as needed for muscle spasms.   oxyCODONE 5 MG immediate release tablet Commonly known as: Oxy IR/ROXICODONE Take 1-2 tablets (5-10 mg total) by mouth every 6 (six) hours as needed for severe pain or moderate pain. Not to exceed 6 tablets a day.   rivaroxaban 10 MG Tabs tablet Commonly known as: XARELTO Take 1 tablet (10 mg total) by mouth daily with breakfast for 20 days. Then take one 81 mg aspirin once a day for three weeks. Then discontinue aspirin. Notes to patient: Daily in the morning with breakfast; blood clot prevention   rosuvastatin 5 MG tablet Commonly known as: Crestor Take 1 tab three days / week (MWF) for cholesterol and plaque on arteries. Notes to patient: Home regimen               Discharge Care Instructions  (From admission, onward)           Start     Ordered   05/06/21 0000  Weight bearing as tolerated        05/06/21 0806   05/06/21 0000  Change dressing       Comments: You may remove the bulky bandage (ACE wrap and gauze) two days after surgery. You will have an adhesive waterproof bandage underneath. Leave this in place until your first follow-up appointment.   05/06/21 9163            Follow-up Information     Gaynelle Arabian, MD. Go on 05/20/2021.   Specialty: Orthopedic Surgery Why: You are scheduled for a follow up appointment on 05-20-21 at 2:15 pm. Contact information: [address] [place] [address] [phone]                 Signed: Theresa Duty, PA-C Orthopedic Surgery 05/21/2021, 9:42 AM

## 2021-05-22 DIAGNOSIS — M25661 Stiffness of right knee, not elsewhere classified: Secondary | ICD-10-CM | POA: Diagnosis not present

## 2021-05-26 DIAGNOSIS — M25661 Stiffness of right knee, not elsewhere classified: Secondary | ICD-10-CM | POA: Diagnosis not present

## 2021-06-02 DIAGNOSIS — M25661 Stiffness of right knee, not elsewhere classified: Secondary | ICD-10-CM | POA: Diagnosis not present

## 2021-06-04 DIAGNOSIS — M25661 Stiffness of right knee, not elsewhere classified: Secondary | ICD-10-CM | POA: Diagnosis not present

## 2021-06-05 DIAGNOSIS — L821 Other seborrheic keratosis: Secondary | ICD-10-CM | POA: Diagnosis not present

## 2021-06-05 DIAGNOSIS — Z85828 Personal history of other malignant neoplasm of skin: Secondary | ICD-10-CM | POA: Diagnosis not present

## 2021-06-05 DIAGNOSIS — L570 Actinic keratosis: Secondary | ICD-10-CM | POA: Diagnosis not present

## 2021-06-09 DIAGNOSIS — M25661 Stiffness of right knee, not elsewhere classified: Secondary | ICD-10-CM | POA: Diagnosis not present

## 2021-06-10 DIAGNOSIS — Z471 Aftercare following joint replacement surgery: Secondary | ICD-10-CM | POA: Diagnosis not present

## 2021-06-10 DIAGNOSIS — Z4789 Encounter for other orthopedic aftercare: Secondary | ICD-10-CM | POA: Diagnosis not present

## 2021-06-10 DIAGNOSIS — Z96652 Presence of left artificial knee joint: Secondary | ICD-10-CM | POA: Diagnosis not present

## 2021-06-11 DIAGNOSIS — M25661 Stiffness of right knee, not elsewhere classified: Secondary | ICD-10-CM | POA: Diagnosis not present

## 2021-06-12 ENCOUNTER — Ambulatory Visit: Payer: PPO | Admitting: Adult Health

## 2021-06-13 DIAGNOSIS — M25661 Stiffness of right knee, not elsewhere classified: Secondary | ICD-10-CM | POA: Diagnosis not present

## 2021-07-16 ENCOUNTER — Ambulatory Visit (INDEPENDENT_AMBULATORY_CARE_PROVIDER_SITE_OTHER): Payer: PPO | Admitting: Adult Health

## 2021-07-16 ENCOUNTER — Other Ambulatory Visit: Payer: Self-pay

## 2021-07-16 ENCOUNTER — Encounter: Payer: Self-pay | Admitting: Adult Health

## 2021-07-16 VITALS — BP 120/72 | HR 81 | Temp 97.7°F | Wt 217.0 lb

## 2021-07-16 DIAGNOSIS — I700 Atherosclerosis of aorta: Secondary | ICD-10-CM

## 2021-07-16 DIAGNOSIS — E538 Deficiency of other specified B group vitamins: Secondary | ICD-10-CM

## 2021-07-16 DIAGNOSIS — E559 Vitamin D deficiency, unspecified: Secondary | ICD-10-CM

## 2021-07-16 DIAGNOSIS — Z85828 Personal history of other malignant neoplasm of skin: Secondary | ICD-10-CM | POA: Diagnosis not present

## 2021-07-16 DIAGNOSIS — Z0001 Encounter for general adult medical examination with abnormal findings: Secondary | ICD-10-CM

## 2021-07-16 DIAGNOSIS — Z79899 Other long term (current) drug therapy: Secondary | ICD-10-CM | POA: Diagnosis not present

## 2021-07-16 DIAGNOSIS — F339 Major depressive disorder, recurrent, unspecified: Secondary | ICD-10-CM

## 2021-07-16 DIAGNOSIS — R7309 Other abnormal glucose: Secondary | ICD-10-CM | POA: Diagnosis not present

## 2021-07-16 DIAGNOSIS — E782 Mixed hyperlipidemia: Secondary | ICD-10-CM

## 2021-07-16 DIAGNOSIS — Z87891 Personal history of nicotine dependence: Secondary | ICD-10-CM

## 2021-07-16 DIAGNOSIS — R0989 Other specified symptoms and signs involving the circulatory and respiratory systems: Secondary | ICD-10-CM

## 2021-07-16 DIAGNOSIS — R6889 Other general symptoms and signs: Secondary | ICD-10-CM

## 2021-07-16 DIAGNOSIS — J439 Emphysema, unspecified: Secondary | ICD-10-CM

## 2021-07-16 DIAGNOSIS — E663 Overweight: Secondary | ICD-10-CM | POA: Diagnosis not present

## 2021-07-16 DIAGNOSIS — Z96651 Presence of right artificial knee joint: Secondary | ICD-10-CM

## 2021-07-16 DIAGNOSIS — N401 Enlarged prostate with lower urinary tract symptoms: Secondary | ICD-10-CM | POA: Diagnosis not present

## 2021-07-16 DIAGNOSIS — N138 Other obstructive and reflux uropathy: Secondary | ICD-10-CM

## 2021-07-16 DIAGNOSIS — Z0000 Encounter for general adult medical examination without abnormal findings: Secondary | ICD-10-CM

## 2021-07-16 DIAGNOSIS — Z86010 Personal history of colonic polyps: Secondary | ICD-10-CM

## 2021-07-16 LAB — LIPID PANEL
Cholesterol: 157 mg/dL (ref <200)
HDL: 43 mg/dL (ref >=40)
LDL Cholesterol (Calc): 84 mg/dL (calc)
Non-HDL Cholesterol (Calc): 114 mg/dL (calc) (ref <130)
Total CHOL/HDL Ratio: 3.7 (calc) (ref <5.0)
Triglycerides: 210 mg/dL — ABNORMAL HIGH (ref <150)

## 2021-07-16 LAB — CBC WITH DIFFERENTIAL/PLATELET
Absolute Monocytes: 377 cells/uL (ref 200–950)
Basophils Absolute: 59 cells/uL (ref 0–200)
Basophils Relative: 0.9 %
Eosinophils Absolute: 130 cells/uL (ref 15–500)
Eosinophils Relative: 2 %
HCT: 48 % (ref 38.5–50.0)
Hemoglobin: 16.5 g/dL (ref 13.2–17.1)
Lymphs Abs: 2223 cells/uL (ref 850–3900)
MCH: 34.4 pg — ABNORMAL HIGH (ref 27.0–33.0)
MCHC: 34.4 g/dL (ref 32.0–36.0)
MCV: 100 fL (ref 80.0–100.0)
MPV: 8.9 fL (ref 7.5–12.5)
Monocytes Relative: 5.8 %
Neutro Abs: 3712 cells/uL (ref 1500–7800)
Neutrophils Relative %: 57.1 %
Platelets: 273 10*3/uL (ref 140–400)
RBC: 4.8 10*6/uL (ref 4.20–5.80)
RDW: 12.4 % (ref 11.0–15.0)
Total Lymphocyte: 34.2 %
WBC: 6.5 10*3/uL (ref 3.8–10.8)

## 2021-07-16 LAB — COMPLETE METABOLIC PANEL WITH GFR
AG Ratio: 1.7 (calc) (ref 1.0–2.5)
ALT: 26 U/L (ref 9–46)
AST: 23 U/L (ref 10–35)
Albumin: 4.5 g/dL (ref 3.6–5.1)
Alkaline phosphatase (APISO): 95 U/L (ref 35–144)
BUN: 13 mg/dL (ref 7–25)
CO2: 30 mmol/L (ref 20–32)
Calcium: 9.9 mg/dL (ref 8.6–10.3)
Chloride: 101 mmol/L (ref 98–110)
Creat: 1.04 mg/dL (ref 0.70–1.28)
Globulin: 2.7 g/dL (calc) (ref 1.9–3.7)
Glucose, Bld: 107 mg/dL — ABNORMAL HIGH (ref 65–99)
Potassium: 4.7 mmol/L (ref 3.5–5.3)
Sodium: 137 mmol/L (ref 135–146)
Total Bilirubin: 0.7 mg/dL (ref 0.2–1.2)
Total Protein: 7.2 g/dL (ref 6.1–8.1)
eGFR: 77 mL/min/{1.73_m2} (ref >=60)

## 2021-07-16 LAB — TSH: TSH: 2.95 mIU/L (ref 0.40–4.50)

## 2021-07-16 MED ORDER — CITALOPRAM HYDROBROMIDE 20 MG PO TABS: 20.0000 mg | ORAL_TABLET | Freq: Every day | ORAL | 3 refills | Status: AC

## 2021-07-16 NOTE — Progress Notes (Signed)
MEDICARE ANNUAL WELLNESS VISIT AND FOLLOW UP Assessment:    Burns was seen today for follow-up and medicare wellness.  Diagnoses and all orders for this visit:  Annual Medicare Wellness Visit Due annually  Health maintenance reviewed Check with insurance about shingrix  Atherosclerosis of aorta ([place]) - CT chest 06/2018 Control blood pressure, cholesterol, glucose, increase exercise.   Emphysema (HCC) Mild per imaging; hx of 50 pack/year smoking history; denies sx; monitor  Labile hypertension Controlled by lifestyle modification at this time Monitor blood pressure at home; call if consistently over 130/80 Continue DASH diet.   Reminder to go to the ER if any CP, SOB, nausea, dizziness, severe HA, changes vision/speech, left arm numbness and tingling and jaw pain.  Vitamin D deficiency Continue supplementation Check vitamin D level annually/PRN  Other abnormal glucose Recent A1Cs at goal Discussed diet/exercise, weight management  Defer A1C; check CMP -     COMPLETE METABOLIC PANEL WITH GFR  Medication management -     COMPLETE METABOLIC PANEL WITH GFR -     CBC with Differential/Platelet  Mixed hyperlipidemia Doing well with rosuvastatin Continue low cholesterol diet and exercise.  Check lipid panel.  -     Lipid panel -     TSH  Major depression, recurrent, controlled ([place]) Doing well, try celexa 20 mg  Lifestyle discussed: diet/exerise, sleep hygiene, stress management, hydration  Overweight - BMI 29 Long discussion about weight loss, diet, and exercise Recommended diet heavy in fruits and veggies and low in animal meats, cheeses, and dairy products, appropriate calorie intake Patient will work on reducing snacking/portion control, reducing alcohol intake Discussed appropriate weight for height (below 195lb) and initial goal (<215lb) Follow up at next visit  Excess alcohol intake  Has reduced and doing well   S/p bil knee replacements S/p bil TKA by Dr.  Wynelle Link   Personal history of basal cell cancer Follows with dermatology   History of smoking greater than 50 pack years Quit in 2006; had normal CT chest 06/2018, has been longer than 15 years since cessation, no longer meeting criteria for CT screening Denies concerning sx; monitor   History of colon polyps Hx of + cologuard in 2022, 5 polyps resected by Dr. Earlean Shawl 02/2021, 2 year recall. High fiber diet and avoid processed meats.    Over 30 minutes of exam, counseling, chart review, and critical decision making was performed  Future Appointments  Date Time Provider [place]  01/29/2022  3:00 PM Unk Pinto, MD [facility] None  07/15/2022 11:00 AM Liane Comber, NP [facility] None     Plan:   During the course of the visit the patient was educated and counseled about appropriate screening and preventive services including:   Pneumococcal vaccine  Influenza vaccine Prevnar 13 Td vaccine Screening electrocardiogram Colorectal cancer screening Diabetes screening Glaucoma screening Nutrition counseling    Subjective:  Eric [name] is a 71 y.o. male who presents for Medicare Annual Wellness Visit and 6 month follow up for HTN, hyperlipidemia, glucose management, and vitamin D Def.   Also needing surgical clearance for L TKA, planning 11/25/2020 by Dr. Wynelle Link, had R replaced 05/07/2021, has some occasional residual pain on R, hasn't quite recovered full extension, has follow up this Friday.   He has hx of recurrent depression controlled on celexa 40 mg daily, receptive to reducing dose. 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   He had basal cell cancer removed from  R ear and nose, following up with dermatology.   He had + cologuard, had colonoscopy 03/13/2021 with 5 polyps resected by Dr. Earlean Shawl, recommended 2 year recall.   BMI is Body mass index is 29.43 kg/m., he has been working on diet and exercise,  but admits he snacks a lot. He walks and does strength training 1 hour 4 days a week. He would like to lose some weight, would like to get down 195 lb. He has reduced alcohol from 3/night to just occasional, 1-2 per week.  Wt Readings from Last 3 Encounters:  07/16/21 217 lb (98.4 kg)  05/05/21 212 lb 15.4 oz (96.6 kg)  04/30/21 213 lb (96.6 kg)   He has 50 pack year smoking history; quit in 2006; mild emphesematous changes per imaging; denies sx He has aortic atherosclerosis and coronary artery calcifications per imaging - CT chest 06/2018 did not find any nodules recommended for further follow up  His blood pressure has been controlled at home, today their BP is BP: 120/72 He does workout. He denies chest pain, shortness of breath, dizziness.   He is on cholesterol medication (rosuvastatin 5 mg three days a week) and denies myalgias. His cholesterol is at goal. The cholesterol last visit was:   Lab Results  Component Value Date   CHOL 167 01/29/2021   HDL 41 01/29/2021   LDLCALC 93 01/29/2021   TRIG 240 (H) 01/29/2021   CHOLHDL 4.1 01/29/2021   He has been working on diet and exercise for glucose management, and denies increased appetite, nausea, paresthesia of the feet, polydipsia, polyuria and visual disturbances. Last A1C in the office was:  Lab Results  Component Value Date   HGBA1C 5.3 04/30/2021   Last GFR Lab Results  Component Value Date   GFRNONAA >60 05/06/2021    Patient is on Vitamin D supplement.   Lab Results  Component Value Date   VD25OH 54 01/29/2021      Medication Review:   Current Outpatient Medications (Cardiovascular):    rosuvastatin (CRESTOR) 5 MG tablet, Take 1 tab three days / week (MWF) for cholesterol and plaque on arteries.     Current Outpatient Medications (Other):    calcium carbonate (TUMS - DOSED IN MG ELEMENTAL CALCIUM) 500 MG chewable tablet, Chew 1,000 mg by mouth daily as needed for indigestion or heartburn.   citalopram (CELEXA)  20 MG tablet, Take 1 tablet (20 mg total) by mouth daily.   VITAMIN D, CHOLECALCIFEROL, PO, Take 10,000 Units by mouth daily.   zinc gluconate 50 MG tablet, Take 50 mg by mouth daily.  Allergies: Allergies  Allergen Reactions   Lipitor [Atorvastatin]     Cloudy headed    Lopid [Gemfibrozil]     Unknown reaction. Patient doesn't remember taking this medication.   Penicillins     Unknown childhood reaction  Tolerated Cephalosporin Date: 11/26/20.  Tolerated Cephalosporin Date: 05/06/21.      Current Problems (verified) has Labile hypertension; Abnormal glucose; Hyperlipidemia, mixed; Vitamin D deficiency; Major depression, recurrent, chronic ([place]); Medication management; Overweight (BMI 25.0-29.9); History of smoking greater than 50 pack years (quit 2006); Atherosclerosis of aorta ([place]) by Chest CT scan in 06/2018; Emphysema of lung [facility]); BPH with obstruction/lower urinary tract symptoms; History of basal cell cancer; B12 deficiency; History of colon polyps; and S/P total knee replacement, right on their problem list.  Screening Tests Immunization History  Administered Date(s) Administered   Influenza Split 04/13/2013, 04/06/2014, 04/15/2015   Influenza, High Dose Seasonal PF 04/23/2016, 05/10/2018,  04/23/2019, 04/16/2020, 04/07/2021   Moderna SARS-COV2 Booster Vaccination 09/30/2020   Moderna Sars-Covid-2 Vaccination 08/04/2019, 09/01/2019, 04/16/2020   Pneumococcal Conjugate-13 10/25/2015   Pneumococcal Polysaccharide-23 12/25/2015   Pneumococcal-Unspecified 06/22/2002   Tdap 09/17/2011, 05/10/2018   Cologuard + 2022, colonoscopy 02/2021, 2 year recall Dr. Earlean Shawl  Names of Other Physician/Practitioners you currently use: 1. [facility] here for primary care 2. [facility], eye doctor, last visit 2021, post cataracts, doing well 3. Dr. Johnnye Sima, dentist, last visit 2020, has full dentures 4. Dr. Joyice Faster, dermatology, last  05/2021, goes annually, basal cell history   Patient Care Team: Unk Pinto, MD as PCP - General (Internal Medicine) Richmond Campbell, MD as Consulting Physician (Gastroenterology)  Surgical: He  has a past surgical history that includes Knee arthroscopy (Left, 1988); Cataract extraction, bilateral (Bilateral, 2019); dental implant surgery ; Total knee arthroplasty (Left, 11/25/2020); Skin cancer excision; and Total knee arthroplasty (Right, 05/05/2021). Family His family history includes Alzheimer's disease in his mother; Colon polyps in his father; Diabetes in his mother; Heart disease in his father; Hypertension in his father; Stroke in his father. Social history  He reports that he quit smoking about 16 years ago. His smoking use included cigarettes. He started smoking about 53 years ago. He has a 55.50 pack-year smoking history. He has never used smokeless tobacco. He reports current alcohol use of about 2.0 - 3.0 standard drinks per week. He reports that he does not use drugs.  MEDICARE WELLNESS OBJECTIVES: Physical activity: Current Exercise Habits: Home exercise routine, Type of exercise: walking;treadmill;strength training/weights, Time (Minutes): 60, Frequency (Times/Week): 2, Weekly Exercise (Minutes/Week): 120, Intensity: Mild, Exercise limited by: orthopedic condition(s) Cardiac risk factors: Cardiac Risk Factors include: advanced age (>22men, >62 women);dyslipidemia;hypertension;smoking/ tobacco exposure Depression/mood screen:   Depression screen [facility] 2/9 07/16/2021  Decreased Interest 0  Down, Depressed, Hopeless 0  PHQ - 2 Score 0  Altered sleeping -  Tired, decreased energy -  Change in appetite -  Feeling bad or failure about yourself  -  Trouble concentrating -  Moving slowly or fidgety/restless -  Suicidal thoughts -  PHQ-9 Score -  Difficult doing work/chores -    ADLs:  In your present state of health, do you have any difficulty performing the following  activities: 07/16/2021 05/05/2021  Hearing? N N  Vision? N N  Difficulty concentrating or making decisions? N N  Walking or climbing stairs? N N  Dressing or bathing? N N  Doing errands, shopping? N N  Some recent data might be hidden     Cognitive Testing  Alert? Yes  Normal Appearance?Yes  Oriented to person? Yes  Place? Yes   Time? Yes  Recall of three objects?  Yes  Can perform simple calculations? Yes  Displays appropriate judgment?Yes  Can read the correct time from a watch face?Yes  EOL planning: Does Patient Have a Medical Advance Directive?: Yes Type of Advance Directive: Healthcare Power of Attorney, Living will Does patient want to make changes to medical advance directive?: No - Patient declined Copy of [place] in Chart?: Yes - validated most recent copy scanned in chart (See row information)   Objective:   Today's Vitals   07/16/21 1054  BP: 120/72  Pulse: 81  Temp: 97.7 F (36.5 C)  SpO2: 99%  Weight: 217 lb (98.4 kg)    Body mass index is 29.43 kg/m.  General appearance: alert, no distress, WD/WN, male HEENT: normocephalic, sclerae anicteric, TMs pearly; mask  in place - oral exam deferred Neck: supple, no lymphadenopathy, no thyromegaly, no masses Heart: RRR, normal S1, S2, no murmurs Lungs: CTA bilaterally, no wheezes, rhonchi, or rales Abdomen: +bs, soft, non tender, non distended, no masses, no hepatomegaly, no splenomegaly Musculoskeletal: nontender, no swelling, some bil bony enlargement knees, bil crepitus, R mild valgus deformity  Extremities: no edema, no cyanosis, no clubbing  Pulses: 2+ symmetric, upper and lower extremities, normal cap refill Neurological: alert, oriented x 3, CN2-12 intact, strength normal upper extremities and lower extremities, sensation normal throughout, DTRs 2+ throughout, no cerebellar signs, gait normal Psychiatric: normal affect, behavior normal, pleasant   Medicare Attestation I have  personally reviewed: The patient's medical and social history Their use of alcohol, tobacco or illicit drugs Their current medications and supplements The patient's functional ability including ADLs,fall risks, home safety risks, cognitive, and hearing and visual impairment Diet and physical activities Evidence for depression or mood disorders  The patient's weight, height, BMI, and visual acuity have been recorded in the chart.  I have made referrals, counseling, and provided education to the patient based on review of the above and I have provided the patient with a written personalized care plan for preventive services.     Eric Ribas, NP   07/16/2021

## 2021-08-14 ENCOUNTER — Telehealth: Payer: Self-pay | Admitting: Internal Medicine

## 2021-08-14 NOTE — Chronic Care Management (AMB) (Signed)
°  Chronic Care Management   Note  08/14/2021 Name: NAPHTALI ZYWICKI MRN: [id number] DOB: [birth year]  SATVIK PARCO is a 71 y.o. year old male who is a primary care patient of Unk Pinto, MD. I reached out to Ivar Drape by phone today in response to a referral sent by Mr. Armando Gang PCP, Unk Pinto, MD.   Mr. Dome was given information about Chronic Care Management services today including:  CCM service includes personalized support from designated clinical staff supervised by his physician, including individualized plan of care and coordination with other care providers 24/7 contact phone numbers for assistance for urgent and routine care needs. Service will only be billed when office clinical staff spend 20 minutes or more in a month to coordinate care. Only one practitioner may furnish and bill the service in a calendar month. The patient may stop CCM services at any time (effective at the end of the month) by phone call to the office staff.   Patient agreed to services and verbal consent obtained.   Follow up plan:   Tatjana Secretary/administrator

## 2021-09-12 ENCOUNTER — Telehealth: Payer: Self-pay

## 2021-09-12 NOTE — Telephone Encounter (Signed)
LM-09/12/21-Called patient at [phone] and r/s visit to 8/15 at 1:30PM via phone call. Pt. was scheduled for OV but prefers a phone visit now. ? ?Total time spent: 4 min ?

## 2021-09-16 ENCOUNTER — Ambulatory Visit: Payer: PPO | Admitting: Pharmacist

## 2021-10-27 ENCOUNTER — Encounter: Payer: Self-pay | Admitting: Internal Medicine

## 2021-11-25 ENCOUNTER — Other Ambulatory Visit: Payer: Self-pay | Admitting: Internal Medicine

## 2021-11-25 DIAGNOSIS — E782 Mixed hyperlipidemia: Secondary | ICD-10-CM

## 2021-12-01 ENCOUNTER — Telehealth: Payer: Self-pay | Admitting: Nurse Practitioner

## 2021-12-01 NOTE — Telephone Encounter (Signed)
Patient is requesting a refill on Rosuvastatin to [facility] on [address]

## 2021-12-02 ENCOUNTER — Other Ambulatory Visit: Payer: Self-pay

## 2021-12-02 DIAGNOSIS — E782 Mixed hyperlipidemia: Secondary | ICD-10-CM

## 2021-12-02 MED ORDER — ROSUVASTATIN CALCIUM 5 MG PO TABS: ORAL_TABLET | ORAL | 3 refills | Status: AC

## 2021-12-02 NOTE — Telephone Encounter (Signed)
Absolutely I would like him to take daily.  Change script to 1 tab daily please

## 2022-01-28 NOTE — Patient Instructions (Signed)

## 2022-01-28 NOTE — Progress Notes (Unsigned)
Annual  Screening/Preventative Visit  & Comprehensive Evaluation & Examination  Future Appointments  Date Time Provider Department  01/29/2022                       cpe  3:00 PM Unk Pinto, MD [facility]  02/03/2022  1:30 PM Carleene Mains, [facility]  07/15/2022                      wellness 11:00 AM Darrol Jump, NP [facility]  02/02/2023                       cpe  2:00 PM Unk Pinto, MD [facility]             This very nice 71 y.o. Eric [name] presents for a Screening /Preventative Visit & comprehensive evaluation and management of multiple medical co-morbidities.  Patient has been followed for labile HTN, HLD, Prediabetes and Vitamin D Deficiency. Patient has COPD/Emphysema consequent 50+ pack year smoking . Chest CT scan in Jan 2020 showed Aortic Atherosclerosis.        Patient is followed expectantly predates circa 2002 for labile HTN. Patient's BP has been controlled at home.  Today's BP is                                      . Patient denies any cardiac symptoms as chest pain, palpitations, shortness of breath, dizziness or ankle swelling.       Patient's hyperlipidemia was controlled with diet . Last lipids were  at goal except elevated Trig's :  Lab Results  Component Value Date   CHOL 157 07/16/2021   HDL 43 07/16/2021   LDLCALC 84 07/16/2021   TRIG 210 (H) 07/16/2021   CHOLHDL 3.7 07/16/2021         Patient has hx/o prediabetes (A1c 5.8% /2013) and patient denies reactive hypoglycemic symptoms, visual blurring, diabetic polys or paresthesias. Last A1c was normal & at goal :   Lab Results  Component Value Date   HGBA1C 5.3 04/30/2021          Finally, patient has history of Vitamin D Deficiency ("27" /2008) and last vitamin D was at goal:   Lab Results  Component Value Date   VD25OH 60 01/30/2020     Current Outpatient Medications on File Prior to Visit  Medication Sig   acetaminophen 500 MG tablet Take 1,000 mg  every 6 hours as needed     TUMS 500 MG tablet Chew 1,000 mg daily as needed for indigestion    Allergies  Allergen Reactions   Lipitor [Atorvastatin] Cloudy headed    Lopid [Gemfibrozil] Unknown reaction    Penicillins Unknown childhood reaction    Tolerated Cephalosporin Date: 11/26/20.     Past Medical History:  Diagnosis Date   Arteriosclerosis of aorta ([place])    Arthritis    Basal cell carcinoma    GERD (gastroesophageal reflux disease)    Hyperlipidemia    Labile hypertension    pt denies    Other testicular hypofunction    Prediabetes    ( A1c 5.8% / 2013 )    Vitamin D deficiency      Health Maintenance  Topic Date Due   Zoster Vaccines- Shingrix (1 of 2) Never done   COVID-19 Vaccine- Booster for Moderna  07/17/2020   Fecal DNA (  Cologuard)  12/27/2020   INFLUENZA VACCINE  01/20/2021   TETANUS/TDAP  05/10/2028   PNA vac Low Risk Adult  Completed   HPV VACCINES  Aged Out   Hepatitis C Screening  Discontinued     Immunization History  Administered Date(s) Administered   Influenza Split 04/13/2013, 04/06/2014, 04/15/2015   Influenza, High Dose  05/10/2018, 04/23/2019, 04/16/2020   Moderna Sars-Covid-2 Vacc 08/04/2019, 09/01/2019, 04/16/2020   Pneumococcal 13 10/25/2015   Pneumococcal 23 12/25/2015   Pneumococcal-23 06/22/2002   Tdap 09/17/2011, 05/10/2018    Last Colon - 03/13/2021 - Dr Earlean Shawl - recc f/u Colon in 2 years    Cologard  -  12/27/2017 - Negative - recc 3 yr f/u due July   2022 Cologard  -  02/05/2021 - Positive - prompted Colonoscopy above  Past Surgical History:  Procedure Laterality Date   CATARACT EXTRACTION, BILATERAL Bilateral 2019   Dr. Carmell Austria   dental implant surgery      KNEE ARTHROSCOPY Left 1988   TOTAL KNEE ARTHROPLASTY Left 11/25/2020   TOTAL KNEE ARTHROPLASTY;  Gaynelle Arabian, MD     Family History  Problem Relation Age of Onset   Diabetes Mother    Alzheimer's disease Mother    Heart disease Father    Hypertension Father    Stroke  Father    Colon polyps Father     Social History   Socioeconomic History   Marital status: Married    Spouse name: Eric Eric [name]   Number of children: 1 daughter  Occupational History   Retired Research officer, political party  Tobacco Use   Smoking status: Former    Packs/day: 1.50    Years: 37.00    Pack years: Eric.50    Types: Cigarettes    Start date: 06/08/1968    Quit date: 10/10/2004    Years since quitting: 16.3   Smokeless tobacco: Never  Vaping Use   Vaping Use: Never used  Substance and Sexual Activity   Alcohol use: Yes    Alcohol/week: 0.0 standard drinks    Comment: 5 drinks per week    Drug use: No   Sexual activity: active    ROS Constitutional: Denies fever, chills, weight loss/gain, headaches, insomnia,  night sweats or change in appetite. Does c/o fatigue. Eyes: Denies redness, blurred vision, diplopia, discharge, itchy or watery eyes.  ENT: Denies discharge, congestion, post nasal drip, epistaxis, sore throat, earache, hearing loss, dental pain, Tinnitus, Vertigo, Sinus pain or snoring.  Cardio: Denies chest pain, palpitations, irregular heartbeat, syncope, dyspnea, diaphoresis, orthopnea, PND, claudication or edema Respiratory: denies cough, dyspnea, DOE, pleurisy, hoarseness, laryngitis or wheezing.  Gastrointestinal: Denies dysphagia, heartburn, reflux, water brash, pain, cramps, nausea, vomiting, bloating, diarrhea, constipation, hematemesis, melena, hematochezia, jaundice or hemorrhoids Genitourinary: Denies dysuria, frequency, urgency, nocturia, hesitancy, discharge, hematuria or flank pain Musculoskeletal: Denies arthralgia, myalgia, stiffness, Jt. Swelling, pain, limp or strain/sprain. Denies Falls. Skin: Denies puritis, rash, hives, warts, acne, eczema or change in skin lesion Neuro: No weakness, tremor, incoordination, spasms, paresthesia or pain Psychiatric: Denies confusion, memory loss or sensory loss. Denies Depression. Endocrine: Denies change in weight,  skin, hair change, nocturia, and paresthesia, diabetic polys, visual blurring or hyper / hypo glycemic episodes.  Heme/Lymph: No excessive bleeding, bruising or enlarged lymph nodes.   Physical Exam  There were no vitals taken for this visit.  General Appearance: Well nourished and well groomed and in no apparent distress.  Eyes: PERRLA, EOMs, conjunctiva no swelling or erythema, normal fundi and vessels. Sinuses: No  frontal/maxillary tenderness ENT/Mouth: EACs patent / TMs  nl. Nares clear without erythema, swelling, mucoid exudates. Oral hygiene is good. No erythema, swelling, or exudate. Tongue normal, non-obstructing. Tonsils not swollen or erythematous. Hearing normal.  Neck: Supple, thyroid not palpable. No bruits, nodes or JVD. Respiratory: Respiratory effort normal.  BS equal and clear bilateral without rales, rhonci, wheezing or stridor. Cardio: Heart sounds are normal with regular rate and rhythm and no murmurs, rubs or gallops. Peripheral pulses are normal and equal bilaterally without edema. No aortic or femoral bruits. Chest: symmetric with normal excursions and percussion.  Abdomen: Soft, with Nl bowel sounds. Nontender, no guarding, rebound, hernias, masses, or organomegaly.  Lymphatics: Non tender without lymphadenopathy.  Musculoskeletal: Full ROM all peripheral extremities, joint stability, 5/5 strength, and normal gait. Skin: Warm and dry without rashes, lesions, cyanosis, clubbing or  ecchymosis.  Neuro: Cranial nerves intact, reflexes equal bilaterally. Normal muscle tone, no cerebellar symptoms. Sensation intact.  Pysch: Alert and oriented X 3 with normal affect, insight and judgment appropriate.   Assessment and Plan  1. Annual Preventative/Screening Exam    2. Labile hypertension  - EKG 12-Lead - [place], RETROPERITNL ABD,  LTD - Urinalysis, Routine w reflex microscopic - Microalbumin / creatinine urine ratio - POC Hemoccult Bld/Stl (3-Cd Home Screen); Future -  CBC with Differential/Platelet - COMPLETE METABOLIC PANEL WITH GFR - Magnesium - TSH  3. Hyperlipidemia, mixed  - EKG 12-Lead - [place], RETROPERITNL ABD,  LTD - Lipid panel - TSH  - rosuvastatin (CRESTOR) 5 MG tablet;  Take 1 tab three days / week (MWF)   Disp 39 tablet; Refill: 3  4. Abnormal glucose  - EKG 12-Lead - [place], RETROPERITNL ABD,  LTD - Hemoglobin A1c - Insulin, random  5. Vitamin D deficiency  - VITAMIN D 25 Hydroxy   6. Prediabetes  - EKG 12-Lead - [place], RETROPERITNL ABD,  LTD - Hemoglobin A1c - Insulin, random  7. Atherosclerosis of aorta ([place]) by Chest CT scan in 06/2018  - EKG 12-Lead - [place], RETROPERITNL ABD,  LTD  8. Screening for colorectal cancer  - Cologuard  9. BPH with obstruction/lower urinary tract symptoms  - PSA  10. Prostate cancer screening  - PSA  11. Screening for ischemic heart disease  - EKG 12-Lead  12. FHx: heart disease  - EKG 12-Lead - [place], RETROPERITNL ABD,  LTD  13. Screening for AAA (aortic abdominal aneurysm)  - [place], RETROPERITNL ABD,  LTD  14. Former smoker  - EKG 12-Lead - [place], RETROPERITNL ABD,  LTD  15. Pulmonary emphysema  ([place])   16. Medication management  - Urinalysis, Routine w reflex microscopic - Microalbumin / creatinine urine ratio - CBC with Differential/Platelet - COMPLETE METABOLIC PANEL WITH GFR - Magnesium - Lipid panel - TSH - Hemoglobin A1c - Insulin, random - VITAMIN D 25 Hydroxy            Patient was counseled in prudent diet, weight control to achieve/maintain BMI less than 25, BP monitoring, regular exercise and medications as discussed.  Discussed med effects and SE's. Routine screening labs and tests as requested with regular follow-up as recommended. Over 40 minutes of exam, counseling, chart review and high complex critical decision making was performed   Kirtland Bouchard, MD

## 2022-01-29 ENCOUNTER — Encounter: Payer: Self-pay | Admitting: Internal Medicine

## 2022-01-29 ENCOUNTER — Ambulatory Visit (INDEPENDENT_AMBULATORY_CARE_PROVIDER_SITE_OTHER): Payer: PPO | Admitting: Internal Medicine

## 2022-01-29 VITALS — BP 122/72 | HR 73 | Temp 97.7°F | Resp 16 | Ht 72.0 in | Wt 222.4 lb

## 2022-01-29 DIAGNOSIS — Z8249 Family history of ischemic heart disease and other diseases of the circulatory system: Secondary | ICD-10-CM | POA: Diagnosis not present

## 2022-01-29 DIAGNOSIS — R7309 Other abnormal glucose: Secondary | ICD-10-CM

## 2022-01-29 DIAGNOSIS — I700 Atherosclerosis of aorta: Secondary | ICD-10-CM

## 2022-01-29 DIAGNOSIS — Z136 Encounter for screening for cardiovascular disorders: Secondary | ICD-10-CM | POA: Diagnosis not present

## 2022-01-29 DIAGNOSIS — R0989 Other specified symptoms and signs involving the circulatory and respiratory systems: Secondary | ICD-10-CM | POA: Diagnosis not present

## 2022-01-29 DIAGNOSIS — Z87891 Personal history of nicotine dependence: Secondary | ICD-10-CM

## 2022-01-29 DIAGNOSIS — E782 Mixed hyperlipidemia: Secondary | ICD-10-CM | POA: Diagnosis not present

## 2022-01-29 DIAGNOSIS — Z0001 Encounter for general adult medical examination with abnormal findings: Secondary | ICD-10-CM

## 2022-01-29 DIAGNOSIS — Z125 Encounter for screening for malignant neoplasm of prostate: Secondary | ICD-10-CM | POA: Diagnosis not present

## 2022-01-29 DIAGNOSIS — Z0000 Encounter for general adult medical examination without abnormal findings: Secondary | ICD-10-CM

## 2022-01-29 DIAGNOSIS — J439 Emphysema, unspecified: Secondary | ICD-10-CM

## 2022-01-29 DIAGNOSIS — N138 Other obstructive and reflux uropathy: Secondary | ICD-10-CM

## 2022-01-29 DIAGNOSIS — E559 Vitamin D deficiency, unspecified: Secondary | ICD-10-CM | POA: Diagnosis not present

## 2022-01-29 DIAGNOSIS — N401 Enlarged prostate with lower urinary tract symptoms: Secondary | ICD-10-CM | POA: Diagnosis not present

## 2022-01-29 DIAGNOSIS — Z79899 Other long term (current) drug therapy: Secondary | ICD-10-CM

## 2022-01-29 DIAGNOSIS — N6311 Unspecified lump in the right breast, upper outer quadrant: Secondary | ICD-10-CM

## 2022-01-30 ENCOUNTER — Telehealth: Payer: Self-pay

## 2022-01-30 LAB — CBC WITH DIFFERENTIAL/PLATELET
Absolute Monocytes: 680 cells/uL (ref 200–950)
Basophils Absolute: 53 cells/uL (ref 0–200)
Basophils Relative: 0.8 %
Eosinophils Absolute: 59 cells/uL (ref 15–500)
Eosinophils Relative: 0.9 %
HCT: 47.5 % (ref 38.5–50.0)
Hemoglobin: 16.5 g/dL (ref 13.2–17.1)
Lymphs Abs: 2508 cells/uL (ref 850–3900)
MCH: 34.7 pg — ABNORMAL HIGH (ref 27.0–33.0)
MCHC: 34.7 g/dL (ref 32.0–36.0)
MCV: 99.8 fL (ref 80.0–100.0)
MPV: 9.4 fL (ref 7.5–12.5)
Monocytes Relative: 10.3 %
Neutro Abs: 3300 cells/uL (ref 1500–7800)
Neutrophils Relative %: 50 %
Platelets: 286 10*3/uL (ref 140–400)
RBC: 4.76 10*6/uL (ref 4.20–5.80)
RDW: 11.8 % (ref 11.0–15.0)
Total Lymphocyte: 38 %
WBC: 6.6 10*3/uL (ref 3.8–10.8)

## 2022-01-30 LAB — URINALYSIS, ROUTINE W REFLEX MICROSCOPIC
Bilirubin Urine: NEGATIVE
Glucose, UA: NEGATIVE
Hgb urine dipstick: NEGATIVE
Ketones, ur: NEGATIVE
Leukocytes,Ua: NEGATIVE
Nitrite: NEGATIVE
Protein, ur: NEGATIVE
Specific Gravity, Urine: 1.025 (ref 1.001–1.035)
pH: <=5 (ref 5.0–8.0)

## 2022-01-30 LAB — VITAMIN D 25 HYDROXY (VIT D DEFICIENCY, FRACTURES): Vit D, 25-Hydroxy: 70 ng/mL (ref 30–100)

## 2022-01-30 LAB — MICROALBUMIN / CREATININE URINE RATIO
Creatinine, Urine: 215 mg/dL (ref 20–320)
Microalb Creat Ratio: 2 mcg/mg creat (ref <30)
Microalb, Ur: 0.5 mg/dL

## 2022-01-30 LAB — COMPLETE METABOLIC PANEL WITH GFR
AG Ratio: 2 (calc) (ref 1.0–2.5)
ALT: 27 U/L (ref 9–46)
AST: 19 U/L (ref 10–35)
Albumin: 4.7 g/dL (ref 3.6–5.1)
Alkaline phosphatase (APISO): 85 U/L (ref 35–144)
BUN: 15 mg/dL (ref 7–25)
CO2: 27 mmol/L (ref 20–32)
Calcium: 10 mg/dL (ref 8.6–10.3)
Chloride: 101 mmol/L (ref 98–110)
Creat: 1.18 mg/dL (ref 0.70–1.28)
Globulin: 2.4 g/dL (calc) (ref 1.9–3.7)
Glucose, Bld: 91 mg/dL (ref 65–99)
Potassium: 4.4 mmol/L (ref 3.5–5.3)
Sodium: 139 mmol/L (ref 135–146)
Total Bilirubin: 0.8 mg/dL (ref 0.2–1.2)
Total Protein: 7.1 g/dL (ref 6.1–8.1)
eGFR: 66 mL/min/{1.73_m2} (ref >=60)

## 2022-01-30 LAB — LIPID PANEL
Cholesterol: 118 mg/dL (ref <200)
HDL: 40 mg/dL (ref >=40)
LDL Cholesterol (Calc): 53 mg/dL (calc)
Non-HDL Cholesterol (Calc): 78 mg/dL (calc) (ref <130)
Total CHOL/HDL Ratio: 3 (calc) (ref <5.0)
Triglycerides: 175 mg/dL — ABNORMAL HIGH (ref <150)

## 2022-01-30 LAB — HEMOGLOBIN A1C
Hgb A1c MFr Bld: 5.6 % of total Hgb (ref <5.7)
Mean Plasma Glucose: 114 mg/dL
eAG (mmol/L): 6.3 mmol/L

## 2022-01-30 LAB — MAGNESIUM: Magnesium: 2.2 mg/dL (ref 1.5–2.5)

## 2022-01-30 LAB — TSH: TSH: 1.78 mIU/L (ref 0.40–4.50)

## 2022-01-30 LAB — INSULIN, RANDOM: Insulin: 33.1 u[IU]/mL — ABNORMAL HIGH

## 2022-01-30 LAB — PSA: PSA: 0.27 ng/mL (ref <=4.00)

## 2022-01-30 NOTE — Progress Notes (Signed)
<><><><><><><><><><><><><><><><><><><><><><><><><><><><><><><><><> <><><><><><><><><><><><><><><><><><><><><><><><><><><><><><><><><> -   Test results slightly outside the reference range are not unusual. If there is anything important, I will review this with you,  otherwise it is considered normal test values.  If you have further questions,  please do not hesitate to contact me at the office or via My Chart.  <><><><><><><><><><><><><><><><><><><><><><><><><><><><><><><><><> <><><><><><><><><><><><><><><><><><><><><><><><><><><><><><><><><>  -  Total Chol = 118   &  LDL Chol = 53  - Both  Excellent   - Very low risk for Heart Attack  / Stroke <><><><><><><><><><><><><><><><><><><><><><><><><><><><><><><><><>  -  PSA - Low  - Great ! <><><><><><><><><><><><><><><><><><><><><><><><><><><><><><><><><>  -  A1c - Normal  - No Diabetes  - Great !  <><><><><><><><><><><><><><><><><><><><><><><><><><><><><><><><><>  -  Vitamin D = 70 - Excellent - Please keep dose same  <><><><><><><><><><><><><><><><><><><><><><><><><><><><><><><><><>  -  All Else - CBC - Kidneys - Electrolytes - Liver - Magnesium & Thyroid     <><><><><><><><><><><><><><><><><><><><><><><><><><><><><><><><><> <><><><><><><><><><><><><><><><><><><><><><><><><><><><><><><><><>

## 2022-01-30 NOTE — Telephone Encounter (Signed)
LM-01/30/22-Chart Prep started, Reviewing OV, Consults, Hospital visits, Labs and medication changes.  Chart prep completed. Patient update form completed  Total time spent: 30 min.

## 2022-01-30 NOTE — Telephone Encounter (Signed)
LM-01/30/22-Care plan completed. Called pt. To confirm CP visit for 8/15 at 1:30 *OV*. Unable to reach. Will try calling pt. At another time.  Total time spent: 9 min.

## 2022-02-02 ENCOUNTER — Other Ambulatory Visit: Payer: Self-pay | Admitting: Internal Medicine

## 2022-02-02 NOTE — Addendum Note (Signed)
Addended by: Unk Pinto on: 02/02/2022 12:08 PM   Modules accepted: Orders

## 2022-02-02 NOTE — Telephone Encounter (Signed)
LM-02/02/22-Called pt. to confirm CP initial visit for 8/15 at 1:30PM. Unable to reach pt. Left detailed message regarding appointment and to return the call as soon as possible. Pt. is scheduled for a phone visit, but for initials patients should have an office visit if possible.  Total time spent: 2 min.

## 2022-02-03 ENCOUNTER — Telehealth: Payer: PPO | Admitting: Pharmacy Technician

## 2022-02-12 ENCOUNTER — Ambulatory Visit
Admission: RE | Admit: 2022-02-12 | Discharge: 2022-02-12 | Disposition: A | Discharge status: Home / Self Care | Payer: PPO | Source: Ambulatory Visit | Attending: Internal Medicine | Admitting: Internal Medicine

## 2022-02-12 ENCOUNTER — Ambulatory Visit: Admission: RE | Admit: 2022-02-12 | Payer: PPO | Source: Ambulatory Visit

## 2022-02-12 DIAGNOSIS — N62 Hypertrophy of breast: Secondary | ICD-10-CM | POA: Diagnosis not present

## 2022-02-12 DIAGNOSIS — N6311 Unspecified lump in the right breast, upper outer quadrant: Secondary | ICD-10-CM

## 2022-02-12 NOTE — Telephone Encounter (Signed)
LM-02/12/22-Called pt. To r/s CP intital visit. Pt. Stated he doesn't see the need in having a visit w/ CP at this time because he is not taking many medications. Pt. Agreed for me to reach out in 3 months to try scheduling then.  Total time spent:4 min.

## 2022-02-12 NOTE — Progress Notes (Signed)
<><><><><><><><><><><><><><><><><><><><><><><><><><><><><><><><><> <><><><><><><><><><><><><><><><><><><><><><><><><><><><><><><><><>  -   MGM returned OK - No sign of a cancer   <><><><><><><><><><><><><><><><><><><><><><><><><><><><><><><><><> <><><><><><><><><><><><><><><><><><><><><><><><><><><><><><><><><>

## 2022-03-10 ENCOUNTER — Emergency Department (HOSPITAL_COMMUNITY): Payer: PPO

## 2022-03-10 ENCOUNTER — Other Ambulatory Visit: Payer: Self-pay

## 2022-03-10 ENCOUNTER — Encounter (HOSPITAL_COMMUNITY): Payer: Self-pay | Admitting: General Surgery

## 2022-03-10 ENCOUNTER — Inpatient Hospital Stay (HOSPITAL_COMMUNITY)
Admission: EM | Admit: 2022-03-10 | Discharge: 2022-03-22 | DRG: 871 | Disposition: E | Payer: PPO | Attending: Pulmonary Disease | Admitting: Pulmonary Disease

## 2022-03-10 ENCOUNTER — Inpatient Hospital Stay (HOSPITAL_COMMUNITY): Payer: PPO

## 2022-03-10 DIAGNOSIS — I248 Other forms of acute ischemic heart disease: Secondary | ICD-10-CM | POA: Diagnosis present

## 2022-03-10 DIAGNOSIS — S80212A Abrasion, left knee, initial encounter: Secondary | ICD-10-CM | POA: Diagnosis present

## 2022-03-10 DIAGNOSIS — J439 Emphysema, unspecified: Secondary | ICD-10-CM | POA: Diagnosis present

## 2022-03-10 DIAGNOSIS — Z9842 Cataract extraction status, left eye: Secondary | ICD-10-CM

## 2022-03-10 DIAGNOSIS — K567 Ileus, unspecified: Secondary | ICD-10-CM | POA: Diagnosis present

## 2022-03-10 DIAGNOSIS — R778 Other specified abnormalities of plasma proteins: Secondary | ICD-10-CM | POA: Diagnosis not present

## 2022-03-10 DIAGNOSIS — I6782 Cerebral ischemia: Secondary | ICD-10-CM | POA: Diagnosis not present

## 2022-03-10 DIAGNOSIS — J9621 Acute and chronic respiratory failure with hypoxia: Secondary | ICD-10-CM | POA: Diagnosis not present

## 2022-03-10 DIAGNOSIS — K750 Abscess of liver: Secondary | ICD-10-CM | POA: Diagnosis present

## 2022-03-10 DIAGNOSIS — I10 Essential (primary) hypertension: Secondary | ICD-10-CM | POA: Diagnosis present

## 2022-03-10 DIAGNOSIS — D649 Anemia, unspecified: Secondary | ICD-10-CM | POA: Diagnosis not present

## 2022-03-10 DIAGNOSIS — I480 Paroxysmal atrial fibrillation: Secondary | ICD-10-CM | POA: Diagnosis present

## 2022-03-10 DIAGNOSIS — R0902 Hypoxemia: Secondary | ICD-10-CM | POA: Diagnosis not present

## 2022-03-10 DIAGNOSIS — N3289 Other specified disorders of bladder: Secondary | ICD-10-CM | POA: Diagnosis not present

## 2022-03-10 DIAGNOSIS — R109 Unspecified abdominal pain: Secondary | ICD-10-CM | POA: Diagnosis not present

## 2022-03-10 DIAGNOSIS — Z79899 Other long term (current) drug therapy: Secondary | ICD-10-CM

## 2022-03-10 DIAGNOSIS — K7200 Acute and subacute hepatic failure without coma: Secondary | ICD-10-CM | POA: Diagnosis not present

## 2022-03-10 DIAGNOSIS — K219 Gastro-esophageal reflux disease without esophagitis: Secondary | ICD-10-CM | POA: Diagnosis present

## 2022-03-10 DIAGNOSIS — D65 Disseminated intravascular coagulation [defibrination syndrome]: Secondary | ICD-10-CM

## 2022-03-10 DIAGNOSIS — A419 Sepsis, unspecified organism: Secondary | ICD-10-CM | POA: Diagnosis not present

## 2022-03-10 DIAGNOSIS — N179 Acute kidney failure, unspecified: Secondary | ICD-10-CM

## 2022-03-10 DIAGNOSIS — I4891 Unspecified atrial fibrillation: Secondary | ICD-10-CM | POA: Diagnosis not present

## 2022-03-10 DIAGNOSIS — R001 Bradycardia, unspecified: Secondary | ICD-10-CM | POA: Diagnosis not present

## 2022-03-10 DIAGNOSIS — E161 Other hypoglycemia: Secondary | ICD-10-CM | POA: Diagnosis not present

## 2022-03-10 DIAGNOSIS — Z888 Allergy status to other drugs, medicaments and biological substances status: Secondary | ICD-10-CM

## 2022-03-10 DIAGNOSIS — J9690 Respiratory failure, unspecified, unspecified whether with hypoxia or hypercapnia: Secondary | ICD-10-CM | POA: Diagnosis not present

## 2022-03-10 DIAGNOSIS — I499 Cardiac arrhythmia, unspecified: Secondary | ICD-10-CM | POA: Diagnosis not present

## 2022-03-10 DIAGNOSIS — X58XXXA Exposure to other specified factors, initial encounter: Secondary | ICD-10-CM | POA: Diagnosis present

## 2022-03-10 DIAGNOSIS — N170 Acute kidney failure with tubular necrosis: Secondary | ICD-10-CM

## 2022-03-10 DIAGNOSIS — A4151 Sepsis due to Escherichia coli [E. coli]: Secondary | ICD-10-CM | POA: Diagnosis not present

## 2022-03-10 DIAGNOSIS — F32A Depression, unspecified: Secondary | ICD-10-CM | POA: Diagnosis not present

## 2022-03-10 DIAGNOSIS — K810 Acute cholecystitis: Secondary | ICD-10-CM

## 2022-03-10 DIAGNOSIS — R112 Nausea with vomiting, unspecified: Secondary | ICD-10-CM | POA: Diagnosis not present

## 2022-03-10 DIAGNOSIS — J9811 Atelectasis: Secondary | ICD-10-CM | POA: Diagnosis present

## 2022-03-10 DIAGNOSIS — M1990 Unspecified osteoarthritis, unspecified site: Secondary | ICD-10-CM | POA: Diagnosis present

## 2022-03-10 DIAGNOSIS — Z8249 Family history of ischemic heart disease and other diseases of the circulatory system: Secondary | ICD-10-CM

## 2022-03-10 DIAGNOSIS — M1710 Unilateral primary osteoarthritis, unspecified knee: Secondary | ICD-10-CM | POA: Diagnosis present

## 2022-03-10 DIAGNOSIS — Z4682 Encounter for fitting and adjustment of non-vascular catheter: Secondary | ICD-10-CM | POA: Diagnosis not present

## 2022-03-10 DIAGNOSIS — E876 Hypokalemia: Secondary | ICD-10-CM | POA: Diagnosis present

## 2022-03-10 DIAGNOSIS — E162 Hypoglycemia, unspecified: Secondary | ICD-10-CM | POA: Diagnosis not present

## 2022-03-10 DIAGNOSIS — Z96653 Presence of artificial knee joint, bilateral: Secondary | ICD-10-CM | POA: Diagnosis present

## 2022-03-10 DIAGNOSIS — Z85828 Personal history of other malignant neoplasm of skin: Secondary | ICD-10-CM | POA: Diagnosis not present

## 2022-03-10 DIAGNOSIS — K819 Cholecystitis, unspecified: Secondary | ICD-10-CM

## 2022-03-10 DIAGNOSIS — R34 Anuria and oliguria: Secondary | ICD-10-CM | POA: Diagnosis present

## 2022-03-10 DIAGNOSIS — J984 Other disorders of lung: Secondary | ICD-10-CM | POA: Diagnosis not present

## 2022-03-10 DIAGNOSIS — E291 Testicular hypofunction: Secondary | ICD-10-CM | POA: Diagnosis present

## 2022-03-10 DIAGNOSIS — Z7189 Other specified counseling: Secondary | ICD-10-CM | POA: Diagnosis not present

## 2022-03-10 DIAGNOSIS — K8000 Calculus of gallbladder with acute cholecystitis without obstruction: Secondary | ICD-10-CM | POA: Diagnosis not present

## 2022-03-10 DIAGNOSIS — Z66 Do not resuscitate: Secondary | ICD-10-CM | POA: Diagnosis not present

## 2022-03-10 DIAGNOSIS — R6521 Severe sepsis with septic shock: Secondary | ICD-10-CM

## 2022-03-10 DIAGNOSIS — Z880 Allergy status to penicillin: Secondary | ICD-10-CM

## 2022-03-10 DIAGNOSIS — I700 Atherosclerosis of aorta: Secondary | ICD-10-CM | POA: Diagnosis not present

## 2022-03-10 DIAGNOSIS — J9611 Chronic respiratory failure with hypoxia: Secondary | ICD-10-CM

## 2022-03-10 DIAGNOSIS — Z20822 Contact with and (suspected) exposure to covid-19: Secondary | ICD-10-CM | POA: Diagnosis present

## 2022-03-10 DIAGNOSIS — Z452 Encounter for adjustment and management of vascular access device: Secondary | ICD-10-CM | POA: Diagnosis not present

## 2022-03-10 DIAGNOSIS — R079 Chest pain, unspecified: Secondary | ICD-10-CM | POA: Diagnosis not present

## 2022-03-10 DIAGNOSIS — R1011 Right upper quadrant pain: Secondary | ICD-10-CM | POA: Diagnosis not present

## 2022-03-10 DIAGNOSIS — R7303 Prediabetes: Secondary | ICD-10-CM | POA: Diagnosis present

## 2022-03-10 DIAGNOSIS — K5730 Diverticulosis of large intestine without perforation or abscess without bleeding: Secondary | ICD-10-CM | POA: Diagnosis not present

## 2022-03-10 DIAGNOSIS — Z515 Encounter for palliative care: Secondary | ICD-10-CM

## 2022-03-10 DIAGNOSIS — R918 Other nonspecific abnormal finding of lung field: Secondary | ICD-10-CM | POA: Diagnosis not present

## 2022-03-10 DIAGNOSIS — S0990XA Unspecified injury of head, initial encounter: Secondary | ICD-10-CM | POA: Diagnosis not present

## 2022-03-10 DIAGNOSIS — I959 Hypotension, unspecified: Secondary | ICD-10-CM | POA: Diagnosis not present

## 2022-03-10 DIAGNOSIS — R1084 Generalized abdominal pain: Secondary | ICD-10-CM | POA: Diagnosis not present

## 2022-03-10 DIAGNOSIS — E872 Acidosis, unspecified: Secondary | ICD-10-CM | POA: Diagnosis not present

## 2022-03-10 DIAGNOSIS — E785 Hyperlipidemia, unspecified: Secondary | ICD-10-CM | POA: Diagnosis present

## 2022-03-10 DIAGNOSIS — Z9841 Cataract extraction status, right eye: Secondary | ICD-10-CM

## 2022-03-10 DIAGNOSIS — Z87891 Personal history of nicotine dependence: Secondary | ICD-10-CM

## 2022-03-10 HISTORY — PX: IR PERC CHOLECYSTOSTOMY: IMG2326

## 2022-03-10 LAB — POCT I-STAT 7, (LYTES, BLD GAS, ICA,H+H)
Acid-base deficit: 7 mmol/L — ABNORMAL HIGH (ref 0.0–2.0)
Acid-base deficit: 5 mmol/L — ABNORMAL HIGH (ref 0.0–2.0)
Bicarbonate: 17.6 mmol/L — ABNORMAL LOW (ref 20.0–28.0)
Bicarbonate: 19.5 mmol/L — ABNORMAL LOW (ref 20.0–28.0)
Calcium, Ion: 1.11 mmol/L — ABNORMAL LOW (ref 1.15–1.40)
Calcium, Ion: 1.1 mmol/L — ABNORMAL LOW (ref 1.15–1.40)
HCT: 37 % — ABNORMAL LOW (ref 39.0–52.0)
HCT: 39 % (ref 39.0–52.0)
Hemoglobin: 12.6 g/dL — ABNORMAL LOW (ref 13.0–17.0)
Hemoglobin: 13.3 g/dL (ref 13.0–17.0)
O2 Saturation: 96 %
O2 Saturation: 100 %
Patient temperature: 98.4
Patient temperature: 98.4
Potassium: 3.7 mmol/L (ref 3.5–5.1)
Potassium: 3.8 mmol/L (ref 3.5–5.1)
Sodium: 131 mmol/L — ABNORMAL LOW (ref 135–145)
Sodium: 132 mmol/L — ABNORMAL LOW (ref 135–145)
TCO2: 19 mmol/L — ABNORMAL LOW (ref 22–32)
TCO2: 21 mmol/L — ABNORMAL LOW (ref 22–32)
pCO2 arterial: 31.5 mmHg — ABNORMAL LOW (ref 32–48)
pCO2 arterial: 35.4 mmHg (ref 32–48)
pH, Arterial: 7.355 (ref 7.35–7.45)
pH, Arterial: 7.349 — ABNORMAL LOW (ref 7.35–7.45)
pO2, Arterial: 86 mmHg (ref 83–108)
pO2, Arterial: 316 mmHg — ABNORMAL HIGH (ref 83–108)

## 2022-03-10 LAB — TYPE AND SCREEN
ABO/RH(D): O POS
Antibody Screen: NEGATIVE

## 2022-03-10 LAB — TROPONIN I (HIGH SENSITIVITY)
Troponin I (High Sensitivity): 812 ng/L (ref <18)
Troponin I (High Sensitivity): 886 ng/L (ref <18)
Troponin I (High Sensitivity): 702 ng/L (ref <18)

## 2022-03-10 LAB — COMPREHENSIVE METABOLIC PANEL
ALT: 45 U/L — ABNORMAL HIGH (ref 0–44)
AST: 95 U/L — ABNORMAL HIGH (ref 15–41)
Albumin: 2.2 g/dL — ABNORMAL LOW (ref 3.5–5.0)
Alkaline Phosphatase: 520 U/L — ABNORMAL HIGH (ref 38–126)
Anion gap: 23 — ABNORMAL HIGH (ref 5–15)
BUN: 61 mg/dL — ABNORMAL HIGH (ref 8–23)
CO2: 14 mmol/L — ABNORMAL LOW (ref 22–32)
Calcium: 8.7 mg/dL — ABNORMAL LOW (ref 8.9–10.3)
Chloride: 99 mmol/L (ref 98–111)
Creatinine, Ser: 4.86 mg/dL — ABNORMAL HIGH (ref 0.61–1.24)
GFR, Estimated: 12 mL/min — ABNORMAL LOW (ref >60)
Glucose, Bld: 105 mg/dL — ABNORMAL HIGH (ref 70–99)
Potassium: 2.8 mmol/L — ABNORMAL LOW (ref 3.5–5.1)
Sodium: 136 mmol/L (ref 135–145)
Total Bilirubin: 3.3 mg/dL — ABNORMAL HIGH (ref 0.3–1.2)
Total Protein: 5 g/dL — ABNORMAL LOW (ref 6.5–8.1)

## 2022-03-10 LAB — CBC WITH DIFFERENTIAL/PLATELET
Abs Immature Granulocytes: 0.3 10*3/uL — ABNORMAL HIGH (ref 0.00–0.07)
Basophils Absolute: 0 10*3/uL (ref 0.0–0.1)
Basophils Relative: 0 %
Eosinophils Absolute: 0 10*3/uL (ref 0.0–0.5)
Eosinophils Relative: 0 %
HCT: 42.8 % (ref 39.0–52.0)
Hemoglobin: 14.3 g/dL (ref 13.0–17.0)
Immature Granulocytes: 4 %
Lymphocytes Relative: 10 %
Lymphs Abs: 0.7 10*3/uL (ref 0.7–4.0)
MCH: 34.1 pg — ABNORMAL HIGH (ref 26.0–34.0)
MCHC: 33.4 g/dL (ref 30.0–36.0)
MCV: 102.1 fL — ABNORMAL HIGH (ref 80.0–100.0)
Metamyelocytes Relative: 4 %
Monocytes Absolute: 1.1 10*3/uL — ABNORMAL HIGH (ref 0.1–1.0)
Monocytes Relative: 16 %
Neutro Abs: 4.8 10*3/uL (ref 1.7–7.7)
Neutrophils Relative %: 70 %
Platelets: 30 10*3/uL — ABNORMAL LOW (ref 150–400)
RBC: 4.19 MIL/uL — ABNORMAL LOW (ref 4.22–5.81)
RDW: 13.3 % (ref 11.5–15.5)
WBC: 6.9 10*3/uL (ref 4.0–10.5)
nRBC: 0 % (ref 0.0–0.2)
nRBC: 1 /100 WBC — ABNORMAL HIGH

## 2022-03-10 LAB — CBG MONITORING, ED
Glucose-Capillary: 116 mg/dL — ABNORMAL HIGH (ref 70–99)
Glucose-Capillary: 90 mg/dL (ref 70–99)

## 2022-03-10 LAB — GLUCOSE, CAPILLARY
Glucose-Capillary: 123 mg/dL — ABNORMAL HIGH (ref 70–99)
Glucose-Capillary: 68 mg/dL — ABNORMAL LOW (ref 70–99)
Glucose-Capillary: 76 mg/dL (ref 70–99)
Glucose-Capillary: 89 mg/dL (ref 70–99)

## 2022-03-10 LAB — BASIC METABOLIC PANEL
Anion gap: 16 — ABNORMAL HIGH (ref 5–15)
BUN: 63 mg/dL — ABNORMAL HIGH (ref 8–23)
CO2: 21 mmol/L — ABNORMAL LOW (ref 22–32)
Calcium: 8.4 mg/dL — ABNORMAL LOW (ref 8.9–10.3)
Chloride: 96 mmol/L — ABNORMAL LOW (ref 98–111)
Creatinine, Ser: 4.95 mg/dL — ABNORMAL HIGH (ref 0.61–1.24)
GFR, Estimated: 12 mL/min — ABNORMAL LOW (ref >60)
Glucose, Bld: 121 mg/dL — ABNORMAL HIGH (ref 70–99)
Potassium: 3 mmol/L — ABNORMAL LOW (ref 3.5–5.1)
Sodium: 133 mmol/L — ABNORMAL LOW (ref 135–145)

## 2022-03-10 LAB — LACTIC ACID, PLASMA
Lactic Acid, Venous: >9 mmol/L (ref 0.5–1.9)
Lactic Acid, Venous: 8.9 mmol/L (ref 0.5–1.9)
Lactic Acid, Venous: 5 mmol/L (ref 0.5–1.9)
Lactic Acid, Venous: 4.6 mmol/L (ref 0.5–1.9)

## 2022-03-10 LAB — URINALYSIS, ROUTINE W REFLEX MICROSCOPIC
Bilirubin Urine: NEGATIVE
Glucose, UA: NEGATIVE mg/dL
Ketones, ur: NEGATIVE mg/dL
Leukocytes,Ua: NEGATIVE
Nitrite: NEGATIVE
Protein, ur: 100 mg/dL — AB
Specific Gravity, Urine: 1.017 (ref 1.005–1.030)
pH: 5 (ref 5.0–8.0)

## 2022-03-10 LAB — I-STAT CHEM 8, ED
BUN: 56 mg/dL — ABNORMAL HIGH (ref 8–23)
Calcium, Ion: 1.07 mmol/L — ABNORMAL LOW (ref 1.15–1.40)
Chloride: 101 mmol/L (ref 98–111)
Creatinine, Ser: 5.2 mg/dL — ABNORMAL HIGH (ref 0.61–1.24)
Glucose, Bld: 96 mg/dL (ref 70–99)
HCT: 42 % (ref 39.0–52.0)
Hemoglobin: 14.3 g/dL (ref 13.0–17.0)
Potassium: 2.8 mmol/L — ABNORMAL LOW (ref 3.5–5.1)
Sodium: 134 mmol/L — ABNORMAL LOW (ref 135–145)
TCO2: 15 mmol/L — ABNORMAL LOW (ref 22–32)

## 2022-03-10 LAB — DIC (DISSEMINATED INTRAVASCULAR COAGULATION)PANEL
D-Dimer, Quant: >20 ug/mL-FEU — ABNORMAL HIGH (ref 0.00–0.50)
Fibrinogen: 693 mg/dL — ABNORMAL HIGH (ref 210–475)
INR: 1.7 — ABNORMAL HIGH (ref 0.8–1.2)
Platelets: 25 10*3/uL — CL (ref 150–400)
Prothrombin Time: 20 seconds — ABNORMAL HIGH (ref 11.4–15.2)
Smear Review: NONE SEEN
aPTT: 37 seconds — ABNORMAL HIGH (ref 24–36)

## 2022-03-10 LAB — PROTIME-INR
INR: 1.9 — ABNORMAL HIGH (ref 0.8–1.2)
Prothrombin Time: 21.5 seconds — ABNORMAL HIGH (ref 11.4–15.2)

## 2022-03-10 LAB — RESP PANEL BY RT-PCR (FLU A&B, COVID) ARPGX2
Influenza A by PCR: NEGATIVE
Influenza B by PCR: NEGATIVE
SARS Coronavirus 2 by RT PCR: NEGATIVE

## 2022-03-10 LAB — MAGNESIUM: Magnesium: 1.9 mg/dL (ref 1.7–2.4)

## 2022-03-10 LAB — APTT: aPTT: 42 seconds — ABNORMAL HIGH (ref 24–36)

## 2022-03-10 MED ORDER — LACTATED RINGERS IV BOLUS (SEPSIS)
1000.0000 mL | Freq: Once | INTRAVENOUS | Status: AC
  Administered 2022-03-10: 1000 mL via INTRAVENOUS

## 2022-03-10 MED ORDER — SODIUM CHLORIDE 0.9% FLUSH
10.0000 mL | Freq: Two times a day (BID) | INTRAVENOUS | Status: DC
  Administered 2022-03-10 – 2022-03-12 (×3): 10 mL
  Administered 2022-03-13: 40 mL

## 2022-03-10 MED ORDER — NOREPINEPHRINE 4 MG/250ML-% IV SOLN
2.0000 ug/min | INTRAVENOUS | Status: DC
  Administered 2022-03-10: 2 ug/min via INTRAVENOUS
  Filled 2022-03-10: qty 250

## 2022-03-10 MED ORDER — LEVALBUTEROL HCL 0.63 MG/3ML IN NEBU
0.6300 mg | INHALATION_SOLUTION | Freq: Four times a day (QID) | RESPIRATORY_TRACT | Status: DC | PRN
  Administered 2022-03-10: 0.63 mg via RESPIRATORY_TRACT
  Filled 2022-03-10: qty 3

## 2022-03-10 MED ORDER — SODIUM CHLORIDE 0.9 % IV SOLN: INTRAVENOUS | Status: DC | PRN

## 2022-03-10 MED ORDER — ETOMIDATE 2 MG/ML IV SOLN
INTRAVENOUS | Status: AC
  Administered 2022-03-10: 20 mg via INTRAVENOUS
  Filled 2022-03-10: qty 20

## 2022-03-10 MED ORDER — FENTANYL CITRATE PF 50 MCG/ML IJ SOSY
PREFILLED_SYRINGE | INTRAMUSCULAR | Status: AC
  Administered 2022-03-10: 100 ug via INTRAVENOUS
  Filled 2022-03-10: qty 2

## 2022-03-10 MED ORDER — METRONIDAZOLE 500 MG/100ML IV SOLN
500.0000 mg | Freq: Once | INTRAVENOUS | Status: AC
  Administered 2022-03-10: 500 mg via INTRAVENOUS
  Filled 2022-03-10: qty 100

## 2022-03-10 MED ORDER — ETOMIDATE 2 MG/ML IV SOLN: 20.0000 mg | Freq: Once | INTRAVENOUS | Status: AC

## 2022-03-10 MED ORDER — FENTANYL CITRATE PF 50 MCG/ML IJ SOSY
25.0000 ug | PREFILLED_SYRINGE | INTRAMUSCULAR | Status: DC | PRN
  Administered 2022-03-11 – 2022-03-12 (×4): 50 ug via INTRAVENOUS
  Administered 2022-03-12: 100 ug via INTRAVENOUS
  Administered 2022-03-13 (×2): 50 ug via INTRAVENOUS
  Filled 2022-03-10 (×2): qty 1

## 2022-03-10 MED ORDER — SODIUM CHLORIDE 0.9 % IV SOLN
250.0000 mL | INTRAVENOUS | Status: DC
  Administered 2022-03-10: 250 mL via INTRAVENOUS

## 2022-03-10 MED ORDER — POTASSIUM CHLORIDE 10 MEQ/100ML IV SOLN
10.0000 meq | INTRAVENOUS | Status: AC
  Administered 2022-03-10 (×2): 10 meq via INTRAVENOUS
  Filled 2022-03-10 (×2): qty 100

## 2022-03-10 MED ORDER — SODIUM CHLORIDE 0.9% FLUSH: 10.0000 mL | INTRAVENOUS | Status: DC | PRN

## 2022-03-10 MED ORDER — POTASSIUM CHLORIDE 10 MEQ/100ML IV SOLN: 10.0000 meq | Freq: Once | INTRAVENOUS | Status: DC

## 2022-03-10 MED ORDER — SODIUM CHLORIDE 0.9 % IV SOLN
2.0000 g | INTRAVENOUS | Status: DC
  Administered 2022-03-11: 2 g via INTRAVENOUS
  Filled 2022-03-10: qty 12.5

## 2022-03-10 MED ORDER — FENTANYL CITRATE PF 50 MCG/ML IJ SOSY: 100.0000 ug | PREFILLED_SYRINGE | Freq: Once | INTRAMUSCULAR | Status: AC

## 2022-03-10 MED ORDER — SODIUM BICARBONATE 8.4 % IV SOLN
INTRAVENOUS | Status: DC
  Filled 2022-03-10: qty 1000

## 2022-03-10 MED ORDER — NOREPINEPHRINE 4 MG/250ML-% IV SOLN
0.0000 ug/min | INTRAVENOUS | Status: DC
  Administered 2022-03-10: 30 ug/min via INTRAVENOUS
  Administered 2022-03-11: 10 ug/min via INTRAVENOUS
  Filled 2022-03-10: qty 250

## 2022-03-10 MED ORDER — CHLORHEXIDINE GLUCONATE CLOTH 2 % EX PADS
6.0000 | MEDICATED_PAD | Freq: Every day | CUTANEOUS | Status: DC
  Administered 2022-03-10 – 2022-03-12 (×4): 6 via TOPICAL

## 2022-03-10 MED ORDER — PANTOPRAZOLE SODIUM 40 MG IV SOLR
40.0000 mg | Freq: Every day | INTRAVENOUS | Status: DC
  Administered 2022-03-11 – 2022-03-13 (×3): 40 mg via INTRAVENOUS
  Filled 2022-03-10 (×3): qty 10

## 2022-03-10 MED ORDER — VASOPRESSIN 20 UNITS/100 ML INFUSION FOR SHOCK
0.0000 [IU]/min | INTRAVENOUS | Status: DC
  Administered 2022-03-10 – 2022-03-11 (×2): 0.03 [IU]/min via INTRAVENOUS
  Administered 2022-03-11 – 2022-03-13 (×6): 0.04 [IU]/min via INTRAVENOUS
  Filled 2022-03-10 (×8): qty 100

## 2022-03-10 MED ORDER — METRONIDAZOLE 500 MG/100ML IV SOLN
500.0000 mg | Freq: Two times a day (BID) | INTRAVENOUS | Status: DC
  Administered 2022-03-10 – 2022-03-13 (×6): 500 mg via INTRAVENOUS
  Filled 2022-03-10 (×6): qty 100

## 2022-03-10 MED ORDER — IOHEXOL 350 MG/ML SOLN
100.0000 mL | Freq: Once | INTRAVENOUS | Status: AC | PRN
  Administered 2022-03-10: 80 mL via INTRAVENOUS

## 2022-03-10 MED ORDER — ORAL CARE MOUTH RINSE
15.0000 mL | OROMUCOSAL | Status: DC
  Administered 2022-03-11 – 2022-03-13 (×31): 15 mL via OROMUCOSAL

## 2022-03-10 MED ORDER — POTASSIUM CHLORIDE 10 MEQ/100ML IV SOLN
10.0000 meq | INTRAVENOUS | Status: AC
  Administered 2022-03-10 (×4): 10 meq via INTRAVENOUS
  Filled 2022-03-10 (×4): qty 100

## 2022-03-10 MED ORDER — IOHEXOL 300 MG/ML  SOLN
50.0000 mL | Freq: Once | INTRAMUSCULAR | Status: AC | PRN
  Administered 2022-03-10: 25 mL

## 2022-03-10 MED ORDER — ROCURONIUM BROMIDE 10 MG/ML (PF) SYRINGE
PREFILLED_SYRINGE | INTRAVENOUS | Status: AC
  Administered 2022-03-10: 100 mg via INTRAVENOUS
  Filled 2022-03-10: qty 10

## 2022-03-10 MED ORDER — ROCURONIUM BROMIDE 10 MG/ML (PF) SYRINGE: 100.0000 mg | PREFILLED_SYRINGE | Freq: Once | INTRAVENOUS | Status: AC

## 2022-03-10 MED ORDER — ESMOLOL HCL-SODIUM CHLORIDE 2000 MG/100ML IV SOLN
25.0000 ug/kg/min | INTRAVENOUS | Status: DC
  Administered 2022-03-10: 25 ug/kg/min via INTRAVENOUS
  Filled 2022-03-10: qty 100

## 2022-03-10 MED ORDER — DEXTROSE 50 % IV SOLN
INTRAVENOUS | Status: AC
  Administered 2022-03-10: 12.5 g via INTRAVENOUS
  Filled 2022-03-10: qty 50

## 2022-03-10 MED ORDER — ETOMIDATE 2 MG/ML IV SOLN
INTRAVENOUS | Status: AC | PRN
  Administered 2022-03-10: 10 mg via INTRAVENOUS

## 2022-03-10 MED ORDER — LIDOCAINE HCL 1 % IJ SOLN
INTRAMUSCULAR | Status: AC
  Administered 2022-03-10: 4 mL
  Filled 2022-03-10: qty 20

## 2022-03-10 MED ORDER — PANTOPRAZOLE 2 MG/ML SUSPENSION: 40.0000 mg | Freq: Every day | ORAL | Status: DC

## 2022-03-10 MED ORDER — LIP MEDEX EX OINT: TOPICAL_OINTMENT | CUTANEOUS | Status: DC | PRN

## 2022-03-10 MED ORDER — LACTATED RINGERS IV SOLN: INTRAVENOUS | Status: DC

## 2022-03-10 MED ORDER — MIDAZOLAM HCL 2 MG/2ML IJ SOLN: 2.0000 mg | Freq: Once | INTRAMUSCULAR | Status: AC

## 2022-03-10 MED ORDER — HYDROCODONE-ACETAMINOPHEN 7.5-325 MG PO TABS: 1.0000 | ORAL_TABLET | Freq: Four times a day (QID) | ORAL | Status: DC | PRN

## 2022-03-10 MED ORDER — MIDAZOLAM HCL 2 MG/2ML IJ SOLN
INTRAMUSCULAR | Status: AC
  Filled 2022-03-10: qty 4

## 2022-03-10 MED ORDER — ORAL CARE MOUTH RINSE: 15.0000 mL | OROMUCOSAL | Status: DC | PRN

## 2022-03-10 MED ORDER — NOREPINEPHRINE 4 MG/250ML-% IV SOLN
0.0000 ug/min | INTRAVENOUS | Status: DC
  Administered 2022-03-10: 5 ug/min via INTRAVENOUS
  Filled 2022-03-10: qty 500

## 2022-03-10 MED ORDER — CALCIUM GLUCONATE-NACL 1-0.675 GM/50ML-% IV SOLN
INTRAVENOUS | Status: AC
  Filled 2022-03-10: qty 100

## 2022-03-10 MED ORDER — VANCOMYCIN HCL 2000 MG/400ML IV SOLN
2000.0000 mg | Freq: Once | INTRAVENOUS | Status: AC
  Administered 2022-03-10: 2000 mg via INTRAVENOUS
  Filled 2022-03-10: qty 400

## 2022-03-10 MED ORDER — MIDAZOLAM HCL 2 MG/2ML IJ SOLN
1.0000 mg | INTRAMUSCULAR | Status: DC | PRN
  Administered 2022-03-12 – 2022-03-13 (×3): 1 mg via INTRAVENOUS
  Filled 2022-03-10 (×3): qty 2

## 2022-03-10 MED ORDER — MAGNESIUM SULFATE 2 GM/50ML IV SOLN
2.0000 g | Freq: Once | INTRAVENOUS | Status: AC
  Administered 2022-03-10: 2 g via INTRAVENOUS
  Filled 2022-03-10: qty 50

## 2022-03-10 MED ORDER — ESMOLOL BOLUS VIA INFUSION
250.0000 ug/kg | Freq: Once | INTRAVENOUS | Status: DC
  Filled 2022-03-10: qty 24000

## 2022-03-10 MED ORDER — MIDAZOLAM HCL 2 MG/2ML IJ SOLN
INTRAMUSCULAR | Status: AC | PRN
  Administered 2022-03-10: 1 mg via INTRAVENOUS

## 2022-03-10 MED ORDER — AMIODARONE HCL IN DEXTROSE 360-4.14 MG/200ML-% IV SOLN
30.0000 mg/h | INTRAVENOUS | Status: DC
  Administered 2022-03-11 – 2022-03-12 (×4): 30 mg/h via INTRAVENOUS
  Filled 2022-03-10 (×2): qty 200

## 2022-03-10 MED ORDER — FENTANYL CITRATE PF 50 MCG/ML IJ SOSY
50.0000 ug | PREFILLED_SYRINGE | INTRAMUSCULAR | Status: DC | PRN
  Administered 2022-03-10 (×2): 50 ug via INTRAVENOUS
  Filled 2022-03-10 (×2): qty 1

## 2022-03-10 MED ORDER — LACTATED RINGERS IV BOLUS (SEPSIS)
500.0000 mL | Freq: Once | INTRAVENOUS | Status: AC
  Administered 2022-03-11: 500 mL via INTRAVENOUS

## 2022-03-10 MED ORDER — AMIODARONE HCL IN DEXTROSE 360-4.14 MG/200ML-% IV SOLN
60.0000 mg/h | INTRAVENOUS | Status: DC
  Administered 2022-03-11 (×2): 60 mg/h via INTRAVENOUS
  Filled 2022-03-10 (×3): qty 200

## 2022-03-10 MED ORDER — DEXTROSE 50 % IV SOLN: 12.5000 g | INTRAVENOUS | Status: AC

## 2022-03-10 MED ORDER — AMIODARONE LOAD VIA INFUSION
150.0000 mg | Freq: Once | INTRAVENOUS | Status: AC
  Administered 2022-03-11: 150 mg via INTRAVENOUS
  Filled 2022-03-10: qty 83.34

## 2022-03-10 MED ORDER — SODIUM CHLORIDE 0.9 % IV SOLN
2.0000 g | Freq: Once | INTRAVENOUS | Status: AC
  Administered 2022-03-10: 2 g via INTRAVENOUS
  Filled 2022-03-10: qty 12.5

## 2022-03-10 MED ORDER — MIDAZOLAM HCL 2 MG/2ML IJ SOLN
INTRAMUSCULAR | Status: AC
  Administered 2022-03-10: 2 mg via INTRAVENOUS
  Filled 2022-03-10: qty 2

## 2022-03-10 MED ORDER — PROPOFOL 1000 MG/100ML IV EMUL
0.0000 ug/kg/min | INTRAVENOUS | Status: DC
  Administered 2022-03-10: 5 ug/kg/min via INTRAVENOUS
  Filled 2022-03-10 (×2): qty 100

## 2022-03-10 MED ORDER — FENTANYL CITRATE (PF) 100 MCG/2ML IJ SOLN
INTRAMUSCULAR | Status: AC
  Filled 2022-03-10: qty 2

## 2022-03-10 MED ORDER — VANCOMYCIN VARIABLE DOSE PER UNSTABLE RENAL FUNCTION (PHARMACIST DOSING): Status: DC

## 2022-03-10 MED ORDER — VANCOMYCIN HCL IN DEXTROSE 1-5 GM/200ML-% IV SOLN: 1000.0000 mg | Freq: Once | INTRAVENOUS | Status: DC

## 2022-03-10 MED ORDER — PHENYLEPHRINE HCL-NACL 20-0.9 MG/250ML-% IV SOLN
0.0000 ug/min | INTRAVENOUS | Status: DC
  Administered 2022-03-11: 20 ug/min via INTRAVENOUS
  Administered 2022-03-11: 350 ug/min via INTRAVENOUS
  Administered 2022-03-11: 300 ug/min via INTRAVENOUS
  Filled 2022-03-10 (×5): qty 250

## 2022-03-10 NOTE — Procedures (Signed)
Intubation Procedure Note  Eric [name]  [id number]  11-21-1950  Date:03/07/2022  Time:10:14 PM   Provider Performing:Frederick Marro R Ayven Pheasant    Procedure: Intubation (31500)  Indication(s) Respiratory Failure  Consent Risks of the procedure as well as the alternatives and risks of each were explained to the patient and/or caregiver.  Consent for the procedure was obtained and is signed in the bedside chart   Anesthesia Etomidate, Versed, Fentanyl, and Rocuronium   Time Out Verified patient identification, verified procedure, site/side was marked, verified correct patient position, special equipment/implants available, medications/allergies/relevant history reviewed, required imaging and test results available.   Sterile Technique Usual hand hygeine, masks, and gloves were used   Procedure Description Patient positioned in bed supine.  Sedation given as noted above.  Patient was intubated with endotracheal tube using Glidescope.  View was Grade 1 full glottis .  Number of attempts was 1.  Colorimetric CO2 detector was consistent with tracheal placement.   Complications/Tolerance None; patient tolerated the procedure well. Chest X-ray is ordered to verify placement.   EBL Minimal   Specimen(s) None   Eric Carpen Baylen Buckner, PA-C

## 2022-03-10 NOTE — Consult Note (Signed)
Consult Note  Eric [name] 08-08-1950  [id number].    Requesting MD: Dr. Philip Aspen Chief Complaint/Reason for Consult: cholecystitis   HPI:  71 y.o. male with medical history significant for arthritis, GERD, hyperlipidemia, hypertension, prediabetes, COPD with emphysema, aortic atherosclerosis, per chart as patient states he has no significant medical problems) who presented to [facility] emergency department with abdominal pain.  He began having abdominal pain in his upper abdomen last Thursday.  He developed N/V and eventually some confusion as well.  His pain has persisted and gotten significantly worse.  He thought he had food poisoning.  He denies any fevers or diarrhea.  He has been unable to void for the last day.  He had a foley placed here with only about 100cc of urine output.    Work up in ED is significant for CT scan concerning for cholecystitis with adjacent liver lesion concerning for abscess vs complex neoplasm vs cyst. RUQ [place] pending. He is acutely ill with lactic acid >9 and creatinine acutely elevated to 4.86 (was 1.2, 1 month ago). LFTs are elevated with T bili 3.3. plts 30 and INR 1.9.  WBC remarkably normal.  He has had no prior abdominal surgeries.  He admits to occasional alcohol use (2 glasses of wine per night, hasn't drank in a week).  He is a former cigarette smoker with 55.5-pack-year history, quit 2006.  ROS: see HPI  Family History  Problem Relation Age of Onset   Diabetes Mother    Alzheimer's disease Mother    Heart disease Father    Hypertension Father    Stroke Father    Colon polyps Father     Past Medical History:  Diagnosis Date   Arteriosclerosis of aorta ([place])    Arthritis    Basal cell carcinoma    GERD (gastroesophageal reflux disease)    Hyperlipidemia    Labile hypertension    pt denies    OA (osteoarthritis) of knee 11/25/2020   Other testicular hypofunction    Prediabetes    (A1c 5.8% / 2013)   Vitamin D deficiency      Past Surgical History:  Procedure Laterality Date   CATARACT EXTRACTION, BILATERAL Bilateral 2019   Dr. Carmell Austria   dental implant surgery      KNEE ARTHROSCOPY Left 1988   SKIN CANCER EXCISION     Ear, forehead, nose   TOTAL KNEE ARTHROPLASTY Left 11/25/2020   Procedure: TOTAL KNEE ARTHROPLASTY;  Surgeon: Gaynelle Arabian, MD;  Location: [facility];  Service: Orthopedics;  Laterality: Left;  57mn   TOTAL KNEE ARTHROPLASTY Right 05/05/2021   Procedure: TOTAL KNEE ARTHROPLASTY;  Surgeon: AGaynelle Arabian MD;  Location: [facility];  Service: Orthopedics;  Laterality: Right;    Social History:  reports that he quit smoking about 17 years ago. His smoking use included cigarettes. He started smoking about 53 years ago. He has a 55.50 pack-year smoking history. He has never used smokeless tobacco. He reports current alcohol use of about 2.0 - 3.0 standard drinks of alcohol per week. He reports that he does not use drugs.  Allergies:  Allergies  Allergen Reactions   Lipitor [Atorvastatin]     Cloudy headed    Lopid [Gemfibrozil]     Unknown reaction. Patient doesn't remember taking this medication.   Penicillins     Unknown childhood reaction  Tolerated Cephalosporin Date: 11/26/20.  Tolerated Cephalosporin Date: 05/06/21.      (Not in a hospital admission)  Blood pressure (!) 93/56, pulse (!) 129, resp. rate (!) 38, height 6' (1.829 m), weight 95.3 kg, SpO2 95 %. Physical Exam: General: pleasant, WD, male who appears acutely ill. HEENT: head is normocephalic, atraumatic.  Sclera are noninjected.  Pupils equal and round. EOMs intact.  Ears and nose without any masses or lesions.  Mouth is pink but dry Heart: tachy irregular.  Normal s1,s2. No obvious murmurs, gallops, or rubs noted.  Palpable radial and pedal pulses bilaterally, except RUE has an art line in place Lungs: CTAB, no wheezes, rhonchi, or rales noted.  Respiratory effort somewhat labored with tachypnea.  O2 in  place. Abd: soft, tender in RUQ with some voluntary guarding, hypoactive BS, mild distention, no mass, hernias noted MSK: all 4 extremities are symmetrical with no cyanosis, clubbing, or edema. Skin: warm and dry with no masses, lesions, or rashes Neuro: Cranial nerves 2-12 grossly intact, sensation is normal throughout Psych: A&Ox3 but with an altered affect consistent with some mild confusion at times.   Results for orders placed or performed during the hospital encounter of 03/20/2022 (from the past 48 hour(s))  CBG monitoring, ED     Status: Abnormal   Collection Time: 03/15/2022 11:01 AM  Result Value Ref Range   Glucose-Capillary 116 (H) 70 - 99 mg/dL    Comment: Glucose reference range applies only to samples taken after fasting for at least 8 hours.  Lactic acid, plasma     Status: Abnormal   Collection Time: 03/18/2022 11:04 AM  Result Value Ref Range   Lactic Acid, Venous >9.0 (HH) 0.5 - 1.9 mmol/L    Comment: CRITICAL RESULT CALLED TO, READ BACK BY AND VERIFIED WITH N.WELLINGTON RN 1218 03/18/2022 MCCORMICK K Performed at [facility], [place] [address], [place], [place] [address]   Comprehensive metabolic panel     Status: Abnormal   Collection Time: 02/20/2022 11:04 AM  Result Value Ref Range   Sodium 136 135 - 145 mmol/L   Potassium 2.8 (L) 3.5 - 5.1 mmol/L   Chloride 99 98 - 111 mmol/L   CO2 14 (L) 22 - 32 mmol/L   Glucose, Bld 105 (H) 70 - 99 mg/dL    Comment: Glucose reference range applies only to samples taken after fasting for at least 8 hours.   BUN 61 (H) 8 - 23 mg/dL   Creatinine, Ser 4.86 (H) 0.61 - 1.24 mg/dL   Calcium 8.7 (L) 8.9 - 10.3 mg/dL   Total Protein 5.0 (L) 6.5 - 8.1 g/dL   Albumin 2.2 (L) 3.5 - 5.0 g/dL   AST 95 (H) 15 - 41 U/L   ALT 45 (H) 0 - 44 U/L    Comment: RESULT CONFIRMED BY MANUAL DILUTION   Alkaline Phosphatase 520 (H) 38 - 126 U/L   Total Bilirubin 3.3 (H) 0.3 - 1.2 mg/dL   GFR, Estimated 12 (L) >60 mL/min    Comment: (NOTE) Calculated  using the CKD-EPI Creatinine Equation (2021)    Anion gap 23 (H) 5 - 15    Comment: Electrolytes repeated to confirm. Performed at [facility], [place] [address] [place], [place] [address]   CBC with Differential     Status: Abnormal   Collection Time: 02/22/2022 11:04 AM  Result Value Ref Range   WBC 6.9 4.0 - 10.5 K/uL   RBC 4.19 (L) 4.22 - 5.81 MIL/uL   Hemoglobin 14.3 13.0 - 17.0 g/dL   HCT 42.8 39.0 - 52.0 %   MCV 102.1 (  H) 80.0 - 100.0 fL   MCH 34.1 (H) 26.0 - 34.0 pg   MCHC 33.4 30.0 - 36.0 g/dL   RDW 13.3 11.5 - 15.5 %   Platelets 30 (L) 150 - 400 K/uL    Comment: Immature Platelet Fraction may be clinically indicated, consider ordering this additional test [id number] REPEATED TO VERIFY PLATELET COUNT CONFIRMED BY SMEAR    nRBC 0.0 0.0 - 0.2 %   Neutrophils Relative % 70 %   Neutro Abs 4.8 1.7 - 7.7 K/uL   Lymphocytes Relative 10 %   Lymphs Abs 0.7 0.7 - 4.0 K/uL   Monocytes Relative 16 %   Monocytes Absolute 1.1 (H) 0.1 - 1.0 K/uL   Eosinophils Relative 0 %   Eosinophils Absolute 0.0 0.0 - 0.5 K/uL   Basophils Relative 0 %   Basophils Absolute 0.0 0.0 - 0.1 K/uL   WBC Morphology VACUOLATED NEUTROPHILS     Comment: MILD LEFT SHIFT (1-5% METAS, OCC MYELO, OCC BANDS)   nRBC 1 (H) 0 /100 WBC   Metamyelocytes Relative 4 %   Immature Granulocytes 4 %   Abs Immature Granulocytes 0.30 (H) 0.00 - 0.07 K/uL    Comment: Performed at [facility], [address] [place], [place] [address]  Protime-INR     Status: Abnormal   Collection Time: 02/22/2022 11:04 AM  Result Value Ref Range   Prothrombin Time 21.5 (H) 11.4 - 15.2 seconds   INR 1.9 (H) 0.8 - 1.2    Comment: (NOTE) INR goal varies based on device and disease states. Performed at [facility], [place] [address] [place], [place] [address]   APTT     Status: Abnormal   Collection Time: 03/18/2022 11:04 AM  Result Value Ref Range   aPTT 42 (H) 24 - 36 seconds    Comment:        IF BASELINE aPTT IS  ELEVATED, SUGGEST PATIENT RISK ASSESSMENT BE USED TO DETERMINE APPROPRIATE ANTICOAGULANT THERAPY. Performed at [facility], [place] [address], [place], [place] [address]   Resp Panel by RT-PCR (Flu A&B, Covid) Anterior Nasal Swab     Status: None   Collection Time: 03/16/2022 11:06 AM   Specimen: Anterior Nasal Swab  Result Value Ref Range   SARS Coronavirus 2 by RT PCR NEGATIVE NEGATIVE    Comment: (NOTE) SARS-CoV-2 target nucleic acids are NOT DETECTED.  The SARS-CoV-2 RNA is generally detectable in upper respiratory specimens during the acute phase of infection. The lowest concentration of SARS-CoV-2 viral copies this assay can detect is 138 copies/mL. A negative result does not preclude SARS-Cov-2 infection and should not be used as the sole basis for treatment or other patient management decisions. A negative result may occur with  improper specimen collection/handling, submission of specimen other than nasopharyngeal swab, presence of viral mutation(s) within the areas targeted by this assay, and inadequate number of viral copies(<138 copies/mL). A negative result must be combined with clinical observations, patient history, and epidemiological information. The expected result is Negative.  Fact Sheet for Patients:  EntrepreneurPulse.com.au  Fact Sheet for Healthcare Providers:  IncredibleEmployment.be  This test is no t yet approved or cleared by the [place] FDA and  has been authorized for detection and/or diagnosis of SARS-CoV-2 by FDA under an Emergency Use Authorization (EUA). This EUA will remain  in effect (meaning this test can be used) for the duration of the COVID-19 declaration under Section 564(b)(1) of the Act, 21 U.S.C.section 360bbb-3(b)(1), unless  the authorization is terminated  or revoked sooner.       Influenza A by PCR NEGATIVE NEGATIVE   Influenza B by PCR NEGATIVE NEGATIVE    Comment: (NOTE) The Xpert  Xpress SARS-CoV-2/FLU/RSV plus assay is intended as an aid in the diagnosis of influenza from Nasopharyngeal swab specimens and should not be used as a sole basis for treatment. Nasal washings and aspirates are unacceptable for Xpert Xpress SARS-CoV-2/FLU/RSV testing.  Fact Sheet for Patients: EntrepreneurPulse.com.au  Fact Sheet for Healthcare Providers: IncredibleEmployment.be  This test is not yet approved or cleared by the [place] FDA and has been authorized for detection and/or diagnosis of SARS-CoV-2 by FDA under an Emergency Use Authorization (EUA). This EUA will remain in effect (meaning this test can be used) for the duration of the COVID-19 declaration under Section 564(b)(1) of the Act, 21 U.S.C. section 360bbb-3(b)(1), unless the authorization is terminated or revoked.  Performed at [facility], [place] [address] [place], [place] [address]   CBG monitoring, ED     Status: None   Collection Time: 03/20/2022 11:18 AM  Result Value Ref Range   Glucose-Capillary 90 70 - 99 mg/dL    Comment: Glucose reference range applies only to samples taken after fasting for at least 8 hours.  Urinalysis, Routine w reflex microscopic     Status: Abnormal   Collection Time: 02/20/2022 11:21 AM  Result Value Ref Range   Color, Urine AMBER (A) YELLOW    Comment: BIOCHEMICALS MAY BE AFFECTED BY COLOR   APPearance CLOUDY (A) CLEAR   Specific Gravity, Urine 1.017 1.005 - 1.030   pH 5.0 5.0 - 8.0   Glucose, UA NEGATIVE NEGATIVE mg/dL   Hgb urine dipstick MODERATE (A) NEGATIVE   Bilirubin Urine NEGATIVE NEGATIVE   Ketones, ur NEGATIVE NEGATIVE mg/dL   Protein, ur 100 (A) NEGATIVE mg/dL   Nitrite NEGATIVE NEGATIVE   Leukocytes,Ua NEGATIVE NEGATIVE   RBC / HPF 0-5 0 - 5 RBC/hpf   WBC, UA 11-20 0 - 5 WBC/hpf   Bacteria, UA MANY (A) NONE SEEN   Squamous Epithelial / LPF 0-5 0 - 5   Mucus PRESENT    Sperm, UA PRESENT     Comment: Performed at  [facility], [address], [place], [place] [address]  I-stat chem 8, ED     Status: Abnormal   Collection Time: 03/15/2022 11:28 AM  Result Value Ref Range   Sodium 134 (L) 135 - 145 mmol/L   Potassium 2.8 (L) 3.5 - 5.1 mmol/L   Chloride 101 98 - 111 mmol/L   BUN 56 (H) 8 - 23 mg/dL   Creatinine, Ser 5.20 (H) 0.61 - 1.24 mg/dL   Glucose, Bld 96 70 - 99 mg/dL    Comment: Glucose reference range applies only to samples taken after fasting for at least 8 hours.   Calcium, Ion 1.07 (L) 1.15 - 1.40 mmol/L   TCO2 15 (L) 22 - 32 mmol/L   Hemoglobin 14.3 13.0 - 17.0 g/dL   HCT 42.0 39.0 - 52.0 %  Type and screen [place]     Status: None   Collection Time: 03/18/2022 12:30 PM  Result Value Ref Range   ABO/RH(D) O POS    Antibody Screen NEG    Sample Expiration      03/16/2022,2359 Performed at [place] [address], [place], [place] [address]   Magnesium     Status: None   Collection  Time: 03/16/2022 12:36 PM  Result Value Ref Range   Magnesium 1.9 1.7 - 2.4 mg/dL    Comment: Performed at [facility], [place] [address], [place], [place] [address]  Lactic acid, plasma     Status: Abnormal   Collection Time: 03/09/2022  1:00 PM  Result Value Ref Range   Lactic Acid, Venous 8.9 (HH) 0.5 - 1.9 mmol/L    Comment: CRITICAL VALUE NOTED. VALUE IS CONSISTENT WITH PREVIOUSLY REPORTED/CALLED VALUE Performed at [facility], [place] [address], [place], [place] [address]    CT ABDOMEN PELVIS W CONTRAST  Result Date: 02/22/2022 CLINICAL DATA:  Abdominal pain, acute, nonlocalized EXAM: CT ABDOMEN AND PELVIS WITH CONTRAST TECHNIQUE: Multidetector CT imaging of the abdomen and pelvis was performed using the standard protocol following bolus administration of intravenous contrast. RADIATION DOSE REDUCTION: This exam was performed according to the departmental dose-optimization program which includes automated exposure control, adjustment of the mA and/or kV  according to patient size and/or use of iterative reconstruction technique. CONTRAST:  6m OMNIPAQUE IOHEXOL 350 MG/ML SOLN COMPARISON:  None Available. FINDINGS: Lower chest: Bibasilar subsegmental atelectasis. Heart size within normal limits. Gynecomastia is evident on the right. Hepatobiliary: Abnormal appearance of the gallbladder which is moderately dilated with wall thickening, pericholecystic fluid, and pericholecystic fat stranding. No hyperdense gallstone. There is an irregular hypoattenuating lesion within the liver adjacent to the gallbladder fossa measuring approximately 4.4 x 2.5 x 3.6 cm with internal density slightly greater than fluid. Liver appears otherwise unremarkable. No intrahepatic biliary dilatation. Pancreas: Unremarkable. No pancreatic ductal dilatation or surrounding inflammatory changes. Spleen: Normal in size without focal abnormality. Adrenals/Urinary Tract: Unremarkable adrenal glands. Kidneys enhance symmetrically without solid lesion, stone, or hydronephrosis. Ureters are nondilated. Urinary bladder is decompressed by Foley catheter. Stomach/Bowel: Long segment wall thickening of the colon centered at the hepatic flexure, which may be reactive secondary to local inflammation associated with the gallbladder. Sigmoid diverticulosis. Normal appendix in the right lower quadrant. No dilated loops of bowel. Small hiatal hernia. Vascular/Lymphatic: Scattered aortoiliac atherosclerotic calcifications without aneurysm. No abdominopelvic lymphadenopathy. Reproductive: Prostate is unremarkable. Other: No ascites. No abdominopelvic fluid collection. No pneumoperitoneum. No abdominal wall hernia. Musculoskeletal: No acute or significant osseous findings. IMPRESSION: 1. Appearance of the gallbladder is highly suspicious for acute cholecystitis. Consider further evaluation with right upper quadrant ultrasound. 2. Irregular low-density lesion within the liver adjacent to the gallbladder fossa  measuring up to 4.4 cm. This could represent a hepatic abscess, complex cyst, or neoplasm. 3. Long segment wall thickening of the colon centered at the hepatic flexure, which may be reactive secondary to local inflammation associated with the gallbladder versus a nonspecific colitis. 4. Sigmoid diverticulosis without evidence of acute diverticulitis. 5. Aortic Atherosclerosis (ICD10-I70.0). Electronically Signed   By: NDavina PokeD.O.   On: 03/15/2022 14:02   CT Angio Chest PE W and/or Wo Contrast  Result Date: 03/08/2022 CLINICAL DATA:  Chest pain. EXAM: CT ANGIOGRAPHY CHEST WITH CONTRAST TECHNIQUE: Multidetector CT imaging of the chest was performed using the standard protocol during bolus administration of intravenous contrast. Multiplanar CT image reconstructions and MIPs were obtained to evaluate the vascular anatomy. RADIATION DOSE REDUCTION: This exam was performed according to the departmental dose-optimization program which includes automated exposure control, adjustment of the mA and/or kV according to patient size and/or use of iterative reconstruction technique. CONTRAST:  812mOMNIPAQUE IOHEXOL 350 MG/ML SOLN COMPARISON:  June 28, 2018. FINDINGS: Cardiovascular: Satisfactory opacification of the pulmonary arteries to the segmental  level. No evidence of pulmonary embolism. Normal heart size. No pericardial effusion. Mediastinum/Nodes: No enlarged mediastinal, hilar, or axillary lymph nodes. Thyroid gland, trachea, and esophagus demonstrate no significant findings. Lungs/Pleura: Lungs are clear. No pleural effusion or pneumothorax. Upper Abdomen: No acute abnormality. Musculoskeletal: No chest wall abnormality. No acute or significant osseous findings. Review of the MIP images confirms the above findings. IMPRESSION: No definite evidence of pulmonary embolus. No acute abnormality seen in the chest. Electronically Signed   By: Marijo Conception M.D.   On: 03/07/2022 13:58   CT Head Wo  Contrast  Result Date: 03/19/2022 CLINICAL DATA:  Head trauma. EXAM: CT HEAD WITHOUT CONTRAST TECHNIQUE: Contiguous axial images were obtained from the base of the skull through the vertex without intravenous contrast. RADIATION DOSE REDUCTION: This exam was performed according to the departmental dose-optimization program which includes automated exposure control, adjustment of the mA and/or kV according to patient size and/or use of iterative reconstruction technique. COMPARISON:  None Available. FINDINGS: Brain: No evidence of acute infarction, hemorrhage, hydrocephalus, extra-axial collection or mass lesion/mass effect. There are sequela of moderate chronic microvascular ischemic change with advanced generalized volume loss. Vascular: No hyperdense vessel or unexpected calcification. Skull: Normal. Negative for fracture or focal lesion. Sinuses/Orbits: Bilateral lens replacement. Mild mucosal thickening bilateral maxillary and ethmoid sinuses, as well as right sphenoid sinus. Other: None. IMPRESSION: No CT evidence of acute intracranial injury. Electronically Signed   By: Marin Roberts M.D.   On: 03/18/2022 13:51   DG Chest Port 1 View  Result Date: 02/24/2022 CLINICAL DATA:  Provided history: Questionable sepsis-evaluate for abnormality. EXAM: PORTABLE CHEST 1 VIEW COMPARISON:  Prior chest radiographs 09/12/2020 and earlier. FINDINGS: Heart size within normal limits. Aortic atherosclerosis. Minimal atelectasis within the right lung base. No appreciable airspace consolidation or pulmonary edema. No evidence of pleural effusion or pneumothorax. No acute bony abnormality identified. IMPRESSION: Minimal atelectasis within the right lung base. Otherwise, no evidence of acute cardiopulmonary abnormality. Aortic Atherosclerosis (ICD10-I70.0). Electronically Signed   By: Kellie Simmering D.O.   On: 02/26/2022 12:23      Assessment/Plan Sepsis secondary to Acute cholecystitis with likely Liver abscess The  patient has been seen and examined and relevant labs and imaging reviewed.  He is acutely ill with work-up concerning for cholecystitis etiology.  Platelets are 30 and INR 1.9.  He is not medically stable for surgical intervention and recommend IR evaluation for percutaneous cholecystostomy.  I have placed the order and discussed with radiologist personally. He is on broad spectrum abx therapy with Cefepime/Flagyl/Vanc.  I have had a lengthy discussion with the patient and his wife who is at bedside regarding the findings as well as plan for perc chole drain.  We discussed likely duration of of tube placement as well as interval lap chole pending he medically improves and is able to proceed at that time.  CCM present and admitting patient and cased discussed with their team as well as the EDP.  FEN: NPO/IVFs/pressor/ K replacement ID: cefepime/flagyl/vanc VTE: none given low platelets  A fib RVR - s/p cardioversion.  Troponin elevated, defer to primary ARF - essentially oliguric - foley in place with only 100cc present ? DIC - defer to primary Supra-therapeutic INR - secondary to sepsis Thrombocytopenia - secondary to sepsis HLD Hypokalemia - being replaced Elevated LFTs - secondary to above, monitor    I reviewed ED provider notes, last 24 h vitals and pain scores, last 48 h intake and output, last 24 h  labs and trends, last 24 h imaging results, and discussed patient with IR, CCM, and EDP in person .   Henreitta Cea, [facility] 03/20/2022, 2:41 PM Please see Amion for pager number during day hours 7:00am-4:30pm

## 2022-03-10 NOTE — ED Provider Notes (Signed)
[facility] EMERGENCY DEPARTMENT Provider Note   CSN: [id number] Arrival date & time: 03/07/2022  1051     History  Chief Complaint  Patient presents with   Chest Pain    Eric [name] is a 71 y.o. male.  71 year old male with a history of hypertension, hyperlipidemia, tobacco abuse, and COPD who presents to the emergency department with nausea and vomiting, abdominal pain, tachycardia, and shortness of breath.  History obtained per EMS he states that no one was called for abdominal pain and nausea and vomiting.  They found the patient severely tachypneic as well as initially in normal sinus rhythm at approximately 70 to 80 bpm but when they moved the patient over to their stretcher he went into atrial fibrillation with RVR at a rate of 140 to 150 bpm.  The patient states that he has been having some nausea and vomiting and right-sided abdominal pain.  Says that the nausea and vomiting started first after he went out to eat and he assumed that it was food poisoning.  Says that he has had some diarrhea since then.  Reports that he has diffuse abdominal pain that is worse on his right side.  History obtained per the patient's wife after he was initially seen in the emergency department.  She states that he was in his usual state of health until last Thursday when he did have something to eat that they assumed caused food poisoning he had 2 episodes of nonbloody nonbilious emesis.  She reported that after that he started having abdominal pain.  Said that since then he has been only drinking Gatorade and has been very fatigued.  Reports that yesterday he seemed to have improved and was walking around the house but this morning he appeared ill again and started having rigors and an episode where he had a loose bowel movement on himself so she called 911.  States that he was not complaining of any chest pain.  Did appear short of breath but has not had any cough or fevers  recently.       Home Medications Prior to Admission medications   Medication Sig Start Date End Date Taking? Authorizing Provider  calcium carbonate (TUMS - DOSED IN MG ELEMENTAL CALCIUM) 500 MG chewable tablet Chew 1,000 mg by mouth daily as needed for indigestion or heartburn.   Yes [provider]  citalopram (CELEXA) 20 MG tablet Take 1 tablet (20 mg total) by mouth daily. 07/16/21 07/16/22 Yes Liane Comber, NP  Cyanocobalamin (B-12 PO) Take 1 capsule by mouth daily.   Yes [provider]  ibuprofen (ADVIL) 200 MG tablet Take 400 mg by mouth every 4 (four) hours as needed for moderate pain.   Yes [provider]  rosuvastatin (CRESTOR) 5 MG tablet TAKE 1 TABLET BY MOUTH DAILY FOR CHOLESTEROL AND PLAQUE ON ARTERIES Patient taking differently: Take 5 mg by mouth daily. 12/02/21  Yes Alycia Rossetti, NP  VITAMIN D, CHOLECALCIFEROL, PO Take 10,000 Units by mouth daily.   Yes [provider]      Allergies    Lipitor [atorvastatin], Lopid [gemfibrozil], and Penicillins    Review of Systems   Review of Systems  Physical Exam Updated Vital Signs BP (!) 89/70   Pulse (!) 102   Temp (!) 97.2 F (36.2 C) (Axillary)   Resp (!) 28   Ht 6' (1.829 m)   Wt 95.3 kg   SpO2 94%   BMI 28.48 kg/m  Physical Exam  Vitals and nursing note reviewed.  Constitutional:      General: He is in acute distress.     Appearance: He is well-developed. He is ill-appearing.     Comments: Diaphoretic.  Anxious appearing.  HENT:     Head: Normocephalic and atraumatic.     Right Ear: External ear normal.     Left Ear: External ear normal.     Nose: Nose normal.  Eyes:     Extraocular Movements: Extraocular movements intact.     Conjunctiva/sclera: Conjunctivae normal.     Pupils: Pupils are equal, round, and reactive to light.  Cardiovascular:     Rate and Rhythm: Tachycardia present. Rhythm irregular.     Heart sounds: Normal heart sounds.  Pulmonary:      Effort: Respiratory distress present.     Breath sounds: Normal breath sounds. No wheezing, rhonchi or rales.     Comments: Tachypneic on 3 L oxygen Abdominal:     General: There is no distension.     Palpations: Abdomen is soft. There is no mass.     Tenderness: There is abdominal tenderness (Right lower quadrant, right midabdomen, right upper quadrant). There is no guarding.  Musculoskeletal:        General: No swelling.     Cervical back: Normal range of motion and neck supple.     Right lower leg: No edema.     Left lower leg: No edema.  Skin:    General: Skin is warm and dry.     Capillary Refill: Capillary refill takes less than 2 seconds.  Neurological:     Mental Status: Mental status is at baseline.  Psychiatric:        Mood and Affect: Mood normal.        Behavior: Behavior normal.     ED Results / Procedures / Treatments   Labs (all labs ordered are listed, but only abnormal results are displayed) Labs Reviewed  LACTIC ACID, PLASMA - Abnormal; Notable for the following components:      Result Value   Lactic Acid, Venous >9.0 (*)    All other components within normal limits  LACTIC ACID, PLASMA - Abnormal; Notable for the following components:   Lactic Acid, Venous 8.9 (*)    All other components within normal limits  COMPREHENSIVE METABOLIC PANEL - Abnormal; Notable for the following components:   Potassium 2.8 (*)    CO2 14 (*)    Glucose, Bld 105 (*)    BUN 61 (*)    Creatinine, Ser 4.86 (*)    Calcium 8.7 (*)    Total Protein 5.0 (*)    Albumin 2.2 (*)    AST 95 (*)    ALT 45 (*)    Alkaline Phosphatase 520 (*)    Total Bilirubin 3.3 (*)    GFR, Estimated 12 (*)    Anion gap 23 (*)    All other components within normal limits  CBC WITH DIFFERENTIAL/PLATELET - Abnormal; Notable for the following components:   RBC 4.19 (*)    MCV 102.1 (*)    MCH 34.1 (*)    Platelets 30 (*)    Monocytes Absolute 1.1 (*)    nRBC 1 (*)    Abs Immature Granulocytes  0.30 (*)    All other components within normal limits  PROTIME-INR - Abnormal; Notable for the following components:   Prothrombin Time 21.5 (*)    INR 1.9 (*)    All other components within normal limits  APTT - Abnormal; Notable for the following components:   aPTT 42 (*)    All other components within normal limits  URINALYSIS, ROUTINE W REFLEX MICROSCOPIC - Abnormal; Notable for the following components:   Color, Urine AMBER (*)    APPearance CLOUDY (*)    Hgb urine dipstick MODERATE (*)    Protein, ur 100 (*)    Bacteria, UA MANY (*)    All other components within normal limits  DIC (DISSEMINATED INTRAVASCULAR COAGULATION)PANEL - Abnormal; Notable for the following components:   Prothrombin Time 20.0 (*)    INR 1.7 (*)    aPTT 37 (*)    Fibrinogen 693 (*)    D-Dimer, Quant >20.00 (*)    Platelets 25 (*)    All other components within normal limits  BASIC METABOLIC PANEL - Abnormal; Notable for the following components:   Sodium 133 (*)    Potassium 3.0 (*)    Chloride 96 (*)    CO2 21 (*)    Glucose, Bld 121 (*)    BUN 63 (*)    Creatinine, Ser 4.95 (*)    Calcium 8.4 (*)    GFR, Estimated 12 (*)    Anion gap 16 (*)    All other components within normal limits  LACTIC ACID, PLASMA - Abnormal; Notable for the following components:   Lactic Acid, Venous 5.0 (*)    All other components within normal limits  GLUCOSE, CAPILLARY - Abnormal; Notable for the following components:   Glucose-Capillary 123 (*)    All other components within normal limits  CBG MONITORING, ED - Abnormal; Notable for the following components:   Glucose-Capillary 116 (*)    All other components within normal limits  I-STAT CHEM 8, ED - Abnormal; Notable for the following components:   Sodium 134 (*)    Potassium 2.8 (*)    BUN 56 (*)    Creatinine, Ser 5.20 (*)    Calcium, Ion 1.07 (*)    TCO2 15 (*)    All other components within normal limits  TROPONIN I (HIGH SENSITIVITY) - Abnormal;  Notable for the following components:   Troponin I (High Sensitivity) 812 (*)    All other components within normal limits  TROPONIN I (HIGH SENSITIVITY) - Abnormal; Notable for the following components:   Troponin I (High Sensitivity) 886 (*)    All other components within normal limits  RESP PANEL BY RT-PCR (FLU A&B, COVID) ARPGX2  CULTURE, BLOOD (ROUTINE X 2)  CULTURE, BLOOD (ROUTINE X 2)  URINE CULTURE  AEROBIC/ANAEROBIC CULTURE W GRAM STAIN (SURGICAL/DEEP WOUND)  MAGNESIUM  LACTIC ACID, PLASMA  CBC  COMPREHENSIVE METABOLIC PANEL  MAGNESIUM  PROTIME-INR  CBG MONITORING, ED  TYPE AND SCREEN  TROPONIN I (HIGH SENSITIVITY)    EKG EKG Interpretation  Date/Time:  Tuesday March 10 2022 12:36:46 EDT Ventricular Rate:  98 PR Interval:  159 QRS Duration: 117 QT Interval:  363 QTC Calculation: 452 R Axis:   62 Text Interpretation: Sinus rhythm Atrial premature complex Nonspecific intraventricular conduction delay Borderline low voltage, extremity leads Borderline ST elevation, lateral leads Confirmed by Margaretmary Eddy [phone]) on 03/02/2022 12:37:43 PM  Radiology IR Perc Cholecystostomy  Result Date: 02/26/2022 INDICATION: Acute cholecystitis and sepsis. Percutaneous cholecystostomy E required as the patient is not a candidate for cholecystectomy currently. EXAM: PERCUTANEOUS CHOLECYSTOSTOMY TUBE PLACEMENT MEDICATIONS: None ANESTHESIA/SEDATION: Formal conscious sedation was not performed due to critical illness and poor airway. The patient was given 1.0 mg IV Versed during the  procedure. FLUOROSCOPY: Radiation Exposure Index (as provided by the fluoroscopic device): 1.0 mGy Kerma COMPLICATIONS: None immediate. PROCEDURE: Informed written consent was obtained from the patient's wife after a thorough discussion of the procedural risks, benefits and alternatives. All questions were addressed. Maximal Sterile Barrier Technique was utilized including caps, mask, sterile gowns, sterile  gloves, sterile drape, hand hygiene and skin antiseptic. A timeout was performed prior to the initiation of the procedure. Ultrasound was used to localize the gallbladder. Local anesthesia was provided with 1% lidocaine. An 18 gauge trocar needle was advanced into the gallbladder lumen under ultrasound guidance. After confirming needle tip position and with return of bile, a guidewire was advanced into the gallbladder lumen and the needle removed. The tract was dilated to 10 [place]. A 10 French percutaneous drain was advanced into the gallbladder. A sample of bile was then withdrawn through the drain and sent for culture analysis. Contrast was injected via the cholecystostomy tube and a fluoroscopic spot image obtained. The catheter was then flushed with sterile saline and attached to a gravity drainage bag. It was secured at the skin with a Prolene retention suture and StatLock device. FINDINGS: Ultrasound demonstrates inflamed gallbladder with gallbladder wall thickening as well as edematous wall and edema surrounding the gallbladder fossa within the liver. A discrete hepatic abscess is not identified with small pockets of fluid and edema adjacent to the gallbladder within the liver parenchyma. After placement of the cholecystostomy tube there is return of cloudy purulent appearing bile. A sample was sent for culture analysis. IMPRESSION: Placement of percutaneous cholecystostomy tube into the gallbladder. A sample of purulent bile was sent for culture analysis. Initial ultrasound demonstrates adjacent areas of fluid and edema within the liver adjacent to the gallbladder fossa. A discrete unilocular abscess was not identified. Electronically Signed   By: Aletta Edouard M.D.   On: 03/20/2022 16:35   US Abdomen Limited RUQ (LIVER/GB)  Result Date: 03/03/2022 CLINICAL DATA:  Right upper quadrant pain. EXAM: ULTRASOUND ABDOMEN LIMITED RIGHT UPPER QUADRANT COMPARISON:  CT abdomen and pelvis 03/04/2022 FINDINGS:  Gallbladder: The gallbladder wall is again thickened, measuring up to 7 mm. The sonographer reports a positive sonographic Murphy's sign" with patient pain when the probe is pressed on the gallbladder. There is again mild pericholecystic fluid. No gallstone is seen. Common bile duct: Diameter: 5 mm, within normal limits. Liver: There is a hypoechoic region with lobular margins just anterior and superior to the gallbladder corresponding to the dense fluid region on today's CT. This measures up to approximately 4.5 x 3.0 x 4.2 cm. There is heterogeneous hepatic echotexture. Portal vein is patent on color Doppler imaging with normal direction of blood flow towards the liver. Other: None. IMPRESSION: 1. There is again gallbladder wall thickening and pericholecystic fluid. Positive sonographic Murphy's sign. Findings suggest acalculous cholecystitis. Note is made that there was primary or secondary inflammatory change within the adjacent hepatic flexure of the colon on today's CT. It is unclear whether the primary cause of the inflammation is the gallbladder, colon, or both. 2. Mildly complex fluid collection adjacent to the gallbladder fossa as seen on CT. Differential considerations again include a possible complex cyst or abscess. Electronically Signed   By: Yvonne Kendall M.D.   On: 02/21/2022 15:19   CT ABDOMEN PELVIS W CONTRAST  Result Date: 03/21/2022 CLINICAL DATA:  Abdominal pain, acute, nonlocalized EXAM: CT ABDOMEN AND PELVIS WITH CONTRAST TECHNIQUE: Multidetector CT imaging of the abdomen and pelvis was performed using the standard protocol  following bolus administration of intravenous contrast. RADIATION DOSE REDUCTION: This exam was performed according to the departmental dose-optimization program which includes automated exposure control, adjustment of the mA and/or kV according to patient size and/or use of iterative reconstruction technique. CONTRAST:  8m OMNIPAQUE IOHEXOL 350 MG/ML SOLN COMPARISON:   None Available. FINDINGS: Lower chest: Bibasilar subsegmental atelectasis. Heart size within normal limits. Gynecomastia is evident on the right. Hepatobiliary: Abnormal appearance of the gallbladder which is moderately dilated with wall thickening, pericholecystic fluid, and pericholecystic fat stranding. No hyperdense gallstone. There is an irregular hypoattenuating lesion within the liver adjacent to the gallbladder fossa measuring approximately 4.4 x 2.5 x 3.6 cm with internal density slightly greater than fluid. Liver appears otherwise unremarkable. No intrahepatic biliary dilatation. Pancreas: Unremarkable. No pancreatic ductal dilatation or surrounding inflammatory changes. Spleen: Normal in size without focal abnormality. Adrenals/Urinary Tract: Unremarkable adrenal glands. Kidneys enhance symmetrically without solid lesion, stone, or hydronephrosis. Ureters are nondilated. Urinary bladder is decompressed by Foley catheter. Stomach/Bowel: Long segment wall thickening of the colon centered at the hepatic flexure, which may be reactive secondary to local inflammation associated with the gallbladder. Sigmoid diverticulosis. Normal appendix in the right lower quadrant. No dilated loops of bowel. Small hiatal hernia. Vascular/Lymphatic: Scattered aortoiliac atherosclerotic calcifications without aneurysm. No abdominopelvic lymphadenopathy. Reproductive: Prostate is unremarkable. Other: No ascites. No abdominopelvic fluid collection. No pneumoperitoneum. No abdominal wall hernia. Musculoskeletal: No acute or significant osseous findings. IMPRESSION: 1. Appearance of the gallbladder is highly suspicious for acute cholecystitis. Consider further evaluation with right upper quadrant ultrasound. 2. Irregular low-density lesion within the liver adjacent to the gallbladder fossa measuring up to 4.4 cm. This could represent a hepatic abscess, complex cyst, or neoplasm. 3. Long segment wall thickening of the colon  centered at the hepatic flexure, which may be reactive secondary to local inflammation associated with the gallbladder versus a nonspecific colitis. 4. Sigmoid diverticulosis without evidence of acute diverticulitis. 5. Aortic Atherosclerosis (ICD10-I70.0). Electronically Signed   By: NDavina PokeD.O.   On: 03/01/2022 14:02   CT Angio Chest PE W and/or Wo Contrast  Result Date: 02/23/2022 CLINICAL DATA:  Chest pain. EXAM: CT ANGIOGRAPHY CHEST WITH CONTRAST TECHNIQUE: Multidetector CT imaging of the chest was performed using the standard protocol during bolus administration of intravenous contrast. Multiplanar CT image reconstructions and MIPs were obtained to evaluate the vascular anatomy. RADIATION DOSE REDUCTION: This exam was performed according to the departmental dose-optimization program which includes automated exposure control, adjustment of the mA and/or kV according to patient size and/or use of iterative reconstruction technique. CONTRAST:  886mOMNIPAQUE IOHEXOL 350 MG/ML SOLN COMPARISON:  June 28, 2018. FINDINGS: Cardiovascular: Satisfactory opacification of the pulmonary arteries to the segmental level. No evidence of pulmonary embolism. Normal heart size. No pericardial effusion. Mediastinum/Nodes: No enlarged mediastinal, hilar, or axillary lymph nodes. Thyroid gland, trachea, and esophagus demonstrate no significant findings. Lungs/Pleura: Lungs are clear. No pleural effusion or pneumothorax. Upper Abdomen: No acute abnormality. Musculoskeletal: No chest wall abnormality. No acute or significant osseous findings. Review of the MIP images confirms the above findings. IMPRESSION: No definite evidence of pulmonary embolus. No acute abnormality seen in the chest. Electronically Signed   By: JaMarijo Conception.D.   On: 02/27/2022 13:58   CT Head Wo Contrast  Result Date: 03/19/2022 CLINICAL DATA:  Head trauma. EXAM: CT HEAD WITHOUT CONTRAST TECHNIQUE: Contiguous axial images were obtained  from the base of the skull through the vertex without intravenous contrast. RADIATION DOSE REDUCTION: This  exam was performed according to the departmental dose-optimization program which includes automated exposure control, adjustment of the mA and/or kV according to patient size and/or use of iterative reconstruction technique. COMPARISON:  None Available. FINDINGS: Brain: No evidence of acute infarction, hemorrhage, hydrocephalus, extra-axial collection or mass lesion/mass effect. There are sequela of moderate chronic microvascular ischemic change with advanced generalized volume loss. Vascular: No hyperdense vessel or unexpected calcification. Skull: Normal. Negative for fracture or focal lesion. Sinuses/Orbits: Bilateral lens replacement. Mild mucosal thickening bilateral maxillary and ethmoid sinuses, as well as right sphenoid sinus. Other: None. IMPRESSION: No CT evidence of acute intracranial injury. Electronically Signed   By: Marin Roberts M.D.   On: 03/16/2022 13:51   DG Chest Port 1 View  Result Date: 03/17/2022 CLINICAL DATA:  Provided history: Questionable sepsis-evaluate for abnormality. EXAM: PORTABLE CHEST 1 VIEW COMPARISON:  Prior chest radiographs 09/12/2020 and earlier. FINDINGS: Heart size within normal limits. Aortic atherosclerosis. Minimal atelectasis within the right lung base. No appreciable airspace consolidation or pulmonary edema. No evidence of pleural effusion or pneumothorax. No acute bony abnormality identified. IMPRESSION: Minimal atelectasis within the right lung base. Otherwise, no evidence of acute cardiopulmonary abnormality. Aortic Atherosclerosis (ICD10-I70.0). Electronically Signed   By: Kellie Simmering D.O.   On: 03/19/2022 12:23    Procedures .Cardioversion  Date/Time: 02/27/2022 6:52 PM  Performed by: Fransico Meadow, MD Authorized by: Fransico Meadow, MD   Consent:    Consent obtained:  Emergent situation and verbal   Consent given by:  Patient   Risks  discussed:  Cutaneous burn and pain Pre-procedure details:    Cardioversion basis:  Emergent   Rhythm:  Atrial fibrillation   Electrode placement:  Anterior-posterior Patient sedated: Yes. Refer to sedation procedure documentation for details of sedation.  Attempt one:    Cardioversion mode:  Synchronous   Waveform:  Monophasic   Shock (Joules):  200   Shock outcome:  Conversion to normal sinus rhythm Post-procedure details:    Patient status:  Awake   Patient tolerance of procedure:  Tolerated well, no immediate complications Comments:     After attempting rate control with esmolol the patient became hypotensive and decision was made to emergently cardiovert him.  Patient was given 10 mg of etomidate and cardioverted and went back in the normal sinus rhythm afterwards.  .Sedation  Date/Time: 03/03/2022 6:53 PM  Performed by: Fransico Meadow, MD Authorized by: Fransico Meadow, MD   Consent:    Consent obtained:  Emergent situation and verbal   Consent given by:  Patient   Risks discussed:  Nausea, inadequate sedation, vomiting, prolonged sedation necessitating reversal and respiratory compromise necessitating ventilatory assistance and intubation Universal protocol:    Immediately prior to procedure, a time out was called: yes     Patient identity confirmed:  Verbally with patient Indications:    Procedure performed:  Cardioversion   Procedure necessitating sedation performed by:  Physician performing sedation Pre-sedation assessment:    Time since last food or drink:  Unknown   NPO status caution: unable to specify NPO status     ASA classification: class 5 - moribund patient, not expected to live without the procedure     Mouth opening:  3 or more finger widths   Thyromental distance:  3 finger widths   Mallampati score:  III - soft palate, base of uvula visible   Neck mobility: normal     Pre-sedation assessments completed and reviewed: airway patency, cardiovascular  function, hydration status,  mental status, nausea/vomiting, pain level, respiratory function and temperature   Immediate pre-procedure details:    Reviewed: vital signs     Verified: bag valve mask available, emergency equipment available, intubation equipment available, IV patency confirmed, oxygen available and suction available   Procedure details (see MAR for exact dosages):    Preoxygenation:  Nasal cannula   Sedation:  Etomidate   Intended level of sedation: deep   Analgesia:  None   Intra-procedure monitoring:  Cardiac monitor, continuous pulse oximetry, frequent vital sign checks, blood pressure monitoring and frequent LOC assessments   Intra-procedure events: none     Total Provider sedation time (minutes):  10 Post-procedure details:    Attendance: Constant attendance by certified staff until patient recovered     Recovery: Patient returned to pre-procedure baseline     Post-sedation assessments completed and reviewed: airway patency, cardiovascular function, hydration status, mental status, nausea/vomiting, pain level and respiratory function     Patient is stable for discharge or admission: yes     Procedure completion:  Tolerated well, no immediate complications Comments:     Sedation with etomidate for cardioversion due to A-fib with RVR.    Medications Ordered in ED Medications  lactated ringers infusion ( Intravenous Infusion Verify 02/20/2022 1800)  lactated ringers bolus 1,000 mL (0 mLs Intravenous Stopped 03/14/2022 1209)    And  lactated ringers bolus 1,000 mL (0 mLs Intravenous Stopped 03/14/2022 1300)    And  lactated ringers bolus 1,000 mL (1,000 mLs Intravenous New Bag/Given 02/22/2022 1700)    And  lactated ringers bolus 500 mL (has no administration in time range)  potassium chloride 10 mEq in 100 mL IVPB (10 mEq Intravenous New Bag/Given 03/10/2022 1305)  0.9 %  sodium chloride infusion (has no administration in time range)  vancomycin variable dose per unstable renal  function (pharmacist dosing) (has no administration in time range)  ceFEPIme (MAXIPIME) 2 g in sodium chloride 0.9 % 100 mL IVPB (has no administration in time range)  fentaNYL (SUBLIMAZE) injection 50 mcg (50 mcg Intravenous Given 02/23/2022 1826)  metroNIDAZOLE (FLAGYL) IVPB 500 mg (has no administration in time range)  fentaNYL (SUBLIMAZE) 100 MCG/2ML injection (has no administration in time range)  potassium chloride 10 mEq in 100 mL IVPB (0 mEq Intravenous Duplicate 0/26/37 8588)  levalbuterol (XOPENEX) nebulizer solution 0.63 mg (0.63 mg Nebulization Given 03/18/2022 1717)  0.9 %  sodium chloride infusion ( Intravenous Continued from Pre-op 03/09/2022 1856)  norepinephrine (LEVOPHED) '4mg'$  in 210m (0.016 mg/mL) premix infusion (has no administration in time range)  lip balm (CARMEX) ointment (has no administration in time range)  ceFEPIme (MAXIPIME) 2 g in sodium chloride 0.9 % 100 mL IVPB (0 g Intravenous Stopped 03/09/2022 1200)  metroNIDAZOLE (FLAGYL) IVPB 500 mg (0 mg Intravenous Stopped 03/09/2022 1315)  vancomycin (VANCOREADY) IVPB 2000 mg/400 mL (0 mg Intravenous Stopped 02/28/2022 1412)  calcium gluconate in NaCl 1-0.675 GM/50ML-% IVPB (0 g  Stopped 03/19/2022 1206)  etomidate (AMIDATE) injection (10 mg Intravenous Given 03/19/2022 1230)  iohexol (OMNIPAQUE) 350 MG/ML injection 100 mL (80 mLs Intravenous Contrast Given 03/14/2022 1342)  lidocaine (XYLOCAINE) 1 % (with pres) injection (4 mLs  Given 03/07/2022 1610)  midazolam (VERSED) injection (1 mg Intravenous Given 02/28/2022 1608)  iohexol (OMNIPAQUE) 300 MG/ML solution 50 mL (25 mLs Per Tube Contrast Given 03/17/2022 1622)  magnesium sulfate IVPB 2 g 50 mL (0 g Intravenous Stopped 03/20/2022 1839)    ED Course/ Medical Decision Making/ A&P Clinical Course as of 02/20/2022 1905  Tue Mar 10, 2022  1410 CT abdomen shows cholecystitis with possible hepatic abscess. [RP]  [place] PA from ICU who will evaluate the pt shortly.  [RP]  1610 Spoke with Saverio Danker from general surgery. Feels that he is not stable for OR and will require drain with IR. They are placing consult.  [RP]    Clinical Course User Index [RP] Fransico Meadow, MD                           Medical Decision Making Amount and/or Complexity of Data Reviewed Labs: ordered. Radiology: ordered.  Risk Prescription drug management. Decision regarding hospitalization.   Eric [name] is a 71 y.o. male with history of hypertension, hyperlipidemia, tobacco abuse, and COPD who presents with chief complaint of shortness of breath, abdominal pain, nausea, vomiting, diarrhea.  Initial Ddx:  Gastroenteritis, mesenteric ischemia, bowel obstruction, pulmonary embolism, atrial fibrillation with RVR, MI, pyelonephritis  MDM:  On presentation the patient appeared to have abdominal pain and nausea and vomiting his primary complaint but also had significant shortness of breath.  Due to this was concerned about the above diagnoses and felt that advanced imaging was required of his abdomen.  He was initially found to be in atrial fibrillation with RVR with stable blood pressure though he was ill-appearing.  Not on anticoagulation at home but appeared that his atrial fibrillation with RVR started in front of EMS so had not been likely going on for long period.  Shortness of breath with clear lungs is concerning for possible pulmonary embolism or shortness of breath from his atrial fibrillation with RVR and demand ischemia or type I MI but given his abdominal pain feel this is less likely.  Also was concern for severe metabolic acidosis causing his shortness of breath.  Plan:  Labs Lactate Troponin Electrolytes Cultures IV fluid bolus Broad-spectrum antibiotics CTA chest, CT abdomen pelvis with IV contrast CT head (since wife later reported a fall) Rate control with esmolol  ED Summary:  Did attempt rate control with esmolol since it was short acting and the patient was  ill-appearing but had initially stable blood pressures.  He was given 3 L of fluids prior to this.  As his heart rate decreased so did his blood pressure and he became hypotensive.  The patient was started on norepinephrine and was electrically cardioverted using etomidate for procedural sedation.  Norepinephrine initially was at 15 mcg a minute and was able to be weaned down to 10 mcg/min.  He was given broad-spectrum antibiotics and his labs returned it appeared that he was in severe shock with a lactate that was undetectably high.  He had a nonanion gap metabolic acidosis on i-STAT which was believed to be due to his GI losses and lactic acidosis and was given fluids along with bicarb administration.  Was also found to have Labs are significant for possible DIC.  Given the severity of his illness did consider noncontrasted CT scans but felt that due to the acuity of the illness the patient truly did need contrast to make a diagnosis expeditiously.  Patient CT scans did not show evidence of PE or abnormality in the chest leading me to believe that his tachypnea is from his metabolic acidosis.  CT of the abdomen pelvis did show possible acalculous cholecystitis with evidence of hepatic abscess so general surgery was consulted who reached out to interventional radiology to have percutaneous cholecystostomy performed.  Also  reached out to the ICU who will admit the patient.  Did consider anticoagulating the patient for his cardioversion but given the fact that his atrial fibrillation started for EMS and the fact that he was in DIC felt that the harm of anticoagulation outweighed any benefit for stroke prevention at this time.  Dispo: ICU   Additional history obtained from spouse Records reviewed Care Everywhere The following labs were independently interpreted: Chemistry and Urinalysis I independently visualized the following imaging with scope of interpretation limited to determining acute life threatening  conditions related to emergency care: CT Abdomen/Pelvis, which revealed  possible cholecystitis. CT head without acute abnormality.    Consults: General Surgery and ICU  CRITICAL CARE Performed by: Fransico Meadow   Total critical care time: 120 minutes  Critical care time was exclusive of separately billable procedures and treating other patients.  Critical care was necessary to treat or prevent imminent or life-threatening deterioration.  Critical care was time spent personally by me on the following activities: development of treatment plan with patient and/or surrogate as well as nursing, discussions with consultants, evaluation of patient's response to treatment, examination of patient, obtaining history from patient or surrogate, ordering and performing treatments and interventions, ordering and review of laboratory studies, ordering and review of radiographic studies, pulse oximetry and re-evaluation of patient's condition.  Final Clinical Impression(s) / ED Diagnoses Final diagnoses:  Septic shock ([place])  Acalculous cholecystitis  DIC (disseminated intravascular coagulation) ([place])  Atrial fibrillation with RVR ([place])  AKI (acute kidney injury) [facility])    Rx / DC Orders ED Discharge Orders     None         Fransico Meadow, MD 03/21/2022 1905

## 2022-03-10 NOTE — ED Notes (Signed)
Transported pt via stretcher on 3L [place] and monitoring with drips infusing and wife at bedside. Reprt given and care endorsed to [facility] and Public relations account executive.

## 2022-03-10 NOTE — Progress Notes (Signed)
Pharmacy Antibiotic Note  Eric [name] is a 71 y.o. male admitted on 02/25/2022 with  septic shock with evidence of end-organ dysfunction with AKI and elevated LFTs .  Pharmacy has been consulted for Cefepime and Vancomycin dosing.  WBC 6.9, afebrile, LA >9, HR 130s, RR 38 SCr 5.2 (b/l ~1-1.1)  Plan: Initiate Cefepime 2g IV q24h Initiate loading dose of Vancomycin '2000mg'$  IV x 1, followed by  Vancomycin variable dosing per levels    > Goal Vancomycin trough level 15-20 Continue Metronidazole per MD Monitor daily CBC, temp, SCr, and for clinical signs of improvement  Adjust antibiotic dosing based on renal function F/u cultures and de-escalate antibiotics as able   Height: 6' (182.9 cm) Weight: 95.3 kg (210 lb) IBW/kg (Calculated) : 77.6  No data recorded.  Recent Labs  Lab 03/07/2022 1104 03/16/2022 1128  WBC 6.9  --   CREATININE  --  5.20*  LATICACIDVEN >9.0*  --     Estimated Creatinine Clearance: 15.6 mL/min (A) (by C-G formula based on SCr of 5.2 mg/dL (H)).    Allergies  Allergen Reactions   Lipitor [Atorvastatin]     Cloudy headed    Lopid [Gemfibrozil]     Unknown reaction. Patient doesn't remember taking this medication.   Penicillins     Unknown childhood reaction  Tolerated Cephalosporin Date: 11/26/20.  Tolerated Cephalosporin Date: 05/06/21.      Antimicrobials this admission: Cefepime 9/19 >>  Vancomycin 9/19 >>  Metronidazole 9/19 >>   Dose adjustments this admission: N/A  Microbiology results: 9/19 BCx: pending 9/19 UCx: pending   Thank you for allowing pharmacy to be a part of this patient's care.  Luisa Hart, PharmD, BCPS Clinical Pharmacist 03/12/2022 1:31 PM   Please refer to AMION for pharmacy phone number

## 2022-03-10 NOTE — H&P (Signed)
NAME:  Eric [name], MRN:  [id number], DOB:  06-01-1951, LOS: 0 ADMISSION DATE:  02/24/2022, CONSULTATION DATE:  03/09/2022 REFERRING MD:  Dr. Philip Aspen, CHIEF COMPLAINT:  Abd pain   History of Present Illness:   71 year old male with PMH has below who presented from home with abdominal pain.  Patient reports he had sudden onset of abdominal pain with nausea and vomiting on 9/14 in which he attributed to food poisoning.  Has only been able to tolerate liquids, saltine crackers, and some protein shakes over the weekend.  Tried eating soup yesterday and vomited that up with one episode of diarrhea and had chills.  Denies fevers, SOB, or chest pain.  Has abrasion to left knee, patient does not remember how he obtained that.  Reports not voiding as normal.  Given worsening abdominal pain, he presented to ER.   Arrived in ER in Afib with RVR and normotensive initially.  Was placed on esmolol for rate control then became hypotensive requiring emergent cardioversion back to ST.  Ongoing hypotension despite 3L IVF, therefore started on norepinephrine.   Workup in ER noted for K 2.8, sCr 4.86 (previously 1.18 in 01/2022), bicarb 14, iCa 1.07, alk phos 520, AST/ ALT 95/45, t. Bili 3.3, trop hs 821, WBC 6.9, Hgb 14.3, plts 30, lactic acid > 9 with repeat 8.9.  CTA PE was neg for PE with no acute abnormalities in the chest, CTH neg, and CT a/p w/contrast concerning for acute cholecystitis and liver lesion adjacent to the gallbladder which could represent hepatic abscess, complex cyst, or neoplasm.  RUQ US showed gallbladder thickening and pericholecystic fluid, positive Murphy's sign suggestive of acalculous cholecystitis, mildly complex fluid collection adjacent to gallbladder seen again.  Cultures sent and started on empiric abx.  Treated with calcium, bicarb push then drip, and KCL.  Surgery and IR have been consulted.  PCCM called for admit.   Pertinent  Medical History   Past Medical History:  Diagnosis Date    Arteriosclerosis of aorta ([place])    Arthritis    Basal cell carcinoma    GERD (gastroesophageal reflux disease)    Hyperlipidemia    Labile hypertension    pt denies    OA (osteoarthritis) of knee 11/25/2020   Other testicular hypofunction    Prediabetes    (A1c 5.8% / 2013)   Vitamin D deficiency   Former smoker, quit 20 yrs ago ETOH use> up to 2 glasses of wine/ day, last drink over 1 week ago Denies elicit drug abuse Retired, former Pharmacist, community   [place] Events: Including procedures, antibiotic start and stop dates in addition to other pertinent events   9/19 admitted septic shock, acute cholecystitis, IR> drain   Interim History / Subjective:  Currently on NE 10 mcg, bicarb gtt  Objective   Blood pressure (!) 75/55, pulse (!) 49, resp. rate (!) 30, height 6' (1.829 m), weight 95.3 kg, SpO2 (!) 64 %.        Intake/Output Summary (Last 24 hours) at 03/20/2022 1523 Last data filed at 02/28/2022 1300 Gross per 24 hour  Intake 4600 ml  Output --  Net 4600 ml   Filed Weights   03/05/2022 1115  Weight: 95.3 kg   Examination: General:  ill appearing older male lying in bed, pleasant HEENT: MM pink/dry, pupils 4/reactive, anicteric  Neuro: Awake, oriented x 3 but ?intermittent confused at times, MAE CV: rr, ST low 100, distant Hrs, RUE alline, intact distal pulses PULM:  tachypneic, mildly increased WOB, slight audible wheeze > upper airway, lungs clear otherwise GI:  mild distention, tender/ guards, hypoBS, foley  Extremities: warm/dry, no LE edema, abrasion to left knee Skin: mottled from feet up to mid abd, fingers dusky   Resolved Hospital Problem list    Assessment & Plan:   Septic shock secondary to acute acalculous cholecystitis with possible liver abscess vs cyst Transaminitis  - admit to ICU - NPO for now - appreciate CCS and IR assistance.  Patient going for emergent percutaneous biliary drain.  Have asked IR to weight in on liver lesion  as well, if drain needed - cont NE for MAP goal 65-75 - currently on 4th liter of IVF - trend lactic  - has aline.  Will need a CVL on arrival to ICU.  - follow cultures - continue cefepime, vanc, and flagyl  - fentanyl prn pain - daily CBC/ CMET/ coags   AKI > likely pre-renal from poor PO intake, hypotension, and did get IV contrast  Hypokalemia AGMA/ lactic acidosis - UA with mod Hgb, no RBC, 100 protein, neg leuk/ nitrates, follow UC - continue bicarb gtt - s/p 2 runs of KCL in ER - no hydronephrosis/ blockage noted on CT a/p - continue foley for now - strict I/Os - recheck BMET post IR  - avoid further nephrotoxins   Hypoxia respiratory failure  - CXR and CTA PE neg for acute process - likely in setting of splinting, developing atelectasis from abdominal process - monitor WOB/ tachypnea> likely trying to compensate for acidosis  - continue supplemental O2 for sat goal > 92%, currently on 3L - IS/ pulmonary hygiene  Afib with RVR> new Elevated troponin hs> likely demand - new onset, no prior hx of.  S/p cardioversion in ER, remains in NSR/ ST - EKG non acute - monitor for now, consider amio if recurrence - hold on [facility] for now given coagulapathy as below  - echo   - ideally goal K> 4, Mag> 2 - trend trop hs  Coagulapathy, elevated INR Thrombocytopenia - likely related to sepsis.  Pending DIC panel - repeat coags in am  HLD - hold crestor with transaminitis   Best Practice (right click and "Reselect all SmartList Selections" daily)   Diet/type: NPO DVT prophylaxis: SCD GI prophylaxis: PPI Lines: Arterial Line Foley:  Yes, and it is still needed Code Status:  full code Last date of multidisciplinary goals of care discussion [9/19]  Wife present at bedside, updated on plan of care.   Labs   CBC: Recent Labs  Lab 03/04/2022 1104 02/20/2022 1128  WBC 6.9  --   NEUTROABS 4.8  --   HGB 14.3 14.3  HCT 42.8 42.0  MCV 102.1*  --   PLT 30*  --     Basic  Metabolic Panel: Recent Labs  Lab 03/15/2022 1104 03/14/2022 1128 03/15/2022 1236  NA 136 134*  --   K 2.8* 2.8*  --   CL 99 101  --   CO2 14*  --   --   GLUCOSE 105* 96  --   BUN 61* 56*  --   CREATININE 4.86* 5.20*  --   CALCIUM 8.7*  --   --   MG  --   --  1.9   GFR: Estimated Creatinine Clearance: 15.6 mL/min (A) (by C-G formula based on SCr of 5.2 mg/dL (H)). Recent Labs  Lab 03/06/2022 1104 03/05/2022 1300  WBC 6.9  --   LATICACIDVEN >9.0* 8.9*  Liver Function Tests: Recent Labs  Lab 02/27/2022 1104  AST 95*  ALT 45*  ALKPHOS 520*  BILITOT 3.3*  PROT 5.0*  ALBUMIN 2.2*   No results for input(s): "LIPASE", "AMYLASE" in the last 168 hours. No results for input(s): "AMMONIA" in the last 168 hours.  ABG    Component Value Date/Time   TCO2 15 (L) 03/02/2022 1128     Coagulation Profile: Recent Labs  Lab 03/01/2022 1104  INR 1.9*    Cardiac Enzymes: No results for input(s): "CKTOTAL", "CKMB", "CKMBINDEX", "TROPONINI" in the last 168 hours.  HbA1C: Hgb A1c MFr Bld  Date/Time Value Ref Range Status  01/29/2022 03:04 PM 5.6 <5.7 % of total Hgb Final    Comment:    For the purpose of screening for the presence of diabetes: . <5.7%       Consistent with the absence of diabetes 5.7-6.4%    Consistent with increased risk for diabetes             (prediabetes) > or =6.5%  Consistent with diabetes . This assay result is consistent with a decreased risk of diabetes. . Currently, no consensus exists regarding use of hemoglobin A1c for diagnosis of diabetes in children. . According to American Diabetes Association (ADA) guidelines, hemoglobin A1c <7.0% represents optimal control in non-pregnant diabetic patients. Different metrics may apply to specific patient populations.  Standards of Medical Care in Diabetes(ADA). Marland Kitchen   04/30/2021 11:02 AM 5.3 4.8 - 5.6 % Final    Comment:    (NOTE) Pre diabetes:          5.7%-6.4%  Diabetes:               >6.4%  Glycemic control for   <7.0% adults with diabetes     CBG: Recent Labs  Lab 03/12/2022 1101 03/09/2022 1118  GLUCAP 116* 90    Review of Systems:   Review of Systems  Constitutional:  Positive for chills and malaise/fatigue. Negative for fever.  Respiratory:  Negative for cough and shortness of breath.   Cardiovascular:  Negative for chest pain, palpitations and leg swelling.  Gastrointestinal:  Positive for abdominal pain, diarrhea, nausea and vomiting.  Neurological:  Negative for focal weakness and headaches.   Past Medical History:  He,  has a past medical history of Arteriosclerosis of aorta ([place]), Arthritis, Basal cell carcinoma, GERD (gastroesophageal reflux disease), Hyperlipidemia, Labile hypertension, OA (osteoarthritis) of knee (11/25/2020), Other testicular hypofunction, Prediabetes, and Vitamin D deficiency.   Surgical History:   Past Surgical History:  Procedure Laterality Date   CATARACT EXTRACTION, BILATERAL Bilateral 2019   Dr. Carmell Austria   dental implant surgery      KNEE ARTHROSCOPY Left 1988   SKIN CANCER EXCISION     Ear, forehead, nose   TOTAL KNEE ARTHROPLASTY Left 11/25/2020   Procedure: TOTAL KNEE ARTHROPLASTY;  Surgeon: Gaynelle Arabian, MD;  Location: [facility];  Service: Orthopedics;  Laterality: Left;  54mn   TOTAL KNEE ARTHROPLASTY Right 05/05/2021   Procedure: TOTAL KNEE ARTHROPLASTY;  Surgeon: AGaynelle Arabian MD;  Location: [facility]S;  Service: Orthopedics;  Laterality: Right;     Social History:   reports that he quit smoking about 17 years ago. His smoking use included cigarettes. He started smoking about 53 years ago. He has a 55.50 pack-year smoking history. He has never used smokeless tobacco. He reports current alcohol use of about 2.0 - 3.0 standard drinks of alcohol per week. He reports that he does not use drugs.  Family History:  His family history includes Alzheimer's disease in his mother; Colon polyps in his father; Diabetes in  his mother; Heart disease in his father; Hypertension in his father; Stroke in his father.   Allergies Allergies  Allergen Reactions   Lipitor [Atorvastatin]     Cloudy headed    Lopid [Gemfibrozil]     Unknown reaction. Patient doesn't remember taking this medication.   Penicillins     Unknown childhood reaction  Tolerated Cephalosporin Date: 11/26/20.  Tolerated Cephalosporin Date: 05/06/21.       Home Medications  Prior to Admission medications   Medication Sig Start Date End Date Taking? Authorizing Provider  calcium carbonate (TUMS - DOSED IN MG ELEMENTAL CALCIUM) 500 MG chewable tablet Chew 1,000 mg by mouth daily as needed for indigestion or heartburn.    [provider]  citalopram (CELEXA) 20 MG tablet Take 1 tablet (20 mg total) by mouth daily. 07/16/21 07/16/22  Liane Comber, NP  Cyanocobalamin (B-12 PO) Take by mouth.    [provider]  rosuvastatin (CRESTOR) 5 MG tablet TAKE 1 TABLET BY MOUTH DAILY FOR CHOLESTEROL AND PLAQUE ON ARTERIES 12/02/21   Alycia Rossetti, NP  VITAMIN D, CHOLECALCIFEROL, PO Take 10,000 Units by mouth daily.    [provider]  zinc gluconate 50 MG tablet Take 50 mg by mouth daily.    [provider]     Critical care time: 54 mins       Kennieth Rad, MSN, AG-ACNP-BC  Pulmonary & Critical Care 03/01/2022, 3:23 PM  See Amion for pager If no response to pager, please call PCCM consult pager After 7:00 pm call [place]

## 2022-03-10 NOTE — Progress Notes (Signed)
Pt admitted with septic shock from cholecystitis.  Had perc drain by IR earlier today.  Has increased WOB and increasing pressor needs.  Reports feeling tired with his breathing.  Noted to have diaphoresis and mottling of his extremities.  Decided to proceed with intubation and central line placement.  Will f/u CXR and ABG.  Continue current broad spectrum antibiotics pending culture results.  Has elevated troponin and is in A fib with intermittent RVR.  Will check Echo.     Latest Ref Rng & Units 03/09/2022    9:21 PM 02/27/2022    4:20 PM 02/28/2022   11:28 AM  CMP  Glucose 70 - 99 mg/dL  121  96   BUN 8 - 23 mg/dL  63  56   Creatinine 0.61 - 1.24 mg/dL  4.95  5.20   Sodium 135 - 145 mmol/L 131  133  134   Potassium 3.5 - 5.1 mmol/L 3.7  3.0  2.8   Chloride 98 - 111 mmol/L  96  101   CO2 22 - 32 mmol/L  21    Calcium 8.9 - 10.3 mg/dL  8.4         Latest Ref Rng & Units 03/02/2022    9:21 PM 02/24/2022    2:54 PM 02/20/2022   11:28 AM  CBC  Hemoglobin 13.0 - 17.0 g/dL 12.6   14.3   Hematocrit 39.0 - 52.0 % 37.0   42.0   Platelets 150 - 400 K/uL  25      ABG    Component Value Date/Time   PHART 7.355 03/11/2022 2121   PCO2ART 31.5 (L) 02/24/2022 2121   PO2ART 86 02/24/2022 2121   HCO3 17.6 (L) 03/20/2022 2121   TCO2 19 (L) 03/14/2022 2121   ACIDBASEDEF 7.0 (H) 02/26/2022 2121   O2SAT 96 03/19/2022 2121    Updated pt's wife over the phone.  CC time 38 minutes  Chesley Mires, MD [place] Pager - [phone] 02/21/2022, 9:59 PM

## 2022-03-10 NOTE — Progress Notes (Signed)
eLink Physician-Brief Progress Note Patient Name: Eric [name] DOB: 09-10-1950 MRN: [id number]   Date of Service  02/26/2022  HPI/Events of Note  Afib rvr 140-160   eICU Interventions  Change levophed to phenylephrine  Start amiodarone drip     Intervention Category Major Interventions: Arrhythmia - evaluation and management  Mauri [place], P 03/09/2022, 11:34 PM

## 2022-03-10 NOTE — Consult Note (Signed)
Chief Complaint: Patient was seen in consultation today for acute cholecystitis  Referring Physician(s): Saverio Danker, PA-C  Supervising Physician: Aletta Edouard  Patient Status: [name]  History of Present Illness: Eric [name] is a 71 y.o. male with PMH significant for arthritis, GERD, hyperlipidemia, hypertension, prediabetes, COPD with emphysema, and aortic atherosclerosis who presented to [name] at [facility] with abdominal pain since Thursday 9/14. His wife was present in the room and provided majority of history. He began having RUQ on Thursday and developed nausea and vomiting. His pain has been increasing in severity since Thursday, and his wife stated he began "seeming confused over the past couple of days." Patient's wife states his last oral intake as some Gatorade around 10AM this morning.  Work up in the [name] revealed lactic acid of 8.9, troponin of 812, INR of 1.7, creatinine of 4.86, elevated LFTs with T bili of 3.3. Ultrasound and abdominal CT suggest the presence of acute cholecystitis.   Patient has been cardioverted once in the [name] for atrial fibrillation with RVR, and patient is currently tachycardic with rate ~104 at time of exam. Patient meets SIRS criteria.  Patient is currently receiving lactated ringers, cefepime, metronidazole, and vancomycin. Patient is currently on levophed 34mg/min.  Past Medical History:  Diagnosis Date   Arteriosclerosis of aorta (HCC)    Arthritis    Basal cell carcinoma    GERD (gastroesophageal reflux disease)    Hyperlipidemia    Labile hypertension    pt denies    OA (osteoarthritis) of knee 11/25/2020   Other testicular hypofunction    Prediabetes    (A1c 5.8% / 2013)   Vitamin D deficiency     Past Surgical History:  Procedure Laterality Date   CATARACT EXTRACTION, BILATERAL Bilateral 2019   Dr. ACarmell Austria  dental implant surgery      KNEE ARTHROSCOPY Left 1988   SKIN CANCER EXCISION     Ear, forehead, nose   TOTAL  KNEE ARTHROPLASTY Left 11/25/2020   Procedure: TOTAL KNEE ARTHROPLASTY;  Surgeon: AGaynelle Arabian MD;  Location: [facility];  Service: Orthopedics;  Laterality: Left;  542m   TOTAL KNEE ARTHROPLASTY Right 05/05/2021   Procedure: TOTAL KNEE ARTHROPLASTY;  Surgeon: AlGaynelle ArabianMD;  Location: [facility];  Service: Orthopedics;  Laterality: Right;    Allergies: Lipitor [atorvastatin], Lopid [gemfibrozil], and Penicillins  Medications: Prior to Admission medications   Medication Sig Start Date End Date Taking? Authorizing Provider  calcium carbonate (TUMS - DOSED IN MG ELEMENTAL CALCIUM) 500 MG chewable tablet Chew 1,000 mg by mouth daily as needed for indigestion or heartburn.    [provider]  citalopram (CELEXA) 20 MG tablet Take 1 tablet (20 mg total) by mouth daily. 07/16/21 07/16/22  CoLiane ComberNP  Cyanocobalamin (B-12 PO) Take by mouth.    [provider]  rosuvastatin (CRESTOR) 5 MG tablet TAKE 1 TABLET BY MOUTH DAILY FOR CHOLESTEROL AND PLAQUE ON ARTERIES 12/02/21   WiAlycia RossettiNP  VITAMIN D, CHOLECALCIFEROL, PO Take 10,000 Units by mouth daily.    [provider]  zinc gluconate 50 MG tablet Take 50 mg by mouth daily.    [provider]     Family History  Problem Relation Age of Onset   Diabetes Mother    Alzheimer's disease Mother    Heart disease Father    Hypertension Father    Stroke Father    Colon polyps Father     Social History  Socioeconomic History   Marital status: Married    Spouse name: Not on file   Number of children: Not on file   Years of education: Not on file   Highest education level: Not on file  Occupational History   Not on file  Tobacco Use   Smoking status: Former    Packs/day: 1.50    Years: 37.00    Total pack years: 55.50    Types: Cigarettes    Start date: 06/08/1968    Quit date: 10/10/2004    Years since quitting: 17.4   Smokeless tobacco: Never  Vaping Use   Vaping Use: Never used   Substance and Sexual Activity   Alcohol use: Yes    Alcohol/week: 2.0 - 3.0 standard drinks of alcohol    Types: 2 - 3 Standard drinks or equivalent per week   Drug use: No   Sexual activity: Not on file  Other Topics Concern   Not on file  Social History Narrative   Not on file   Social Determinants of Health   Financial Resource Strain: Not on file  Food Insecurity: Not on file  Transportation Needs: Not on file  Physical Activity: Not on file  Stress: Not on file  Social Connections: Not on file     Review of Systems: A 12 point ROS discussed and pertinent positives are indicated in the HPI above.  All other systems are negative.  Review of Systems  Constitutional:  Positive for chills and diaphoresis. Negative for fever.  Respiratory:  Negative for chest tightness and shortness of breath.   Cardiovascular:  Negative for chest pain, palpitations and leg swelling.  Gastrointestinal:  Positive for abdominal pain, diarrhea, nausea and vomiting.  Genitourinary:  Positive for difficulty urinating.  Neurological:  Negative for dizziness, light-headedness and headaches.  Psychiatric/Behavioral:  Positive for confusion.     Vital Signs: BP (!) 75/55   Pulse (!) 49   Resp (!) 30   Ht 6' (1.829 m)   Wt 210 lb (95.3 kg)   SpO2 (!) 64%   BMI 28.48 kg/m    Physical Exam Vitals reviewed.  Constitutional:      General: He is in acute distress.     Appearance: He is toxic-appearing and diaphoretic.  HENT:     Head: Normocephalic.     Mouth/Throat:     Mouth: Mucous membranes are moist.  Cardiovascular:     Rate and Rhythm: Tachycardia present. Rhythm irregular.     Heart sounds: Normal heart sounds.     Comments: Weak peripheral pulses in extremities Pulmonary:     Comments: Tachypneic with shallow respirations  Abdominal:     General: There is distension.     Tenderness: There is abdominal tenderness. There is guarding.     Comments: Slight guarding present.  Hypoactive bowel sounds  Musculoskeletal:     Right lower leg: No edema.     Left lower leg: No edema.  Skin:    General: Skin is warm.  Neurological:     Comments: Oriented to person and place, but has difficulty remembering events leading to his [name] visit.      Imaging: US Abdomen Limited RUQ (LIVER/GB)  Result Date: 03/20/2022 CLINICAL DATA:  Right upper quadrant pain. EXAM: ULTRASOUND ABDOMEN LIMITED RIGHT UPPER QUADRANT COMPARISON:  CT abdomen and pelvis 03/07/2022 FINDINGS: Gallbladder: The gallbladder wall is again thickened, measuring up to 7 mm. The sonographer reports a positive sonographic Murphy's sign" with patient pain when the probe  is pressed on the gallbladder. There is again mild pericholecystic fluid. No gallstone is seen. Common bile duct: Diameter: 5 mm, within normal limits. Liver: There is a hypoechoic region with lobular margins just anterior and superior to the gallbladder corresponding to the dense fluid region on today's CT. This measures up to approximately 4.5 x 3.0 x 4.2 cm. There is heterogeneous hepatic echotexture. Portal vein is patent on color Doppler imaging with normal direction of blood flow towards the liver. Other: None. IMPRESSION: 1. There is again gallbladder wall thickening and pericholecystic fluid. Positive sonographic Murphy's sign. Findings suggest acalculous cholecystitis. Note is made that there was primary or secondary inflammatory change within the adjacent hepatic flexure of the colon on today's CT. It is unclear whether the primary cause of the inflammation is the gallbladder, colon, or both. 2. Mildly complex fluid collection adjacent to the gallbladder fossa as seen on CT. Differential considerations again include a possible complex cyst or abscess. Electronically Signed   By: Yvonne Kendall M.D.   On: 03/04/2022 15:19   CT ABDOMEN PELVIS W CONTRAST  Result Date: 03/05/2022 CLINICAL DATA:  Abdominal pain, acute, nonlocalized EXAM: CT ABDOMEN AND  PELVIS WITH CONTRAST TECHNIQUE: Multidetector CT imaging of the abdomen and pelvis was performed using the standard protocol following bolus administration of intravenous contrast. RADIATION DOSE REDUCTION: This exam was performed according to the departmental dose-optimization program which includes automated exposure control, adjustment of the mA and/or kV according to patient size and/or use of iterative reconstruction technique. CONTRAST:  57m OMNIPAQUE IOHEXOL 350 MG/ML SOLN COMPARISON:  None Available. FINDINGS: Lower chest: Bibasilar subsegmental atelectasis. Heart size within normal limits. Gynecomastia is evident on the right. Hepatobiliary: Abnormal appearance of the gallbladder which is moderately dilated with wall thickening, pericholecystic fluid, and pericholecystic fat stranding. No hyperdense gallstone. There is an irregular hypoattenuating lesion within the liver adjacent to the gallbladder fossa measuring approximately 4.4 x 2.5 x 3.6 cm with internal density slightly greater than fluid. Liver appears otherwise unremarkable. No intrahepatic biliary dilatation. Pancreas: Unremarkable. No pancreatic ductal dilatation or surrounding inflammatory changes. Spleen: Normal in size without focal abnormality. Adrenals/Urinary Tract: Unremarkable adrenal glands. Kidneys enhance symmetrically without solid lesion, stone, or hydronephrosis. Ureters are nondilated. Urinary bladder is decompressed by Foley catheter. Stomach/Bowel: Long segment wall thickening of the colon centered at the hepatic flexure, which may be reactive secondary to local inflammation associated with the gallbladder. Sigmoid diverticulosis. Normal appendix in the right lower quadrant. No dilated loops of bowel. Small hiatal hernia. Vascular/Lymphatic: Scattered aortoiliac atherosclerotic calcifications without aneurysm. No abdominopelvic lymphadenopathy. Reproductive: Prostate is unremarkable. Other: No ascites. No abdominopelvic fluid  collection. No pneumoperitoneum. No abdominal wall hernia. Musculoskeletal: No acute or significant osseous findings. IMPRESSION: 1. Appearance of the gallbladder is highly suspicious for acute cholecystitis. Consider further evaluation with right upper quadrant ultrasound. 2. Irregular low-density lesion within the liver adjacent to the gallbladder fossa measuring up to 4.4 cm. This could represent a hepatic abscess, complex cyst, or neoplasm. 3. Long segment wall thickening of the colon centered at the hepatic flexure, which may be reactive secondary to local inflammation associated with the gallbladder versus a nonspecific colitis. 4. Sigmoid diverticulosis without evidence of acute diverticulitis. 5. Aortic Atherosclerosis (ICD10-I70.0). Electronically Signed   By: NDavina PokeD.O.   On: 03/07/2022 14:02   CT Angio Chest PE W and/or Wo Contrast  Result Date: 02/23/2022 CLINICAL DATA:  Chest pain. EXAM: CT ANGIOGRAPHY CHEST WITH CONTRAST TECHNIQUE: Multidetector CT imaging of the chest  was performed using the standard protocol during bolus administration of intravenous contrast. Multiplanar CT image reconstructions and MIPs were obtained to evaluate the vascular anatomy. RADIATION DOSE REDUCTION: This exam was performed according to the departmental dose-optimization program which includes automated exposure control, adjustment of the mA and/or kV according to patient size and/or use of iterative reconstruction technique. CONTRAST:  97m OMNIPAQUE IOHEXOL 350 MG/ML SOLN COMPARISON:  June 28, 2018. FINDINGS: Cardiovascular: Satisfactory opacification of the pulmonary arteries to the segmental level. No evidence of pulmonary embolism. Normal heart size. No pericardial effusion. Mediastinum/Nodes: No enlarged mediastinal, hilar, or axillary lymph nodes. Thyroid gland, trachea, and esophagus demonstrate no significant findings. Lungs/Pleura: Lungs are clear. No pleural effusion or pneumothorax. Upper  Abdomen: No acute abnormality. Musculoskeletal: No chest wall abnormality. No acute or significant osseous findings. Review of the MIP images confirms the above findings. IMPRESSION: No definite evidence of pulmonary embolus. No acute abnormality seen in the chest. Electronically Signed   By: JMarijo ConceptionM.D.   On: 02/22/2022 13:58   CT Head Wo Contrast  Result Date: 03/08/2022 CLINICAL DATA:  Head trauma. EXAM: CT HEAD WITHOUT CONTRAST TECHNIQUE: Contiguous axial images were obtained from the base of the skull through the vertex without intravenous contrast. RADIATION DOSE REDUCTION: This exam was performed according to the departmental dose-optimization program which includes automated exposure control, adjustment of the mA and/or kV according to patient size and/or use of iterative reconstruction technique. COMPARISON:  None Available. FINDINGS: Brain: No evidence of acute infarction, hemorrhage, hydrocephalus, extra-axial collection or mass lesion/mass effect. There are sequela of moderate chronic microvascular ischemic change with advanced generalized volume loss. Vascular: No hyperdense vessel or unexpected calcification. Skull: Normal. Negative for fracture or focal lesion. Sinuses/Orbits: Bilateral lens replacement. Mild mucosal thickening bilateral maxillary and ethmoid sinuses, as well as right sphenoid sinus. Other: None. IMPRESSION: No CT evidence of acute intracranial injury. Electronically Signed   By: HMarin RobertsM.D.   On: 02/22/2022 13:51   DG Chest Port 1 View  Result Date: 03/16/2022 CLINICAL DATA:  Provided history: Questionable sepsis-evaluate for abnormality. EXAM: PORTABLE CHEST 1 VIEW COMPARISON:  Prior chest radiographs 09/12/2020 and earlier. FINDINGS: Heart size within normal limits. Aortic atherosclerosis. Minimal atelectasis within the right lung base. No appreciable airspace consolidation or pulmonary edema. No evidence of pleural effusion or pneumothorax. No acute bony  abnormality identified. IMPRESSION: Minimal atelectasis within the right lung base. Otherwise, no evidence of acute cardiopulmonary abnormality. Aortic Atherosclerosis (ICD10-I70.0). Electronically Signed   By: KKellie SimmeringD.O.   On: 03/07/2022 12:23   MM DIAG BREAST TOMO BILATERAL  Result Date: 02/12/2022 CLINICAL DATA:  Tender right breast lump EXAM: DIGITAL DIAGNOSTIC BILATERAL MAMMOGRAM WITH TOMOSYNTHESIS TECHNIQUE: Bilateral digital diagnostic mammography and breast tomosynthesis was performed. COMPARISON:  Previous exam(s). ACR Breast Density Category b: There are scattered areas of fibroglandular density. FINDINGS: The patient has significant right-sided gynecomastia. There is minimal gynecomastia on the left. No other abnormalities. IMPRESSION: The patient's symptoms are due to right-sided gynecomastia. RECOMMENDATION: Treatment of the patient's gynecomastia should be based on clinical and physical exam. I have discussed the findings and recommendations with the patient. If applicable, a reminder letter will be sent to the patient regarding the next appointment. BI-RADS CATEGORY  2: Benign. Electronically Signed   By: DDorise BullionIII M.D.   On: 02/12/2022 09:47   Labs:  CBC: Recent Labs    05/06/21 0324 07/16/21 1140 01/29/22 1504 03/19/2022 1104 03/21/2022 1128  WBC 12.5* 6.5 6.6  6.9  --   HGB 12.8* 16.5 16.5 14.3 14.3  HCT 37.7* 48.0 47.5 42.8 42.0  PLT 228 273 286 30*  --     COAGS: Recent Labs    04/09/21 1423 03/03/2022 1104  INR 1.1 1.9*  APTT  --  42*    BMP: Recent Labs    05/06/21 0324 07/16/21 1140 01/29/22 1504 02/21/2022 1104 03/07/2022 1128  NA 135 137 139 136 134*  K 4.3 4.7 4.4 2.8* 2.8*  CL 105 101 101 99 101  CO2 '24 30 27 '$ 14*  --   GLUCOSE 135* 107* 91 105* 96  BUN '15 13 15 '$ 61* 56*  CALCIUM 8.7* 9.9 10.0 8.7*  --   CREATININE 1.05 1.04 1.18 4.86* 5.20*  GFRNONAA >60  --   --  12*  --     LIVER FUNCTION TESTS: Recent Labs    04/09/21 1423  07/16/21 1140 01/29/22 1504 03/17/2022 1104  BILITOT 0.8 0.7 0.8 3.3*  AST '22 23 19 '$ 95*  ALT '27 26 27 '$ 45*  ALKPHOS  --   --   --  520*  PROT 6.7 7.2 7.1 5.0*  ALBUMIN  --   --   --  2.2*     Assessment and Plan:  Baker Moronta is an acutely ill 71 year old male presenting with sepsis secondary to acute cholecystitis. Patient seen in [name] for emergent percutaneous cholecystostomy drain placement following surgical team deeming him unstable for surgery. Dr Kathlene Cote has reviewed the case and approved for emergent image-guided percutaneous cholecystostomy placement. Due to concerns about the patient's status, plan is to use IV fentanyl for pain control during procedure, as patient is not felt to be a good candidate for sedation in current status.   Risks and benefits discussed with the patient and his wife including, but not limited to bleeding, infection, gallbladder perforation, bile leak, sepsis or even death. These risks were discussed extensively with the patient's wife due to the high risk status of the patient, especially with an elevated INR and low platelet count.  All of the patient's and his wife's questions were answered, patient is agreeable to proceed. Consent signed and in chart.   Thank you for this interesting consult.  I greatly enjoyed meeting Eric [name] and look forward to participating in their care.  A copy of this report was sent to the requesting provider on this date.  Electronically Signed: Lura Em, PA-C 02/25/2022, 3:30 PM   I spent a total of 40 Minutes    in face to face in clinical consultation, greater than 50% of which was counseling/coordinating care for acute cholecystitis

## 2022-03-10 NOTE — ED Notes (Signed)
Lab called critical Troponin 812 '@1510'$ , Dr

## 2022-03-10 NOTE — ED Provider Notes (Signed)
Considered the risks and benefits of CT with IV contrast given the patient's renal function and feel that benefits of diagnostic capabilities outweigh risk of kidney injury at this time.  We will continue to rehydrate the patient with fluids to prevent worsening renal failure.   Fransico Meadow, MD 03/18/2022 1311

## 2022-03-10 NOTE — ED Notes (Signed)
Lab called critical Lactic acid >9.0. Dr Margaretmary Eddy was made aware.

## 2022-03-10 NOTE — Sepsis Progress Note (Signed)
Confirmed with bedside RN that antibiotic was given before the blood cultures were drawn. Awaiting confirmation whether an attempt at blood cultures was made.

## 2022-03-10 NOTE — Procedures (Signed)
Interventional Radiology Procedure Note  Procedure: Percutaneous Cholecystostomy  Complications: None  Estimated Blood Loss: < 10 mL  Findings: GB with purulent bile. Sample sent for culture. 10 Fr perc choly tube placed in GB lumen and placed to gravity bag drainage. Small collection in liver near GB fossa visible by [place] and too small to aspirate/drain.  Venetia Night. Kathlene Cote, M.D Pager:  [phone]

## 2022-03-10 NOTE — Progress Notes (Signed)
Per Lolita Patella, RN patient no longer has pressors running therefore IV team consult for USGPIV for pressors can be canceled.

## 2022-03-10 NOTE — Procedures (Signed)
Arterial Catheter Insertion Procedure Note  Eric [name]  [id number]  1951-05-10  Date:02/21/2022  Time:12:25 PM    Provider Performing: Judith Part    Procedure: Insertion of Arterial Line [phone]) without US guidance  Indication(s) Blood pressure monitoring and/or need for frequent ABGs  Consent Risks of the procedure as well as the alternatives and risks of each were explained to the patient and/or caregiver.  Consent for the procedure was obtained and is signed in the bedside chart  Anesthesia None   Time Out Verified patient identification, verified procedure, site/side was marked, verified correct patient position, special equipment/implants available, medications/allergies/relevant history reviewed, required imaging and test results available.   Sterile Technique Maximal sterile technique including full sterile barrier drape, hand hygiene, sterile gown, sterile gloves, mask, hair covering, sterile ultrasound probe cover (if used).   Procedure Description Area of catheter insertion was cleaned with chlorhexidine and draped in sterile fashion. Without real-time ultrasound guidance an arterial catheter was placed into the right radial artery.  Appropriate arterial tracings confirmed on monitor.     Complications/Tolerance None; patient tolerated the procedure well.   EBL Minimal   Specimen(s) None

## 2022-03-10 NOTE — ED Notes (Signed)
Got patient on the monitor did ekg shown to er provider

## 2022-03-10 NOTE — ED Notes (Signed)
Called [facility] in IR and made aware of platelet count

## 2022-03-10 NOTE — Procedures (Addendum)
Central Venous Catheter Insertion Procedure Note  Eric [name]  [id number]  24-Nov-1950  Date:03/07/2022  Time:10:15 PM   Provider Performing:Delmo Matty R Ellamarie Naeve   Procedure: Insertion of Non-tunneled Central Venous [phone]) with US guidance ([id number])   Indication(s) Medication administration  Consent Risks of the procedure as well as the alternatives and risks of each were explained to the patient and/or caregiver.  Consent for the procedure was obtained and is signed in the bedside chart  Anesthesia Topical only with 1% lidocaine   Timeout Verified patient identification, verified procedure, site/side was marked, verified correct patient position, special equipment/implants available, medications/allergies/relevant history reviewed, required imaging and test results available.  Sterile Technique Maximal sterile technique including full sterile barrier drape, hand hygiene, sterile gown, sterile gloves, mask, hair covering, sterile ultrasound probe cover (if used).  Procedure Description Area of catheter insertion was cleaned with chlorhexidine and draped in sterile fashion.  With real-time ultrasound guidance a central venous catheter was placed into the left internal jugular vein. Nonpulsatile blood flow and easy flushing noted in all ports.  The catheter was sutured in place and sterile dressing applied.  Complications/Tolerance None; patient tolerated the procedure well. Chest X-ray is ordered to verify placement for internal jugular or subclavian cannulation.   Chest x-ray is not ordered for femoral cannulation.  EBL Minimal  Specimen(s) None      Otilio Carpen Porfirio Bollier, PA-C

## 2022-03-10 NOTE — Sepsis Progress Note (Signed)
eLink is following this Code Sepsis. °

## 2022-03-10 NOTE — Progress Notes (Addendum)
Please call report. Chart reviewed. [phone]

## 2022-03-10 NOTE — ED Notes (Signed)
Lab called critical Troponin 812, Dr Sharlett Iles from the emergency room was made aware.

## 2022-03-10 NOTE — ED Notes (Signed)
Pt noted to have red spots under tongue. Provider aware.

## 2022-03-10 NOTE — Code Documentation (Signed)
Cardioverted at [facility]... RT and MD remain at bedside Remains in A.fib now at rate in 90s

## 2022-03-11 ENCOUNTER — Inpatient Hospital Stay (HOSPITAL_COMMUNITY): Payer: PPO

## 2022-03-11 DIAGNOSIS — R778 Other specified abnormalities of plasma proteins: Secondary | ICD-10-CM

## 2022-03-11 DIAGNOSIS — I4891 Unspecified atrial fibrillation: Secondary | ICD-10-CM | POA: Diagnosis not present

## 2022-03-11 DIAGNOSIS — A419 Sepsis, unspecified organism: Secondary | ICD-10-CM | POA: Diagnosis not present

## 2022-03-11 DIAGNOSIS — R6521 Severe sepsis with septic shock: Secondary | ICD-10-CM | POA: Diagnosis not present

## 2022-03-11 LAB — COOXEMETRY PANEL
Carboxyhemoglobin: 1.3 % (ref 0.5–1.5)
Carboxyhemoglobin: 1.2 % (ref 0.5–1.5)
Methemoglobin: <0.7 % (ref 0.0–1.5)
Methemoglobin: 1.1 % (ref 0.0–1.5)
O2 Saturation: 100 %
O2 Saturation: 73.2 %
Total hemoglobin: 12.5 g/dL (ref 12.0–16.0)
Total hemoglobin: 12.5 g/dL (ref 12.0–16.0)

## 2022-03-11 LAB — COMPREHENSIVE METABOLIC PANEL
ALT: 74 U/L — ABNORMAL HIGH (ref 0–44)
AST: 198 U/L — ABNORMAL HIGH (ref 15–41)
Albumin: 1.8 g/dL — ABNORMAL LOW (ref 3.5–5.0)
Alkaline Phosphatase: 198 U/L — ABNORMAL HIGH (ref 38–126)
Anion gap: 20 — ABNORMAL HIGH (ref 5–15)
BUN: 68 mg/dL — ABNORMAL HIGH (ref 8–23)
CO2: 12 mmol/L — ABNORMAL LOW (ref 22–32)
Calcium: 8 mg/dL — ABNORMAL LOW (ref 8.9–10.3)
Chloride: 101 mmol/L (ref 98–111)
Creatinine, Ser: 5.45 mg/dL — ABNORMAL HIGH (ref 0.61–1.24)
GFR, Estimated: 11 mL/min — ABNORMAL LOW (ref >60)
Glucose, Bld: 149 mg/dL — ABNORMAL HIGH (ref 70–99)
Potassium: 4.7 mmol/L (ref 3.5–5.1)
Sodium: 133 mmol/L — ABNORMAL LOW (ref 135–145)
Total Bilirubin: 3.8 mg/dL — ABNORMAL HIGH (ref 0.3–1.2)
Total Protein: 4.6 g/dL — ABNORMAL LOW (ref 6.5–8.1)

## 2022-03-11 LAB — BASIC METABOLIC PANEL
Anion gap: 21 — ABNORMAL HIGH (ref 5–15)
BUN: 68 mg/dL — ABNORMAL HIGH (ref 8–23)
CO2: 16 mmol/L — ABNORMAL LOW (ref 22–32)
Calcium: 6.5 mg/dL — ABNORMAL LOW (ref 8.9–10.3)
Chloride: 93 mmol/L — ABNORMAL LOW (ref 98–111)
Creatinine, Ser: 5.89 mg/dL — ABNORMAL HIGH (ref 0.61–1.24)
GFR, Estimated: 10 mL/min — ABNORMAL LOW (ref >60)
Glucose, Bld: 241 mg/dL — ABNORMAL HIGH (ref 70–99)
Potassium: 5 mmol/L (ref 3.5–5.1)
Sodium: 130 mmol/L — ABNORMAL LOW (ref 135–145)

## 2022-03-11 LAB — CBC
HCT: 38.5 % — ABNORMAL LOW (ref 39.0–52.0)
HCT: 35.4 % — ABNORMAL LOW (ref 39.0–52.0)
Hemoglobin: 13.4 g/dL (ref 13.0–17.0)
Hemoglobin: 12 g/dL — ABNORMAL LOW (ref 13.0–17.0)
MCH: 34.8 pg — ABNORMAL HIGH (ref 26.0–34.0)
MCH: 34.2 pg — ABNORMAL HIGH (ref 26.0–34.0)
MCHC: 34.8 g/dL (ref 30.0–36.0)
MCHC: 33.9 g/dL (ref 30.0–36.0)
MCV: 100 fL (ref 80.0–100.0)
MCV: 100.9 fL — ABNORMAL HIGH (ref 80.0–100.0)
Platelets: 28 10*3/uL — CL (ref 150–400)
Platelets: 24 10*3/uL — CL (ref 150–400)
RBC: 3.85 MIL/uL — ABNORMAL LOW (ref 4.22–5.81)
RBC: 3.51 MIL/uL — ABNORMAL LOW (ref 4.22–5.81)
RDW: 14.2 % (ref 11.5–15.5)
RDW: 14.6 % (ref 11.5–15.5)
WBC: 25.5 10*3/uL — ABNORMAL HIGH (ref 4.0–10.5)
WBC: 24.3 10*3/uL — ABNORMAL HIGH (ref 4.0–10.5)
nRBC: 0 % (ref 0.0–0.2)
nRBC: 0.1 % (ref 0.0–0.2)

## 2022-03-11 LAB — POCT I-STAT 7, (LYTES, BLD GAS, ICA,H+H)
Acid-base deficit: 9 mmol/L — ABNORMAL HIGH (ref 0.0–2.0)
Acid-base deficit: 14 mmol/L — ABNORMAL HIGH (ref 0.0–2.0)
Acid-base deficit: 7 mmol/L — ABNORMAL HIGH (ref 0.0–2.0)
Bicarbonate: 16.4 mmol/L — ABNORMAL LOW (ref 20.0–28.0)
Bicarbonate: 10.5 mmol/L — ABNORMAL LOW (ref 20.0–28.0)
Bicarbonate: 16.2 mmol/L — ABNORMAL LOW (ref 20.0–28.0)
Calcium, Ion: 1.09 mmol/L — ABNORMAL LOW (ref 1.15–1.40)
Calcium, Ion: 1.01 mmol/L — ABNORMAL LOW (ref 1.15–1.40)
Calcium, Ion: 0.84 mmol/L — CL (ref 1.15–1.40)
HCT: 36 % — ABNORMAL LOW (ref 39.0–52.0)
HCT: 38 % — ABNORMAL LOW (ref 39.0–52.0)
HCT: 34 % — ABNORMAL LOW (ref 39.0–52.0)
Hemoglobin: 12.2 g/dL — ABNORMAL LOW (ref 13.0–17.0)
Hemoglobin: 12.9 g/dL — ABNORMAL LOW (ref 13.0–17.0)
Hemoglobin: 11.6 g/dL — ABNORMAL LOW (ref 13.0–17.0)
O2 Saturation: 98 %
O2 Saturation: 99 %
O2 Saturation: 97 %
Patient temperature: 100.7
Patient temperature: 99.3
Patient temperature: 101.5
Potassium: 4.1 mmol/L (ref 3.5–5.1)
Potassium: 4.9 mmol/L (ref 3.5–5.1)
Potassium: 4.8 mmol/L (ref 3.5–5.1)
Sodium: 129 mmol/L — ABNORMAL LOW (ref 135–145)
Sodium: 130 mmol/L — ABNORMAL LOW (ref 135–145)
Sodium: 127 mmol/L — ABNORMAL LOW (ref 135–145)
TCO2: 17 mmol/L — ABNORMAL LOW (ref 22–32)
TCO2: 11 mmol/L — ABNORMAL LOW (ref 22–32)
TCO2: 17 mmol/L — ABNORMAL LOW (ref 22–32)
pCO2 arterial: 33.6 mmHg (ref 32–48)
pCO2 arterial: 23 mmHg — ABNORMAL LOW (ref 32–48)
pCO2 arterial: 27.3 mmHg — ABNORMAL LOW (ref 32–48)
pH, Arterial: 7.303 — ABNORMAL LOW (ref 7.35–7.45)
pH, Arterial: 7.27 — ABNORMAL LOW (ref 7.35–7.45)
pH, Arterial: 7.388 (ref 7.35–7.45)
pO2, Arterial: 117 mmHg — ABNORMAL HIGH (ref 83–108)
pO2, Arterial: 139 mmHg — ABNORMAL HIGH (ref 83–108)
pO2, Arterial: 101 mmHg (ref 83–108)

## 2022-03-11 LAB — LIPASE, BLOOD: Lipase: 26 U/L (ref 11–51)

## 2022-03-11 LAB — MRSA NEXT GEN BY PCR, NASAL: MRSA by PCR Next Gen: NOT DETECTED

## 2022-03-11 LAB — URINE CULTURE: Culture: NO GROWTH

## 2022-03-11 LAB — LACTIC ACID, PLASMA
Lactic Acid, Venous: 4.2 mmol/L (ref 0.5–1.9)
Lactic Acid, Venous: >9 mmol/L (ref 0.5–1.9)
Lactic Acid, Venous: >9 mmol/L (ref 0.5–1.9)
Lactic Acid, Venous: >9 mmol/L (ref 0.5–1.9)
Lactic Acid, Venous: 8.4 mmol/L (ref 0.5–1.9)
Lactic Acid, Venous: 6.4 mmol/L (ref 0.5–1.9)

## 2022-03-11 LAB — BLOOD GAS, VENOUS
Acid-base deficit: 12.5 mmol/L — ABNORMAL HIGH (ref 0.0–2.0)
Bicarbonate: 14.3 mmol/L — ABNORMAL LOW (ref 20.0–28.0)
O2 Saturation: 69.4 %
Patient temperature: 37
pCO2, Ven: 35 mmHg — ABNORMAL LOW (ref 44–60)
pH, Ven: 7.22 — ABNORMAL LOW (ref 7.25–7.43)
pO2, Ven: 42 mmHg (ref 32–45)

## 2022-03-11 LAB — ECHOCARDIOGRAM COMPLETE
Area-P 1/2: 3.48 cm2
Height: 72 in
MV M vel: 1.15 m/s
MV Peak grad: 5.3 mmHg
S' Lateral: 2.52 cm
Weight: 3360 oz

## 2022-03-11 LAB — PROTIME-INR
INR: 1.9 — ABNORMAL HIGH (ref 0.8–1.2)
Prothrombin Time: 21.9 seconds — ABNORMAL HIGH (ref 11.4–15.2)

## 2022-03-11 LAB — GLUCOSE, CAPILLARY
Glucose-Capillary: 143 mg/dL — ABNORMAL HIGH (ref 70–99)
Glucose-Capillary: 46 mg/dL — ABNORMAL LOW (ref 70–99)
Glucose-Capillary: 68 mg/dL — ABNORMAL LOW (ref 70–99)
Glucose-Capillary: 141 mg/dL — ABNORMAL HIGH (ref 70–99)
Glucose-Capillary: 156 mg/dL — ABNORMAL HIGH (ref 70–99)
Glucose-Capillary: 218 mg/dL — ABNORMAL HIGH (ref 70–99)
Glucose-Capillary: 247 mg/dL — ABNORMAL HIGH (ref 70–99)
Glucose-Capillary: 256 mg/dL — ABNORMAL HIGH (ref 70–99)

## 2022-03-11 LAB — MAGNESIUM: Magnesium: 2.5 mg/dL — ABNORMAL HIGH (ref 1.7–2.4)

## 2022-03-11 LAB — TRIGLYCERIDES: Triglycerides: 516 mg/dL — ABNORMAL HIGH (ref <150)

## 2022-03-11 LAB — BRAIN NATRIURETIC PEPTIDE: B Natriuretic Peptide: 1712.5 pg/mL — ABNORMAL HIGH (ref 0.0–100.0)

## 2022-03-11 MED ORDER — SODIUM BICARBONATE 8.4 % IV SOLN
50.0000 meq | Freq: Once | INTRAVENOUS | Status: AC
  Administered 2022-03-11: 50 meq via INTRAVENOUS

## 2022-03-11 MED ORDER — NOREPINEPHRINE 4 MG/250ML-% IV SOLN
0.0000 ug/min | INTRAVENOUS | Status: DC
  Administered 2022-03-11: 40 ug/min via INTRAVENOUS
  Administered 2022-03-11: 35 ug/min via INTRAVENOUS
  Administered 2022-03-11: 40 ug/min via INTRAVENOUS
  Administered 2022-03-11: 34 ug/min via INTRAVENOUS
  Administered 2022-03-11: 30 ug/min via INTRAVENOUS
  Administered 2022-03-11: 32 ug/min via INTRAVENOUS
  Administered 2022-03-12: 22 ug/min via INTRAVENOUS
  Administered 2022-03-12: 28 ug/min via INTRAVENOUS
  Administered 2022-03-12: 22 ug/min via INTRAVENOUS
  Administered 2022-03-12: 17 ug/min via INTRAVENOUS
  Administered 2022-03-12 (×2): 28 ug/min via INTRAVENOUS
  Administered 2022-03-12: 33 ug/min via INTRAVENOUS
  Administered 2022-03-12: 18 ug/min via INTRAVENOUS
  Administered 2022-03-13: 15 ug/min via INTRAVENOUS
  Administered 2022-03-13: 25 ug/min via INTRAVENOUS
  Administered 2022-03-13: 17 ug/min via INTRAVENOUS
  Filled 2022-03-11 (×3): qty 250
  Filled 2022-03-11 (×2): qty 500
  Filled 2022-03-11 (×5): qty 250
  Filled 2022-03-11: qty 500
  Filled 2022-03-11 (×4): qty 250

## 2022-03-11 MED ORDER — CALCIUM GLUCONATE-NACL 1-0.675 GM/50ML-% IV SOLN
1.0000 g | Freq: Once | INTRAVENOUS | Status: AC
  Administered 2022-03-11: 1000 mg via INTRAVENOUS
  Filled 2022-03-11: qty 50

## 2022-03-11 MED ORDER — INSULIN ASPART 100 UNIT/ML IJ SOLN
0.0000 [IU] | INTRAMUSCULAR | Status: DC
  Administered 2022-03-11: 5 [IU] via SUBCUTANEOUS
  Administered 2022-03-11 – 2022-03-12 (×3): 3 [IU] via SUBCUTANEOUS
  Administered 2022-03-12 (×2): 2 [IU] via SUBCUTANEOUS
  Administered 2022-03-12: 3 [IU] via SUBCUTANEOUS
  Administered 2022-03-12 (×2): 2 [IU] via SUBCUTANEOUS
  Administered 2022-03-13 (×2): 1 [IU] via SUBCUTANEOUS

## 2022-03-11 MED ORDER — ACETAMINOPHEN 160 MG/5ML PO SOLN
650.0000 mg | Freq: Once | ORAL | Status: AC
  Administered 2022-03-11: 650 mg
  Filled 2022-03-11: qty 20.3

## 2022-03-11 MED ORDER — HEPARIN SODIUM (PORCINE) 1000 UNIT/ML IJ SOLN
INTRAMUSCULAR | Status: AC
  Administered 2022-03-11: 3000 [IU]
  Filled 2022-03-11: qty 4

## 2022-03-11 MED ORDER — LACTATED RINGERS IV BOLUS
1000.0000 mL | Freq: Once | INTRAVENOUS | Status: AC
  Administered 2022-03-11: 1000 mL via INTRAVENOUS

## 2022-03-11 MED ORDER — PHENYLEPHRINE CONCENTRATED 100MG/250ML (0.4 MG/ML) INFUSION SIMPLE
0.0000 ug/min | INTRAVENOUS | Status: DC
  Administered 2022-03-11: 400 ug/min via INTRAVENOUS
  Filled 2022-03-11 (×2): qty 250

## 2022-03-11 MED ORDER — DEXMEDETOMIDINE HCL IN NACL 400 MCG/100ML IV SOLN
0.4000 ug/kg/h | INTRAVENOUS | Status: DC
  Administered 2022-03-11 – 2022-03-12 (×4): 0.4 ug/kg/h via INTRAVENOUS
  Administered 2022-03-12: 0.5 ug/kg/h via INTRAVENOUS
  Filled 2022-03-11 (×5): qty 100

## 2022-03-11 MED ORDER — DEXTROSE 50 % IV SOLN
12.5000 g | INTRAVENOUS | Status: AC
  Administered 2022-03-11: 12.5 g via INTRAVENOUS
  Filled 2022-03-11: qty 50

## 2022-03-11 MED ORDER — PERFLUTREN LIPID MICROSPHERE
1.0000 mL | INTRAVENOUS | Status: AC | PRN
  Administered 2022-03-11: 3 mL via INTRAVENOUS

## 2022-03-11 MED ORDER — LACTATED RINGERS IV BOLUS
500.0000 mL | Freq: Once | INTRAVENOUS | Status: AC
  Administered 2022-03-11: 500 mL via INTRAVENOUS

## 2022-03-11 MED ORDER — EPINEPHRINE HCL 5 MG/250ML IV SOLN IN NS
0.5000 ug/min | INTRAVENOUS | Status: DC
  Filled 2022-03-11: qty 250

## 2022-03-11 MED ORDER — SODIUM BICARBONATE 8.4 % IV SOLN
INTRAVENOUS | Status: DC
  Filled 2022-03-11 (×5): qty 1000

## 2022-03-11 MED ORDER — FENTANYL 2500MCG IN NS 250ML (10MCG/ML) PREMIX INFUSION
0.0000 ug/h | INTRAVENOUS | Status: DC
  Administered 2022-03-11: 25 ug/h via INTRAVENOUS
  Administered 2022-03-12 (×2): 100 ug/h via INTRAVENOUS
  Administered 2022-03-13: 150 ug/h via INTRAVENOUS
  Filled 2022-03-11 (×4): qty 250

## 2022-03-11 MED ORDER — SODIUM BICARBONATE 8.4 % IV SOLN
INTRAVENOUS | Status: AC
  Administered 2022-03-11: 50 meq
  Filled 2022-03-11: qty 100

## 2022-03-11 MED ORDER — HYDROCORTISONE SOD SUC (PF) 100 MG IJ SOLR
100.0000 mg | Freq: Two times a day (BID) | INTRAMUSCULAR | Status: DC
  Administered 2022-03-11 – 2022-03-13 (×5): 100 mg via INTRAVENOUS
  Filled 2022-03-11 (×8): qty 2

## 2022-03-11 MED ORDER — SODIUM BICARBONATE 8.4 % IV SOLN
50.0000 meq | Freq: Once | INTRAVENOUS | Status: AC
  Administered 2022-03-11: 50 meq via INTRAVENOUS
  Filled 2022-03-11: qty 50

## 2022-03-11 MED ORDER — SODIUM BICARBONATE 8.4 % IV SOLN
50.0000 meq | Freq: Once | INTRAVENOUS | Status: AC
  Administered 2022-03-11: 50 meq via INTRAVENOUS
  Filled 2022-03-11: qty 100

## 2022-03-11 MED ORDER — ACETAMINOPHEN 650 MG RE SUPP: 650.0000 mg | Freq: Once | RECTAL | Status: DC

## 2022-03-11 MED ORDER — ACETAMINOPHEN 325 MG PO TABS
650.0000 mg | ORAL_TABLET | Freq: Four times a day (QID) | ORAL | Status: DC | PRN
  Administered 2022-03-11 (×2): 650 mg
  Filled 2022-03-11 (×2): qty 2

## 2022-03-11 MED ORDER — SODIUM CHLORIDE 0.9% FLUSH
5.0000 mL | Freq: Three times a day (TID) | INTRAVENOUS | Status: DC
  Administered 2022-03-11 – 2022-03-13 (×7): 5 mL

## 2022-03-11 NOTE — Progress Notes (Signed)
Patient with low BP, tachypnea, increased work of breathing and increasing mottled appearance. Elink and ground team notified. Vasopressors initiated and patient ultimately intubated. Post intubation patient went into Afib RVR, Elink again notified. Vasopressors changed and amiodarone drip initiated.

## 2022-03-11 NOTE — Progress Notes (Signed)
eLink Physician-Brief Progress Note Patient Name: Eric [name] DOB: Nov 01, 1950 MRN: [id number]   Date of Service  03/11/2022  HPI/Events of Note  Progressive hypotension, renal failure, and acidosis.  Acidosis from lactate and renal failure.  eICU Interventions  Abg Start bicarb drip     Intervention Category Major Interventions: Hypotension - evaluation and management  Mauri Brooklyn, P 03/11/2022, 5:13 AM

## 2022-03-11 NOTE — Plan of Care (Signed)
  Problem: Education: Goal: Knowledge of General Education information will improve Description: Including pain rating scale, medication(s)/side effects and non-pharmacologic comfort measures Outcome: Progressing   Problem: Health Behavior/Discharge Planning: Goal: Ability to manage health-related needs will improve Outcome: Progressing   Problem: Clinical Measurements: Goal: Ability to maintain clinical measurements within normal limits will improve Outcome: Progressing Goal: Will remain free from infection Outcome: Progressing Goal: Diagnostic test results will improve Outcome: Progressing Goal: Respiratory complications will improve Outcome: Progressing Goal: Cardiovascular complication will be avoided Outcome: Progressing   Problem: Activity: Goal: Risk for activity intolerance will decrease Outcome: Progressing   Problem: Nutrition: Goal: Adequate nutrition will be maintained Outcome: Progressing   Problem: Coping: Goal: Level of anxiety will decrease Outcome: Progressing   Problem: Elimination: Goal: Will not experience complications related to bowel motility Outcome: Progressing Goal: Will not experience complications related to urinary retention Outcome: Progressing   Problem: Pain Managment: Goal: General experience of comfort will improve Outcome: Progressing   Problem: Safety: Goal: Ability to remain free from injury will improve Outcome: Progressing   Problem: Skin Integrity: Goal: Risk for impaired skin integrity will decrease Outcome: Progressing   Problem: Safety: Goal: Non-violent Restraint(s) Outcome: Progressing   Problem: Activity: Goal: Ability to tolerate increased activity will improve Outcome: Progressing   Problem: Respiratory: Goal: Ability to maintain a clear airway and adequate ventilation will improve Outcome: Progressing   Problem: Role Relationship: Goal: Method of communication will improve Outcome: Progressing   

## 2022-03-11 NOTE — Consult Note (Signed)
[facility] Renal Consultation Note  Requesting MD: Dewald Indication for Consultation:  AKI  HPI:  Eric [name] is a 71 y.o. male who was pretty healthy-  hyperlipidemia and maybe some mild hypertension and mild hyperglycemia, obesity.  He presented to medical attention on 9/19 with c/o abdominal pain/N/V.  Upon presentation was in Afib with RVR-  got hypotensive and req DCC.- hypotension persists.  Labs showed AKI, acidosis, elevated LFTs, lactate above 9-  abd imaging showed acute cholecystitis- s/p percutaneous chole tube and admitted to ICU- pressors started-  his AKI has worsened- crt 5.4 this AM-  BUN 68, K of 4.9, bicarb of 12 with pH of 7.27.  is 8 liters positive but Fio2 is 40-  getting bicarb drip.  Regatrding baseline renal function- crt was 1.18 on 01/29/22  Creat  Date/Time Value Ref Range Status  01/29/2022 03:04 PM 1.18 0.70 - 1.28 mg/dL Final  07/16/2021 11:40 AM 1.04 0.70 - 1.28 mg/dL Final  04/09/2021 02:23 PM 1.09 0.70 - 1.28 mg/dL Final  01/29/2021 03:11 PM 1.08 0.70 - 1.28 mg/dL Final  09/12/2020 12:47 PM 0.96 0.70 - 1.18 mg/dL Final    Comment:    For patients >18 years of age, the reference limit for Creatinine is approximately 13% higher for people identified as African-American. .   06/12/2020 11:47 AM 1.19 (H) 0.70 - 1.18 mg/dL Final    Comment:    For patients >80 years of age, the reference limit for Creatinine is approximately 13% higher for people identified as African-American. Marland Kitchen   01/30/2020 02:53 PM 1.21 0.70 - 1.25 mg/dL Final    Comment:    For patients >50 years of age, the reference limit for Creatinine is approximately 13% higher for people identified as African-American. .   06/13/2019 12:04 PM 1.22 0.70 - 1.25 mg/dL Final    Comment:    For patients >32 years of age, the reference limit for Creatinine is approximately 13% higher for people identified as African-American. Marland Kitchen   01/04/2019 05:24 PM 1.17 0.70 - 1.25  mg/dL Final    Comment:    For patients >70 years of age, the reference limit for Creatinine is approximately 13% higher for people identified as African-American. .   06/08/2018 12:06 PM 1.29 (H) 0.70 - 1.25 mg/dL Final    Comment:    For patients >38 years of age, the reference limit for Creatinine is approximately 13% higher for people identified as African-American. .   12/07/2017 10:10 AM 1.14 0.70 - 1.25 mg/dL Final    Comment:    For patients >1 years of age, the reference limit for Creatinine is approximately 13% higher for people identified as African-American. Marland Kitchen   11/25/2016 09:14 AM 1.11 0.70 - 1.25 mg/dL Final    Comment:      For patients > or = 72 years of age: The upper reference limit for Creatinine is approximately 13% higher for people identified as African-American.     10/25/2015 10:18 AM 1.17 0.70 - 1.25 mg/dL Final  10/11/2014 10:49 AM 1.05 0.50 - 1.35 mg/dL Final  04/06/2014 08:57 AM 1.04 0.50 - 1.35 mg/dL Final  10/05/2013 10:43 AM 1.06 0.50 - 1.35 mg/dL Final   Creatinine, Ser  Date/Time Value Ref Range Status  03/11/2022 03:36 AM 5.45 (H) 0.61 - 1.24 mg/dL Final  03/11/2022 04:20 PM 4.95 (H) 0.61 - 1.24 mg/dL Final  03/02/2022 11:28 AM 5.20 (H) 0.61 - 1.24 mg/dL Final  03/14/2022 11:04 AM 4.86 (H) 0.61 -  1.24 mg/dL Final  05/06/2021 03:24 AM 1.05 0.61 - 1.24 mg/dL Final  11/26/2020 03:22 AM 1.19 0.61 - 1.24 mg/dL Final  11/14/2020 10:36 AM 1.08 0.61 - 1.24 mg/dL Final     PMHx:   Past Medical History:  Diagnosis Date   Arteriosclerosis of aorta (HCC)    Arthritis    Basal cell carcinoma    GERD (gastroesophageal reflux disease)    Hyperlipidemia    Labile hypertension    pt denies    OA (osteoarthritis) of knee 11/25/2020   Other testicular hypofunction    Prediabetes    (A1c 5.8% / 2013)   Vitamin D deficiency     Past Surgical History:  Procedure Laterality Date   CATARACT EXTRACTION, BILATERAL Bilateral 2019   Dr. Carmell Austria   dental implant surgery      IR PERC CHOLECYSTOSTOMY  03/04/2022   KNEE ARTHROSCOPY Left 1988   SKIN CANCER EXCISION     Ear, forehead, nose   TOTAL KNEE ARTHROPLASTY Left 11/25/2020   Procedure: TOTAL KNEE ARTHROPLASTY;  Surgeon: Gaynelle Arabian, MD;  Location: [facility];  Service: Orthopedics;  Laterality: Left;  60mn   TOTAL KNEE ARTHROPLASTY Right 05/05/2021   Procedure: TOTAL KNEE ARTHROPLASTY;  Surgeon: AGaynelle Arabian MD;  Location: [facility];  Service: Orthopedics;  Laterality: Right;    Family Hx:  Family History  Problem Relation Age of Onset   Diabetes Mother    Alzheimer's disease Mother    Heart disease Father    Hypertension Father    Stroke Father    Colon polyps Father     Social History:  reports that he quit smoking about 17 years ago. His smoking use included cigarettes. He started smoking about 53 years ago. He has a 55.50 pack-year smoking history. He has never used smokeless tobacco. He reports current alcohol use of about 2.0 - 3.0 standard drinks of alcohol per week. He reports that he does not use drugs.  Allergies:  Allergies  Allergen Reactions   Lipitor [Atorvastatin] Other (See Comments)    Cloudy headed    Lopid [Gemfibrozil] Other (See Comments)    Unknown reaction. Patient doesn't remember taking this medication.   Penicillins Other (See Comments)    Unknown childhood reaction Tolerated Cephalosporin Date: 11/26/20. Tolerated Cephalosporin Date: 05/06/21.      Medications: Prior to Admission medications   Medication Sig Start Date End Date Taking? Authorizing Provider  calcium carbonate (TUMS - DOSED IN MG ELEMENTAL CALCIUM) 500 MG chewable tablet Chew 1,000 mg by mouth daily as needed for indigestion or heartburn.   Yes [provider]  citalopram (CELEXA) 20 MG tablet Take 1 tablet (20 mg total) by mouth daily. 07/16/21 07/16/22 Yes CLiane Comber NP  Cyanocobalamin (B-12 PO) Take 1 capsule by mouth daily.   Yes [provider]  ibuprofen (ADVIL) 200 MG tablet Take 400 mg by mouth every 4 (four) hours as needed for moderate pain.   Yes [provider]  rosuvastatin (CRESTOR) 5 MG tablet TAKE 1 TABLET BY MOUTH DAILY FOR CHOLESTEROL AND PLAQUE ON ARTERIES Patient taking differently: Take 5 mg by mouth daily. 12/02/21  Yes WAlycia Rossetti NP  VITAMIN D, CHOLECALCIFEROL, PO Take 10,000 Units by mouth daily.   Yes [provider]    I have reviewed the patient's current medications.  Labs:  Results for orders placed or performed during the hospital encounter of 03/08/2022 (from the past 48 hour(s))  CBG monitoring, ED  Status: Abnormal   Collection Time: 02/23/2022 11:01 AM  Result Value Ref Range   Glucose-Capillary 116 (H) 70 - 99 mg/dL    Comment: Glucose reference range applies only to samples taken after fasting for at least 8 hours.  Lactic acid, plasma     Status: Abnormal   Collection Time: 03/06/2022 11:04 AM  Result Value Ref Range   Lactic Acid, Venous >9.0 (HH) 0.5 - 1.9 mmol/L    Comment: CRITICAL RESULT CALLED TO, READ BACK BY AND VERIFIED WITH N.WELLINGTON RN [id number] 03/01/2022 MCCORMICK K Performed at [facility], [place] [address], [place], [place] [address]   Comprehensive metabolic panel     Status: Abnormal   Collection Time: 02/20/2022 11:04 AM  Result Value Ref Range   Sodium 136 135 - 145 mmol/L   Potassium 2.8 (L) 3.5 - 5.1 mmol/L   Chloride 99 98 - 111 mmol/L   CO2 14 (L) 22 - 32 mmol/L   Glucose, Bld 105 (H) 70 - 99 mg/dL    Comment: Glucose reference range applies only to samples taken after fasting for at least 8 hours.   BUN 61 (H) 8 - 23 mg/dL   Creatinine, Ser 4.86 (H) 0.61 - 1.24 mg/dL   Calcium 8.7 (L) 8.9 - 10.3 mg/dL   Total Protein 5.0 (L) 6.5 - 8.1 g/dL   Albumin 2.2 (L) 3.5 - 5.0 g/dL   AST 95 (H) 15 - 41 U/L   ALT 45 (H) 0 - 44 U/L    Comment: RESULT CONFIRMED BY MANUAL DILUTION   Alkaline Phosphatase 520 (H) 38 - 126 U/L   Total  Bilirubin 3.3 (H) 0.3 - 1.2 mg/dL   GFR, Estimated 12 (L) >60 mL/min    Comment: (NOTE) Calculated using the CKD-EPI Creatinine Equation (2021)    Anion gap 23 (H) 5 - 15    Comment: Electrolytes repeated to confirm. Performed at [facility], [place] [address] [place], [place] [address]   CBC with Differential     Status: Abnormal   Collection Time: 02/21/2022 11:04 AM  Result Value Ref Range   WBC 6.9 4.0 - 10.5 K/uL   RBC 4.19 (L) 4.22 - 5.81 MIL/uL   Hemoglobin 14.3 13.0 - 17.0 g/dL   HCT 42.8 39.0 - 52.0 %   MCV 102.1 (H) 80.0 - 100.0 fL   MCH 34.1 (H) 26.0 - 34.0 pg   MCHC 33.4 30.0 - 36.0 g/dL   RDW 13.3 11.5 - 15.5 %   Platelets 30 (L) 150 - 400 K/uL    Comment: Immature Platelet Fraction may be clinically indicated, consider ordering this additional test [id number] REPEATED TO VERIFY PLATELET COUNT CONFIRMED BY SMEAR    nRBC 0.0 0.0 - 0.2 %   Neutrophils Relative % 70 %   Neutro Abs 4.8 1.7 - 7.7 K/uL   Lymphocytes Relative 10 %   Lymphs Abs 0.7 0.7 - 4.0 K/uL   Monocytes Relative 16 %   Monocytes Absolute 1.1 (H) 0.1 - 1.0 K/uL   Eosinophils Relative 0 %   Eosinophils Absolute 0.0 0.0 - 0.5 K/uL   Basophils Relative 0 %   Basophils Absolute 0.0 0.0 - 0.1 K/uL   WBC Morphology VACUOLATED NEUTROPHILS     Comment: MILD LEFT SHIFT (1-5% METAS, OCC MYELO, OCC BANDS)   nRBC 1 (H) 0 /100 WBC   Metamyelocytes Relative 4 %   Immature Granulocytes 4 %   Abs Immature Granulocytes 0.30 (H) 0.00 - 0.07  K/uL    Comment: Performed at [facility], [place] [address], [place], [place] [address]  Protime-INR     Status: Abnormal   Collection Time: 03/04/2022 11:04 AM  Result Value Ref Range   Prothrombin Time 21.5 (H) 11.4 - 15.2 seconds   INR 1.9 (H) 0.8 - 1.2    Comment: (NOTE) INR goal varies based on device and disease states. Performed at [facility], [place] [address] [place], [place] [address]   APTT     Status: Abnormal   Collection Time: 02/20/2022 11:04  AM  Result Value Ref Range   aPTT 42 (H) 24 - 36 seconds    Comment:        IF BASELINE aPTT IS ELEVATED, SUGGEST PATIENT RISK ASSESSMENT BE USED TO DETERMINE APPROPRIATE ANTICOAGULANT THERAPY. Performed at [facility], [place] [address] [place], [place] [address]   Resp Panel by RT-PCR (Flu A&B, Covid) Anterior Nasal Swab     Status: None   Collection Time: 02/21/2022 11:06 AM   Specimen: Anterior Nasal Swab  Result Value Ref Range   SARS Coronavirus 2 by RT PCR NEGATIVE NEGATIVE    Comment: (NOTE) SARS-CoV-2 target nucleic acids are NOT DETECTED.  The SARS-CoV-2 RNA is generally detectable in upper respiratory specimens during the acute phase of infection. The lowest concentration of SARS-CoV-2 viral copies this assay can detect is 138 copies/mL. A negative result does not preclude SARS-Cov-2 infection and should not be used as the sole basis for treatment or other patient management decisions. A negative result may occur with  improper specimen collection/handling, submission of specimen other than nasopharyngeal swab, presence of viral mutation(s) within the areas targeted by this assay, and inadequate number of viral copies(<138 copies/mL). A negative result must be combined with clinical observations, patient history, and epidemiological information. The expected result is Negative.  Fact Sheet for Patients:  EntrepreneurPulse.com.au  Fact Sheet for Healthcare Providers:  IncredibleEmployment.be  This test is no t yet approved or cleared by the [place] FDA and  has been authorized for detection and/or diagnosis of SARS-CoV-2 by FDA under an Emergency Use Authorization (EUA). This EUA will remain  in effect (meaning this test can be used) for the duration of the COVID-19 declaration under Section 564(b)(1) of the Act, 21 U.S.C.section 360bbb-3(b)(1), unless the authorization is terminated  or revoked sooner.        Influenza A by PCR NEGATIVE NEGATIVE   Influenza B by PCR NEGATIVE NEGATIVE    Comment: (NOTE) The Xpert Xpress SARS-CoV-2/FLU/RSV plus assay is intended as an aid in the diagnosis of influenza from Nasopharyngeal swab specimens and should not be used as a sole basis for treatment. Nasal washings and aspirates are unacceptable for Xpert Xpress SARS-CoV-2/FLU/RSV testing.  Fact Sheet for Patients: EntrepreneurPulse.com.au  Fact Sheet for Healthcare Providers: IncredibleEmployment.be  This test is not yet approved or cleared by the [place] FDA and has been authorized for detection and/or diagnosis of SARS-CoV-2 by FDA under an Emergency Use Authorization (EUA). This EUA will remain in effect (meaning this test can be used) for the duration of the COVID-19 declaration under Section 564(b)(1) of the Act, 21 U.S.C. section 360bbb-3(b)(1), unless the authorization is terminated or revoked.  Performed at [facility], [place] [address] [place], [place] [address]   CBG monitoring, ED     Status: None   Collection Time: 03/16/2022 11:18 AM  Result Value Ref Range   Glucose-Capillary 90 70 -  99 mg/dL    Comment: Glucose reference range applies only to samples taken after fasting for at least 8 hours.  Urinalysis, Routine w reflex microscopic     Status: Abnormal   Collection Time: 02/20/2022 11:21 AM  Result Value Ref Range   Color, Urine AMBER (A) YELLOW    Comment: BIOCHEMICALS MAY BE AFFECTED BY COLOR   APPearance CLOUDY (A) CLEAR   Specific Gravity, Urine 1.017 1.005 - 1.030   pH 5.0 5.0 - 8.0   Glucose, UA NEGATIVE NEGATIVE mg/dL   Hgb urine dipstick MODERATE (A) NEGATIVE   Bilirubin Urine NEGATIVE NEGATIVE   Ketones, ur NEGATIVE NEGATIVE mg/dL   Protein, ur 100 (A) NEGATIVE mg/dL   Nitrite NEGATIVE NEGATIVE   Leukocytes,Ua NEGATIVE NEGATIVE   RBC / HPF 0-5 0 - 5 RBC/hpf   WBC, UA 11-20 0 - 5 WBC/hpf   Bacteria, UA MANY (A) NONE SEEN    Squamous Epithelial / LPF 0-5 0 - 5   Mucus PRESENT    Sperm, UA PRESENT     Comment: Performed at [facility], [address], [place], [place] [address]  I-stat chem 8, ED     Status: Abnormal   Collection Time: 02/25/2022 11:28 AM  Result Value Ref Range   Sodium 134 (L) 135 - 145 mmol/L   Potassium 2.8 (L) 3.5 - 5.1 mmol/L   Chloride 101 98 - 111 mmol/L   BUN 56 (H) 8 - 23 mg/dL   Creatinine, Ser 5.20 (H) 0.61 - 1.24 mg/dL   Glucose, Bld 96 70 - 99 mg/dL    Comment: Glucose reference range applies only to samples taken after fasting for at least 8 hours.   Calcium, Ion 1.07 (L) 1.15 - 1.40 mmol/L   TCO2 15 (L) 22 - 32 mmol/L   Hemoglobin 14.3 13.0 - 17.0 g/dL   HCT 42.0 39.0 - 52.0 %  Type and screen [place]     Status: None   Collection Time: 02/22/2022 12:30 PM  Result Value Ref Range   ABO/RH(D) O POS    Antibody Screen NEG    Sample Expiration      03/17/22,2359 Performed at [place] [address], [place], [place] [address]   Blood Culture (routine x 2)     Status: None (Preliminary result)   Collection Time: 03/05/2022 12:36 PM   Specimen: BLOOD  Result Value Ref Range   Specimen Description BLOOD    Special Requests NONE    Culture      NO GROWTH < 24 HOURS Performed at [facility], [place] [address] [place], [place] [address]    Report Status PENDING   Blood Culture (routine x 2)     Status: None (Preliminary result)   Collection Time: 03/12/2022 12:36 PM   Specimen: BLOOD  Result Value Ref Range   Specimen Description BLOOD SITE NOT SPECIFIED    Special Requests      BOTTLES DRAWN AEROBIC AND ANAEROBIC Blood Culture adequate volume   Culture      NO GROWTH < 24 HOURS Performed at [facility], [place] [address] [place], [place] [address]    Report Status PENDING   Magnesium     Status: None   Collection Time: 02/24/2022 12:36 PM  Result Value Ref Range   Magnesium 1.9 1.7 - 2.4 mg/dL    Comment: Performed  at [facility], [place] [address], [place], [place] [address]  Troponin I (High Sensitivity)     Status: Abnormal   Collection Time: 03/09/2022 12:57 PM  Result Value Ref Range   Troponin I (High Sensitivity) 812 (HH) <18 ng/L    Comment: CRITICAL RESULT CALLED TO, READ BACK BY AND VERIFIED WITH Guerry Bruin RN '@[id number]'  02/21/2022 E.AHMED (NOTE) Elevated high sensitivity troponin I (hsTnI) values and significant  changes across serial measurements may suggest ACS but many other  chronic and acute conditions are known to elevate hsTnI results.  Refer to the "Links" section for chest pain algorithms and additional  guidance. Performed at [facility], [place] [address], [place], [place] [address]   Lactic acid, plasma     Status: Abnormal   Collection Time: 03/05/2022  1:00 PM  Result Value Ref Range   Lactic Acid, Venous 8.9 (HH) 0.5 - 1.9 mmol/L    Comment: CRITICAL VALUE NOTED. VALUE IS CONSISTENT WITH PREVIOUSLY REPORTED/CALLED VALUE Performed at [facility], [place] [address] [place], [place] [address]   DIC Panel ONCE - STAT     Status: Abnormal   Collection Time: 02/23/2022  2:54 PM  Result Value Ref Range   Prothrombin Time 20.0 (H) 11.4 - 15.2 seconds   INR 1.7 (H) 0.8 - 1.2    Comment: (NOTE) INR goal varies based on device and disease states.    aPTT 37 (H) 24 - 36 seconds    Comment:        IF BASELINE aPTT IS ELEVATED, SUGGEST PATIENT RISK ASSESSMENT BE USED TO DETERMINE APPROPRIATE ANTICOAGULANT THERAPY.    Fibrinogen 693 (H) 210 - 475 mg/dL    Comment: (NOTE) Fibrinogen results may be underestimated in patients receiving thrombolytic therapy.    D-Dimer, Quant >20.00 (H) 0.00 - 0.50 ug/mL-FEU    Comment: (NOTE) At the manufacturer cut-off value of 0.5 g/mL FEU, this assay has a negative predictive value of 95-100%.This assay is intended for use in conjunction with a clinical pretest probability (PTP) assessment model to exclude pulmonary embolism (PE)  and deep venous thrombosis (DVT) in outpatients suspected of PE or DVT. Results should be correlated with clinical presentation.    Platelets 25 (LL) 150 - 400 K/uL    Comment: SPECIMEN CHECKED FOR CLOTS Immature Platelet Fraction may be clinically indicated, consider ordering this additional test [id number] REPEATED TO VERIFY THIS CRITICAL RESULT HAS VERIFIED AND BEEN CALLED TO M COCHRAND,RN BY WALTER BOND ON 09 19 2023 AT 24, AND HAS BEEN READ BACK.     Smear Review NO SCHISTOCYTES SEEN     Comment: Performed at [facility], [place] [address], [place], [place] [address]  Basic metabolic panel     Status: Abnormal   Collection Time: 03/05/2022  4:20 PM  Result Value Ref Range   Sodium 133 (L) 135 - 145 mmol/L   Potassium 3.0 (L) 3.5 - 5.1 mmol/L   Chloride 96 (L) 98 - 111 mmol/L   CO2 21 (L) 22 - 32 mmol/L   Glucose, Bld 121 (H) 70 - 99 mg/dL    Comment: Glucose reference range applies only to samples taken after fasting for at least 8 hours.   BUN 63 (H) 8 - 23 mg/dL   Creatinine, Ser 4.95 (H) 0.61 - 1.24 mg/dL   Calcium 8.4 (L) 8.9 - 10.3 mg/dL   GFR, Estimated 12 (L) >60 mL/min    Comment: (NOTE) Calculated using the CKD-EPI Creatinine Equation (2021)    Anion gap 16 (H) 5 - 15  Comment: Performed at [facility], [place] [address] [place], [place] [address]  Lactic acid, plasma     Status: Abnormal   Collection Time: 03/08/2022  4:20 PM  Result Value Ref Range   Lactic Acid, Venous 5.0 (HH) 0.5 - 1.9 mmol/L    Comment: CRITICAL VALUE NOTED. VALUE IS CONSISTENT WITH PREVIOUSLY REPORTED/CALLED VALUE Performed at [facility], [place] [address], [place], [place] [address]   Troponin I (High Sensitivity)     Status: Abnormal   Collection Time: 03/18/2022  4:20 PM  Result Value Ref Range   Troponin I (High Sensitivity) 886 (HH) <18 ng/L    Comment: CRITICAL VALUE NOTED. VALUE IS CONSISTENT WITH PREVIOUSLY REPORTED/CALLED VALUE (NOTE) Elevated high sensitivity  troponin I (hsTnI) values and significant  changes across serial measurements may suggest ACS but many other  chronic and acute conditions are known to elevate hsTnI results.  Refer to the "Links" section for chest pain algorithms and additional  guidance. Performed at [facility], [place] [address] [place], [place] [address]   Aerobic/Anaerobic Culture w Gram Stain (surgical/deep wound)     Status: None (Preliminary result)   Collection Time: 03/01/2022  4:23 PM   Specimen: Gallbladder; Bile  Result Value Ref Range   Specimen Description GALL BLADDER    Special Requests Normal    Gram Stain NO WBC SEEN MODERATE GRAM NEGATIVE RODS     Culture      ABUNDANT ESCHERICHIA COLI CULTURE REINCUBATED FOR BETTER GROWTH Performed at [facility], [place] [address] [place], [place] [address]    Report Status PENDING   Glucose, capillary     Status: Abnormal   Collection Time: 02/20/2022  4:47 PM  Result Value Ref Range   Glucose-Capillary 123 (H) 70 - 99 mg/dL    Comment: Glucose reference range applies only to samples taken after fasting for at least 8 hours.  Glucose, capillary     Status: None   Collection Time: 03/06/2022  7:36 PM  Result Value Ref Range   Glucose-Capillary 89 70 - 99 mg/dL    Comment: Glucose reference range applies only to samples taken after fasting for at least 8 hours.  Lactic acid, plasma     Status: Abnormal   Collection Time: 03/11/2022  7:51 PM  Result Value Ref Range   Lactic Acid, Venous 4.6 (HH) 0.5 - 1.9 mmol/L    Comment: CRITICAL VALUE NOTED. VALUE IS CONSISTENT WITH PREVIOUSLY REPORTED/CALLED VALUE Performed at [facility], [place] [address], [place], [place] [address]   Troponin I (High Sensitivity)     Status: Abnormal   Collection Time: 03/12/2022  7:51 PM  Result Value Ref Range   Troponin I (High Sensitivity) 702 (HH) <18 ng/L    Comment: CRITICAL VALUE NOTED. VALUE IS CONSISTENT WITH PREVIOUSLY REPORTED/CALLED VALUE (NOTE) Elevated high  sensitivity troponin I (hsTnI) values and significant  changes across serial measurements may suggest ACS but many other  chronic and acute conditions are known to elevate hsTnI results.  Refer to the "Links" section for chest pain algorithms and additional  guidance. Performed at [facility], [place] [address] [place], [place] [address]   I-STAT 7, (LYTES, BLD GAS, ICA, H+H)     Status: Abnormal   Collection Time: 02/28/2022  9:21 PM  Result Value Ref Range   pH, Arterial 7.355 7.35 - 7.45   pCO2 arterial 31.5 (L) 32 - 48 mmHg   pO2, Arterial  86 83 - 108 mmHg   Bicarbonate 17.6 (L) 20.0 - 28.0 mmol/L   TCO2 19 (L) 22 - 32 mmol/L   O2 Saturation 96 %   Acid-base deficit 7.0 (H) 0.0 - 2.0 mmol/L   Sodium 131 (L) 135 - 145 mmol/L   Potassium 3.7 3.5 - 5.1 mmol/L   Calcium, Ion 1.11 (L) 1.15 - 1.40 mmol/L   HCT 37.0 (L) 39.0 - 52.0 %   Hemoglobin 12.6 (L) 13.0 - 17.0 g/dL   Patient temperature 98.4 F    Collection site Magazine features editor by RT    Sample type ARTERIAL   I-STAT 7, (LYTES, BLD GAS, ICA, H+H)     Status: Abnormal   Collection Time: 03/15/2022 11:10 PM  Result Value Ref Range   pH, Arterial 7.349 (L) 7.35 - 7.45   pCO2 arterial 35.4 32 - 48 mmHg   pO2, Arterial 316 (H) 83 - 108 mmHg   Bicarbonate 19.5 (L) 20.0 - 28.0 mmol/L   TCO2 21 (L) 22 - 32 mmol/L   O2 Saturation 100 %   Acid-base deficit 5.0 (H) 0.0 - 2.0 mmol/L   Sodium 132 (L) 135 - 145 mmol/L   Potassium 3.8 3.5 - 5.1 mmol/L   Calcium, Ion 1.10 (L) 1.15 - 1.40 mmol/L   HCT 39.0 39.0 - 52.0 %   Hemoglobin 13.3 13.0 - 17.0 g/dL   Patient temperature 98.4 F    Sample type ARTERIAL   Glucose, capillary     Status: Abnormal   Collection Time: 03/15/2022 11:38 PM  Result Value Ref Range   Glucose-Capillary 68 (L) 70 - 99 mg/dL    Comment: Glucose reference range applies only to samples taken after fasting for at least 8 hours.  Glucose, capillary     Status: None   Collection Time: 02/21/2022 11:57 PM   Result Value Ref Range   Glucose-Capillary 76 70 - 99 mg/dL    Comment: Glucose reference range applies only to samples taken after fasting for at least 8 hours.  I-STAT 7, (LYTES, BLD GAS, ICA, H+H)     Status: Abnormal   Collection Time: 03/11/22  1:01 AM  Result Value Ref Range   pH, Arterial 7.303 (L) 7.35 - 7.45   pCO2 arterial 33.6 32 - 48 mmHg   pO2, Arterial 117 (H) 83 - 108 mmHg   Bicarbonate 16.4 (L) 20.0 - 28.0 mmol/L   TCO2 17 (L) 22 - 32 mmol/L   O2 Saturation 98 %   Acid-base deficit 9.0 (H) 0.0 - 2.0 mmol/L   Sodium 129 (L) 135 - 145 mmol/L   Potassium 4.1 3.5 - 5.1 mmol/L   Calcium, Ion 1.09 (L) 1.15 - 1.40 mmol/L   HCT 36.0 (L) 39.0 - 52.0 %   Hemoglobin 12.2 (L) 13.0 - 17.0 g/dL   Patient temperature 100.7 F    Sample type ARTERIAL   Lactic acid, plasma     Status: Abnormal   Collection Time: 03/11/22  1:42 AM  Result Value Ref Range   Lactic Acid, Venous 4.2 (HH) 0.5 - 1.9 mmol/L    Comment: CRITICAL VALUE NOTED. VALUE IS CONSISTENT WITH PREVIOUSLY REPORTED/CALLED VALUE Performed at [facility], [place] [address], [place] [address]   CBC     Status: Abnormal   Collection Time: 03/11/22  3:36 AM  Result Value Ref Range   WBC 25.5 (H) 4.0 - 10.5 K/uL   RBC 3.85 (L) 4.22 - 5.81 MIL/uL  Hemoglobin 13.4 13.0 - 17.0 g/dL   HCT 38.5 (L) 39.0 - 52.0 %   MCV 100.0 80.0 - 100.0 fL   MCH 34.8 (H) 26.0 - 34.0 pg   MCHC 34.8 30.0 - 36.0 g/dL   RDW 14.2 11.5 - 15.5 %   Platelets 28 (LL) 150 - 400 K/uL    Comment: Immature Platelet Fraction may be clinically indicated, consider ordering this additional test [id number] CONSISTENT WITH PREVIOUS RESULT CRITICAL VALUE NOTED.  VALUE IS CONSISTENT WITH PREVIOUSLY REPORTED AND CALLED VALUE. REPEATED TO VERIFY    nRBC 0.0 0.0 - 0.2 %    Comment: Performed at [facility], [place] [address] [place], [place] [address]  Comprehensive metabolic panel     Status: Abnormal   Collection Time: 03/11/22  3:36 AM   Result Value Ref Range   Sodium 133 (L) 135 - 145 mmol/L   Potassium 4.7 3.5 - 5.1 mmol/L   Chloride 101 98 - 111 mmol/L   CO2 12 (L) 22 - 32 mmol/L   Glucose, Bld 149 (H) 70 - 99 mg/dL    Comment: Glucose reference range applies only to samples taken after fasting for at least 8 hours.   BUN 68 (H) 8 - 23 mg/dL   Creatinine, Ser 5.45 (H) 0.61 - 1.24 mg/dL   Calcium 8.0 (L) 8.9 - 10.3 mg/dL   Total Protein 4.6 (L) 6.5 - 8.1 g/dL   Albumin 1.8 (L) 3.5 - 5.0 g/dL   AST 198 (H) 15 - 41 U/L   ALT 74 (H) 0 - 44 U/L   Alkaline Phosphatase 198 (H) 38 - 126 U/L   Total Bilirubin 3.8 (H) 0.3 - 1.2 mg/dL   GFR, Estimated 11 (L) >60 mL/min    Comment: (NOTE) Calculated using the CKD-EPI Creatinine Equation (2021)    Anion gap 20 (H) 5 - 15    Comment: Performed at [facility], [place] [address], [place], [place] [address]  Magnesium     Status: Abnormal   Collection Time: 03/11/22  3:36 AM  Result Value Ref Range   Magnesium 2.5 (H) 1.7 - 2.4 mg/dL    Comment: Performed at [facility] [address], [place], [place] [address]  Protime-INR     Status: Abnormal   Collection Time: 03/11/22  3:36 AM  Result Value Ref Range   Prothrombin Time 21.9 (H) 11.4 - 15.2 seconds   INR 1.9 (H) 0.8 - 1.2    Comment: (NOTE) INR goal varies based on device and disease states. Performed at [facility], [place] [address], [place], [place] [address]   Triglycerides     Status: Abnormal   Collection Time: 03/11/22  3:36 AM  Result Value Ref Range   Triglycerides 516 (H) <150 mg/dL    Comment: Performed at [facility] [address], [place], [place] [address]  Lipase, blood     Status: None   Collection Time: 03/11/22  3:36 AM  Result Value Ref Range   Lipase 26 11 - 51 U/L    Comment: Performed at [facility] [address] [place], [place] [address]  Brain natriuretic peptide     Status: Abnormal   Collection Time: 03/11/22  3:36 AM  Result Value Ref Range   B  Natriuretic Peptide 1,712.5 (H) 0.0 - 100.0 pg/mL    Comment: Performed at [facility] [address], [place], [place] [address]  Glucose, capillary  Status: Abnormal   Collection Time: 03/11/22  3:39 AM  Result Value Ref Range   Glucose-Capillary 46 (L) 70 - 99 mg/dL    Comment: Glucose reference range applies only to samples taken after fasting for at least 8 hours.  Glucose, capillary     Status: Abnormal   Collection Time: 03/11/22  3:41 AM  Result Value Ref Range   Glucose-Capillary 143 (H) 70 - 99 mg/dL    Comment: Glucose reference range applies only to samples taken after fasting for at least 8 hours.  I-STAT 7, (LYTES, BLD GAS, ICA, H+H)     Status: Abnormal   Collection Time: 03/11/22  5:32 AM  Result Value Ref Range   pH, Arterial 7.270 (L) 7.35 - 7.45   pCO2 arterial 23.0 (L) 32 - 48 mmHg   pO2, Arterial 139 (H) 83 - 108 mmHg   Bicarbonate 10.5 (L) 20.0 - 28.0 mmol/L   TCO2 11 (L) 22 - 32 mmol/L   O2 Saturation 99 %   Acid-base deficit 14.0 (H) 0.0 - 2.0 mmol/L   Sodium 130 (L) 135 - 145 mmol/L   Potassium 4.9 3.5 - 5.1 mmol/L   Calcium, Ion 1.01 (L) 1.15 - 1.40 mmol/L   HCT 38.0 (L) 39.0 - 52.0 %   Hemoglobin 12.9 (L) 13.0 - 17.0 g/dL   Patient temperature 99.3 F    Collection site art line    Drawn by RT    Sample type ARTERIAL   Lactic acid, plasma     Status: Abnormal   Collection Time: 03/11/22  7:39 AM  Result Value Ref Range   Lactic Acid, Venous >9.0 (HH) 0.5 - 1.9 mmol/L    Comment: CRITICAL VALUE NOTED. VALUE IS CONSISTENT WITH PREVIOUSLY REPORTED/CALLED VALUE Performed at [facility], [place] [address], [place], [place] [address]   Glucose, capillary     Status: Abnormal   Collection Time: 03/11/22  7:52 AM  Result Value Ref Range   Glucose-Capillary 68 (L) 70 - 99 mg/dL    Comment: Glucose reference range applies only to samples taken after fasting for at least 8 hours.  MRSA Next Gen by PCR, Nasal     Status: None   Collection Time:  03/11/22  8:02 AM   Specimen: Nasal Mucosa; Nasal Swab  Result Value Ref Range   MRSA by PCR Next Gen NOT DETECTED NOT DETECTED    Comment: (NOTE) The GeneXpert MRSA Assay (FDA approved for NASAL specimens only), is one component of a comprehensive MRSA colonization surveillance program. It is not intended to diagnose MRSA infection nor to guide or monitor treatment for MRSA infections. Test performance is not FDA approved in patients less than 30 years old. Performed at [facility], [place] [address] [place], [place] [address]   Cooxemetry Panel (carboxy, met, total hgb, O2 sat)     Status: None   Collection Time: 03/11/22  8:19 AM  Result Value Ref Range   Total hemoglobin 12.5 12.0 - 16.0 g/dL   O2 Saturation 100 %   Carboxyhemoglobin 1.3 0.5 - 1.5 %   Methemoglobin <0.7 0.0 - 1.5 %    Comment: Performed at [facility], [place] [address] [place], [place] [address]  Glucose, capillary     Status: Abnormal   Collection Time: 03/11/22  9:14 AM  Result Value Ref Range   Glucose-Capillary 141 (H) 70 - 99 mg/dL    Comment: Glucose reference range applies only to samples taken after  fasting for at least 8 hours.  Lactic acid, plasma     Status: Abnormal   Collection Time: 03/11/22  9:17 AM  Result Value Ref Range   Lactic Acid, Venous >9.0 (HH) 0.5 - 1.9 mmol/L    Comment: CRITICAL VALUE NOTED. VALUE IS CONSISTENT WITH PREVIOUSLY REPORTED/CALLED VALUE Performed at [facility], [place] [address], [place], [place] [address]   Blood gas, venous     Status: Abnormal   Collection Time: 03/11/22  9:57 AM  Result Value Ref Range   pH, Ven 7.22 (L) 7.25 - 7.43   pCO2, Ven 35 (L) 44 - 60 mmHg   pO2, Ven 42 32 - 45 mmHg   Bicarbonate 14.3 (L) 20.0 - 28.0 mmol/L   Acid-base deficit 12.5 (H) 0.0 - 2.0 mmol/L   O2 Saturation 69.4 %   Patient temperature 37.0     Comment: Performed at [facility] [address] [place], [place] [address]  Glucose, capillary      Status: Abnormal   Collection Time: 03/11/22 11:46 AM  Result Value Ref Range   Glucose-Capillary 156 (H) 70 - 99 mg/dL    Comment: Glucose reference range applies only to samples taken after fasting for at least 8 hours.  Cooxemetry Panel (carboxy, met, total hgb, O2 sat)     Status: None   Collection Time: 03/11/22 12:43 PM  Result Value Ref Range   Total hemoglobin 12.5 12.0 - 16.0 g/dL   O2 Saturation 73.2 %   Carboxyhemoglobin 1.2 0.5 - 1.5 %   Methemoglobin 1.1 0.0 - 1.5 %    Comment: Performed at [facility] [address], [place], [place] [address]     ROS:  Review of systems not obtained due to patient factors.  Physical Exam: Vitals:   03/11/22 1245 03/11/22 1300  BP:  (!) 106/38  Pulse: 68 68  Resp: (!) 27 (!) 27  Temp:    SpO2: 97% 98%     General: sedated on vent-  wife and daughter at bedside HEENT: PERRLA, EOMI, mucous membranes moist Neck: no JVD Heart: RRR Lungs: mostly clear Abdomen: distended-  chole tube with black drainage Extremities: mottled but not peripheral edema  Skin: warm and dry Neuro: sedated on vent   Assessment/Plan: 71 year old WM-  pretty healthy at baseline-  presents with sepsis secondary to cholecystitis  complicated by AKI 1.Renal- baseline crt 1.18 in August of 23.  Now AKI due to severe sepsis/ MSOF reaction to acute cholecystitis-  on pressors, VDRF.  Crt rising-  oliguoanuric.  There are no absolute indications for dialysis at this time.  The only close thing would be acidosis but bicarb drip has just been started today.  CCM placing line for CRRT-  will follow labs closely and start when appropriate-  will check another set of chemistries this afternoon  2. Hypertension/volume  - very hypotensive-  req pressor support at present-  sepsis type picture-  also has been adequately hydrated-  8 liters positive  Fi02 still OK at 40 3. Cholecystitis-  possibly abscess as well ?  S/p per chole tube and on maxepime and flagyl-  no  positive blood cultures as of yet  4. Anemia  - not an issue at this point  5. Metabolic acidosis-  bicarb drip for now    Louis Meckel 03/11/2022, 1:15 PM

## 2022-03-11 NOTE — Progress Notes (Signed)
Subjective: Patient intubated, very critically ill this morning.  Maxed on 2 pressors Wife and daughter at the bedside  Objective: Vital signs in last 24 hours: Temp:  [97.2 F (36.2 C)-101 F (38.3 C)] 98.6 F (37 C) (09/20 0753) Pulse Rate:  [28-150] 74 (09/20 0900) Resp:  [17-52] 30 (09/20 0900) BP: (62-127)/(24-108) 84/65 (09/20 0816) SpO2:  [58 %-100 %] 95 % (09/20 0900) Arterial Line BP: (81-134)/(31-75) 81/42 (09/20 0900) FiO2 (%):  [40 %-100 %] 40 % (09/20 0816) Weight:  [95.3 kg] 95.3 kg (09/19 1115) Last BM Date : 03/09/22  Intake/Output from previous day: 09/19 0701 - 09/20 0700 In: 8655.6 [I.V.:7071.8; IV Piggyback:1583.8] Out: 360 [Urine:210; Drains:150] Intake/Output this shift: Total I/O In: 1001.4 [I.V.:849.9; IV Piggyback:151.5] Out: -   PE: Gen: critically ill on vent Heart: regular Lungs: vented Abd: soft, perc chole drain in place with bilious output Ext: mottled in BLE up to mid thighs.  Unable to palpate pedal pulses  Lab Results:  Recent Labs    03/04/2022 1104 03/12/2022 1128 03/06/2022 1454 03/12/2022 2121 03/11/22 0336 03/11/22 0532  WBC 6.9  --   --   --  25.5*  --   HGB 14.3   < >  --    < > 13.4 12.9*  HCT 42.8   < >  --    < > 38.5* 38.0*  PLT 30*  --  25*  --  28*  --    < > = values in this interval not displayed.   BMET Recent Labs    03/21/2022 1620 02/27/2022 2121 03/11/22 0336 03/11/22 0532  NA 133*   < > 133* 130*  K 3.0*   < > 4.7 4.9  CL 96*  --  101  --   CO2 21*  --  12*  --   GLUCOSE 121*  --  149*  --   BUN 63*  --  68*  --   CREATININE 4.95*  --  5.45*  --   CALCIUM 8.4*  --  8.0*  --    < > = values in this interval not displayed.   PT/INR Recent Labs    02/21/2022 1454 03/11/22 0336  LABPROT 20.0* 21.9*  INR 1.7* 1.9*   CMP     Component Value Date/Time   NA 130 (L) 03/11/2022 0532   K 4.9 03/11/2022 0532   CL 101 03/11/2022 0336   CO2 12 (L) 03/11/2022 0336   GLUCOSE 149 (H) 03/11/2022 0336    BUN 68 (H) 03/11/2022 0336   CREATININE 5.45 (H) 03/11/2022 0336   CREATININE 1.18 01/29/2022 1504   CALCIUM 8.0 (L) 03/11/2022 0336   PROT 4.6 (L) 03/11/2022 0336   ALBUMIN 1.8 (L) 03/11/2022 0336   AST 198 (H) 03/11/2022 0336   ALT 74 (H) 03/11/2022 0336   ALKPHOS 198 (H) 03/11/2022 0336   BILITOT 3.8 (H) 03/11/2022 0336   GFRNONAA 11 (L) 03/11/2022 0336   GFRNONAA 80 09/12/2020 1247   GFRAA 92 09/12/2020 1247   Lipase     Component Value Date/Time   LIPASE 26 03/11/2022 0336       Studies/Results: DG CHEST PORT 1 VIEW  Result Date: 03/11/2022 CLINICAL DATA:  Hypoxia EXAM: PORTABLE CHEST 1 VIEW COMPARISON:  03/14/2022 FINDINGS: Cardiac shadow is stable. Endotracheal tube, gastric catheter and left jugular central line are noted in satisfactory position. No focal infiltrate or effusion is seen. No bony abnormality is noted. IMPRESSION: No active  disease. Electronically Signed   By: Inez Catalina M.D.   On: 03/11/2022 03:47   DG Abd 1 View  Result Date: 03/11/2022 CLINICAL DATA:  Hypoxia and abdominal pain EXAM: ABDOMEN - 1 VIEW COMPARISON:  None Available. FINDINGS: Cholecystostomy tube is noted in satisfactory position. No renal or ureteral calculi seen. Gastric catheter extends into the stomach. No bony abnormality is seen. IMPRESSION: No acute abnormality noted. Electronically Signed   By: Inez Catalina M.D.   On: 03/11/2022 03:47   DG CHEST PORT 1 VIEW  Result Date: 03/03/2022 CLINICAL DATA:  Septic shock, intubated EXAM: PORTABLE CHEST 1 VIEW COMPARISON:  03/15/2022 FINDINGS: Single frontal view of the chest demonstrates endotracheal tube overlying tracheal air column tip midway between thoracic inlet and carina. Enteric catheter passes below diaphragm tip excluded by collimation. Left internal jugular catheter tip overlies superior vena cava. External defibrillator pads overlie the left chest. Cardiac silhouette is unremarkable. There is increased central vascular congestion,  with developing bilateral perihilar ground-glass airspace disease. No effusion or pneumothorax. No acute bony abnormalities. IMPRESSION: 1. Support devices as above. 2. Increased vascular congestion with progressive bilateral perihilar ground-glass airspace disease, most consistent with fluid overload and mild edema. Electronically Signed   By: Randa Ngo M.D.   On: 03/01/2022 22:29   IR Perc Cholecystostomy  Result Date: 03/21/2022 INDICATION: Acute cholecystitis and sepsis. Percutaneous cholecystostomy E required as the patient is not a candidate for cholecystectomy currently. EXAM: PERCUTANEOUS CHOLECYSTOSTOMY TUBE PLACEMENT MEDICATIONS: None ANESTHESIA/SEDATION: Formal conscious sedation was not performed due to critical illness and poor airway. The patient was given 1.0 mg IV Versed during the procedure. FLUOROSCOPY: Radiation Exposure Index (as provided by the fluoroscopic device): 1.0 mGy Kerma COMPLICATIONS: None immediate. PROCEDURE: Informed written consent was obtained from the patient's wife after a thorough discussion of the procedural risks, benefits and alternatives. All questions were addressed. Maximal Sterile Barrier Technique was utilized including caps, mask, sterile gowns, sterile gloves, sterile drape, hand hygiene and skin antiseptic. A timeout was performed prior to the initiation of the procedure. Ultrasound was used to localize the gallbladder. Local anesthesia was provided with 1% lidocaine. An 18 gauge trocar needle was advanced into the gallbladder lumen under ultrasound guidance. After confirming needle tip position and with return of bile, a guidewire was advanced into the gallbladder lumen and the needle removed. The tract was dilated to 10 [place]. A 10 French percutaneous drain was advanced into the gallbladder. A sample of bile was then withdrawn through the drain and sent for culture analysis. Contrast was injected via the cholecystostomy tube and a fluoroscopic spot image  obtained. The catheter was then flushed with sterile saline and attached to a gravity drainage bag. It was secured at the skin with a Prolene retention suture and StatLock device. FINDINGS: Ultrasound demonstrates inflamed gallbladder with gallbladder wall thickening as well as edematous wall and edema surrounding the gallbladder fossa within the liver. A discrete hepatic abscess is not identified with small pockets of fluid and edema adjacent to the gallbladder within the liver parenchyma. After placement of the cholecystostomy tube there is return of cloudy purulent appearing bile. A sample was sent for culture analysis. IMPRESSION: Placement of percutaneous cholecystostomy tube into the gallbladder. A sample of purulent bile was sent for culture analysis. Initial ultrasound demonstrates adjacent areas of fluid and edema within the liver adjacent to the gallbladder fossa. A discrete unilocular abscess was not identified. Electronically Signed   By: Aletta Edouard M.D.   On: 02/26/2022  16:35   US Abdomen Limited RUQ (LIVER/GB)  Result Date: 03/11/2022 CLINICAL DATA:  Right upper quadrant pain. EXAM: ULTRASOUND ABDOMEN LIMITED RIGHT UPPER QUADRANT COMPARISON:  CT abdomen and pelvis 03/09/2022 FINDINGS: Gallbladder: The gallbladder wall is again thickened, measuring up to 7 mm. The sonographer reports a positive sonographic Murphy's sign" with patient pain when the probe is pressed on the gallbladder. There is again mild pericholecystic fluid. No gallstone is seen. Common bile duct: Diameter: 5 mm, within normal limits. Liver: There is a hypoechoic region with lobular margins just anterior and superior to the gallbladder corresponding to the dense fluid region on today's CT. This measures up to approximately 4.5 x 3.0 x 4.2 cm. There is heterogeneous hepatic echotexture. Portal vein is patent on color Doppler imaging with normal direction of blood flow towards the liver. Other: None. IMPRESSION: 1. There is  again gallbladder wall thickening and pericholecystic fluid. Positive sonographic Murphy's sign. Findings suggest acalculous cholecystitis. Note is made that there was primary or secondary inflammatory change within the adjacent hepatic flexure of the colon on today's CT. It is unclear whether the primary cause of the inflammation is the gallbladder, colon, or both. 2. Mildly complex fluid collection adjacent to the gallbladder fossa as seen on CT. Differential considerations again include a possible complex cyst or abscess. Electronically Signed   By: Yvonne Kendall M.D.   On: 02/20/2022 15:19   CT ABDOMEN PELVIS W CONTRAST  Result Date: 03/11/2022 CLINICAL DATA:  Abdominal pain, acute, nonlocalized EXAM: CT ABDOMEN AND PELVIS WITH CONTRAST TECHNIQUE: Multidetector CT imaging of the abdomen and pelvis was performed using the standard protocol following bolus administration of intravenous contrast. RADIATION DOSE REDUCTION: This exam was performed according to the departmental dose-optimization program which includes automated exposure control, adjustment of the mA and/or kV according to patient size and/or use of iterative reconstruction technique. CONTRAST:  29m OMNIPAQUE IOHEXOL 350 MG/ML SOLN COMPARISON:  None Available. FINDINGS: Lower chest: Bibasilar subsegmental atelectasis. Heart size within normal limits. Gynecomastia is evident on the right. Hepatobiliary: Abnormal appearance of the gallbladder which is moderately dilated with wall thickening, pericholecystic fluid, and pericholecystic fat stranding. No hyperdense gallstone. There is an irregular hypoattenuating lesion within the liver adjacent to the gallbladder fossa measuring approximately 4.4 x 2.5 x 3.6 cm with internal density slightly greater than fluid. Liver appears otherwise unremarkable. No intrahepatic biliary dilatation. Pancreas: Unremarkable. No pancreatic ductal dilatation or surrounding inflammatory changes. Spleen: Normal in size  without focal abnormality. Adrenals/Urinary Tract: Unremarkable adrenal glands. Kidneys enhance symmetrically without solid lesion, stone, or hydronephrosis. Ureters are nondilated. Urinary bladder is decompressed by Foley catheter. Stomach/Bowel: Long segment wall thickening of the colon centered at the hepatic flexure, which may be reactive secondary to local inflammation associated with the gallbladder. Sigmoid diverticulosis. Normal appendix in the right lower quadrant. No dilated loops of bowel. Small hiatal hernia. Vascular/Lymphatic: Scattered aortoiliac atherosclerotic calcifications without aneurysm. No abdominopelvic lymphadenopathy. Reproductive: Prostate is unremarkable. Other: No ascites. No abdominopelvic fluid collection. No pneumoperitoneum. No abdominal wall hernia. Musculoskeletal: No acute or significant osseous findings. IMPRESSION: 1. Appearance of the gallbladder is highly suspicious for acute cholecystitis. Consider further evaluation with right upper quadrant ultrasound. 2. Irregular low-density lesion within the liver adjacent to the gallbladder fossa measuring up to 4.4 cm. This could represent a hepatic abscess, complex cyst, or neoplasm. 3. Long segment wall thickening of the colon centered at the hepatic flexure, which may be reactive secondary to local inflammation associated with the gallbladder versus a  nonspecific colitis. 4. Sigmoid diverticulosis without evidence of acute diverticulitis. 5. Aortic Atherosclerosis (ICD10-I70.0). Electronically Signed   By: Davina Poke D.O.   On: 02/28/2022 14:02   CT Angio Chest PE W and/or Wo Contrast  Result Date: 02/22/2022 CLINICAL DATA:  Chest pain. EXAM: CT ANGIOGRAPHY CHEST WITH CONTRAST TECHNIQUE: Multidetector CT imaging of the chest was performed using the standard protocol during bolus administration of intravenous contrast. Multiplanar CT image reconstructions and MIPs were obtained to evaluate the vascular anatomy. RADIATION  DOSE REDUCTION: This exam was performed according to the departmental dose-optimization program which includes automated exposure control, adjustment of the mA and/or kV according to patient size and/or use of iterative reconstruction technique. CONTRAST:  34m OMNIPAQUE IOHEXOL 350 MG/ML SOLN COMPARISON:  June 28, 2018. FINDINGS: Cardiovascular: Satisfactory opacification of the pulmonary arteries to the segmental level. No evidence of pulmonary embolism. Normal heart size. No pericardial effusion. Mediastinum/Nodes: No enlarged mediastinal, hilar, or axillary lymph nodes. Thyroid gland, trachea, and esophagus demonstrate no significant findings. Lungs/Pleura: Lungs are clear. No pleural effusion or pneumothorax. Upper Abdomen: No acute abnormality. Musculoskeletal: No chest wall abnormality. No acute or significant osseous findings. Review of the MIP images confirms the above findings. IMPRESSION: No definite evidence of pulmonary embolus. No acute abnormality seen in the chest. Electronically Signed   By: JMarijo ConceptionM.D.   On: 03/11/2022 13:58   CT Head Wo Contrast  Result Date: 03/14/2022 CLINICAL DATA:  Head trauma. EXAM: CT HEAD WITHOUT CONTRAST TECHNIQUE: Contiguous axial images were obtained from the base of the skull through the vertex without intravenous contrast. RADIATION DOSE REDUCTION: This exam was performed according to the departmental dose-optimization program which includes automated exposure control, adjustment of the mA and/or kV according to patient size and/or use of iterative reconstruction technique. COMPARISON:  None Available. FINDINGS: Brain: No evidence of acute infarction, hemorrhage, hydrocephalus, extra-axial collection or mass lesion/mass effect. There are sequela of moderate chronic microvascular ischemic change with advanced generalized volume loss. Vascular: No hyperdense vessel or unexpected calcification. Skull: Normal. Negative for fracture or focal lesion.  Sinuses/Orbits: Bilateral lens replacement. Mild mucosal thickening bilateral maxillary and ethmoid sinuses, as well as right sphenoid sinus. Other: None. IMPRESSION: No CT evidence of acute intracranial injury. Electronically Signed   By: HMarin RobertsM.D.   On: 03/05/2022 13:51   DG Chest Port 1 View  Result Date: 02/21/2022 CLINICAL DATA:  Provided history: Questionable sepsis-evaluate for abnormality. EXAM: PORTABLE CHEST 1 VIEW COMPARISON:  Prior chest radiographs 09/12/2020 and earlier. FINDINGS: Heart size within normal limits. Aortic atherosclerosis. Minimal atelectasis within the right lung base. No appreciable airspace consolidation or pulmonary edema. No evidence of pleural effusion or pneumothorax. No acute bony abnormality identified. IMPRESSION: Minimal atelectasis within the right lung base. Otherwise, no evidence of acute cardiopulmonary abnormality. Aortic Atherosclerosis (ICD10-I70.0). Electronically Signed   By: KKellie SimmeringD.O.   On: 02/28/2022 12:23    Anti-infectives: Anti-infectives (From admission, onward)    Start     Dose/Rate Route Frequency Ordered Stop   03/11/22 1100  ceFEPIme (MAXIPIME) 2 g in sodium chloride 0.9 % 100 mL IVPB        2 g 200 mL/hr over 30 Minutes Intravenous Every 24 hours 03/16/2022 1327     03/18/2022 2000  metroNIDAZOLE (FLAGYL) IVPB 500 mg        500 mg 100 mL/hr over 60 Minutes Intravenous Every 12 hours 02/26/2022 1543     03/12/2022 1322  vancomycin variable dose per  unstable renal function (pharmacist dosing)         Does not apply See admin instructions 03/09/2022 1327     03/19/2022 1200  vancomycin (VANCOREADY) IVPB 2000 mg/400 mL        2,000 mg 200 mL/hr over 120 Minutes Intravenous  Once 03/09/2022 1130 03/20/2022 1412   03/19/2022 1115  ceFEPIme (MAXIPIME) 2 g in sodium chloride 0.9 % 100 mL IVPB        2 g 200 mL/hr over 30 Minutes Intravenous  Once 03/04/2022 1108 03/20/2022 1200   03/15/2022 1115  metroNIDAZOLE (FLAGYL) IVPB 500 mg        500  mg 100 mL/hr over 60 Minutes Intravenous  Once 03/03/2022 1108 03/02/2022 1315   02/23/2022 1115  vancomycin (VANCOCIN) IVPB 1000 mg/200 mL premix  Status:  Discontinued        1,000 mg 200 mL/hr over 60 Minutes Intravenous  Once 03/01/2022 1108 03/18/2022 1129        Assessment/Plan Severe sepsis secondary to Acute cholecystitis with likely Liver abscess -perc chole placed by IR yesterday, appreciate their assistance. -worsening sepsis overnight requiring intubation and significant pressor support. -cont abx therapy -LFTs remain elevated, but also likely secondary to shock liver in the setting of severe sepsis.  Monitor. -discussed surgical issues with wife and daughter at bedside    FEN: NPO/IVFs/pressor ID: [email or web address] VTE: none given low platelets   A fib RVR - s/p cardioversion.  on amio, per primary ARF - essentially oliguric - foley in place, rising Cr ? DIC - defer to primary Supra-therapeutic INR - secondary to sepsis Thrombocytopenia - secondary to sepsis HLD Hypokalemia - resolved Elevated LFTs - secondary to above, monitor VDRF - per primary  I reviewed nursing notes, hospitalist notes, last 24 h vitals and pain scores, last 48 h intake and output, last 24 h labs and trends, and last 24 h imaging results.   LOS: 1 day    Henreitta Cea , [facility] Surgery 03/11/2022, 9:31 AM Please see Amion for pager number during day hours 7:00am-4:30pm or 7:00am -11:30am on weekends

## 2022-03-11 NOTE — Progress Notes (Signed)
[place] Progress Note Patient Name: Eric [name] DOB: March 20, 1951 MRN: [id number]   Date of Service  03/11/2022  HPI/Events of Note  Triglyceride 516  eICU Interventions  Transition from propofol to precedex     Intervention Category Intermediate Interventions: Other:  Mauri [place], P 03/11/2022, 5:04 AM

## 2022-03-11 NOTE — Progress Notes (Signed)
Patient with elevated triglycerides on propofol and progressive hypotension with increasingly mottled appearance. Elink notified, 1 amp of bicarb given and drip started. Propofol changed to precedex and additional vasopressors added.

## 2022-03-11 NOTE — Progress Notes (Addendum)
Referring Physician(s): Saverio Danker, PA-C  Supervising Physician: Aletta Edouard  Patient Status:  [facility] : [facility] - In-pt  Chief Complaint:  Septic shock secondary to acute cholecystitis, s/p percutaneous cholecystostomy drain placement 03/09/2022 with Dr Kathlene Cote  Subjective:  Patient intubated and critically ill. Patient's wife [name] at bedside, states no questions about drain at this time.  Allergies: Lipitor [atorvastatin], Lopid [gemfibrozil], and Penicillins  Medications: Prior to Admission medications   Medication Sig Start Date End Date Taking? Authorizing Provider  calcium carbonate (TUMS - DOSED IN MG ELEMENTAL CALCIUM) 500 MG chewable tablet Chew 1,000 mg by mouth daily as needed for indigestion or heartburn.   Yes [provider]  citalopram (CELEXA) 20 MG tablet Take 1 tablet (20 mg total) by mouth daily. 07/16/21 07/16/22 Yes Liane Comber, NP  Cyanocobalamin (B-12 PO) Take 1 capsule by mouth daily.   Yes [provider]  ibuprofen (ADVIL) 200 MG tablet Take 400 mg by mouth every 4 (four) hours as needed for moderate pain.   Yes [provider]  rosuvastatin (CRESTOR) 5 MG tablet TAKE 1 TABLET BY MOUTH DAILY FOR CHOLESTEROL AND PLAQUE ON ARTERIES Patient taking differently: Take 5 mg by mouth daily. 12/02/21  Yes Alycia Rossetti, NP  VITAMIN D, CHOLECALCIFEROL, PO Take 10,000 Units by mouth daily.   Yes [provider]     Vital Signs: BP 105/77   Pulse 73   Temp 99.8 F (37.7 C) (Axillary)   Resp (!) 25   Ht 6' (1.829 m)   Wt 210 lb (95.3 kg)   SpO2 97%   BMI 28.48 kg/m   Physical Exam Vitals reviewed.  Constitutional:      Appearance: He is ill-appearing.  Pulmonary:     Comments: Patient intubated Abdominal:     Palpations: Abdomen is soft.  Skin:    Comments: Suture and stat lock in place. Skin with no signs of bleeding or drainage at insertion site. Dressing clean, dry, and intact.    Drain Location: RUQ Size:  Fr size: 10 Fr Date of placement: 02/20/2022  Currently to: Drain collection device: gravity 24 hour output:  Output by Drain (mL) 03/09/22 0701 - 03/09/22 1900 03/09/22 1901 - 03/17/2022 0700 03/11/2022 0701 - 02/28/2022 1900 03/14/2022 1901 - 03/11/22 0700 03/11/22 0701 - 03/11/22 1515  Biliary Tube Cook slip-coat 10.2 Fr.    150 50    Interval imaging/drain manipulation:  None  Current examination: Flushes/aspirates easily.  Insertion site unremarkable. Suture and stat lock in place. Dressed appropriately.  Fluid not present in bag at time of exam due to RN emptying gravity bag immediately before this provider arrived to the room. RN reported 50cc of greenish fluid present in bag before emptying.     Imaging: DG CHEST PORT 1 VIEW  Result Date: 03/11/2022 CLINICAL DATA:  Encounter for central line. EXAM: PORTABLE CHEST 1 VIEW COMPARISON:  Chest x-ray from the same day. FINDINGS: Right IJ approach central venous catheter with tip projecting at the lower SVC. No visible pneumothorax. Endotracheal tube tip at the level of the clavicular heads. Gastric tube courses below the diaphragm. Mild interstitial prominence. Mild bibasilar opacities IMPRESSION: 1. Right IJ approach central venous catheter with tip projecting at the lower SVC. No visible pneumothorax. 2. Mild interstitial opacities, which could represent interstitial pulmonary edema versus the sequela of recurrent bouts of CHF. 3. Mild bibasilar opacities, which could represent atelectasis and/or consolidation. Electronically Signed   By: Margaretha Sheffield M.D.   On: 03/11/2022  15:07   ECHOCARDIOGRAM COMPLETE  Result Date: 03/11/2022    ECHOCARDIOGRAM REPORT   Patient Name:   Eric [name] Date of Exam: 03/11/2022 Medical Rec #:  [id number]        Height:       72.0 in Accession #:    [id number]       Weight:       210.0 lb Date of Birth:  11/10/1950       BSA:          2.175 m Patient Age:    71 years         BP:           84/65 mmHg Patient  Gender: M                HR:           75 bpm. Exam Location:  Inpatient Procedure: 2D Echo, Intracardiac Opacification Agent, Cardiac Doppler and Color            Doppler Indications:    Elevated troponin  History:        Patient has no prior history of Echocardiogram examinations.                 COPD; Risk Factors:Hypertension and Dyslipidemia. Aortic                 atherosclerosis, emphysema.  Sonographer:    Eartha Inch Referring Phys: [id number] VINEET SOOD  Sonographer Comments: Technically difficult study due to poor echo windows and echo performed with patient supine and on artificial respirator. Image acquisition challenging due to patient body habitus, Image acquisition challenging due to respiratory motion and Image acquisition challenging due to COPD. EF 35-40%. IMPRESSIONS  1. Left ventricular ejection fraction, by estimation, is 65 to 70%. The left ventricle has normal function. The left ventricle has no regional wall motion abnormalities. Left ventricular diastolic parameters were normal.  2. Right ventricular systolic function is normal. The right ventricular size is normal. Tricuspid regurgitation signal is inadequate for assessing PA pressure.  3. The mitral valve was not well visualized. Trivial mitral valve regurgitation. No evidence of mitral stenosis.  4. The aortic valve was not well visualized. Aortic valve regurgitation is not visualized. No aortic stenosis is present.  5. The inferior vena cava is normal in size with greater than 50% respiratory variability, suggesting right atrial pressure of 3 mmHg. Comparison(s): No prior Echocardiogram. Conclusion(s)/Recommendation(s): Very limited echo windows. Grossly normal LVEF, with underfilled appearing ventricle. Normal RV size/function. IVC collapses. Valves not well visualized, but no significant stenosis/regurgitation by Doppler. FINDINGS  Left Ventricle: Left ventricular ejection fraction, by estimation, is 65 to 70%. The left ventricle has  normal function. The left ventricle has no regional wall motion abnormalities. Definity contrast agent was given IV to delineate the left ventricular  endocardial borders. The left ventricular internal cavity size was small. Suboptimal image quality limits for assessment of left ventricular hypertrophy. Left ventricular diastolic parameters were normal. Right Ventricle: The right ventricular size is normal. Right vetricular wall thickness was not well visualized. Right ventricular systolic function is normal. Tricuspid regurgitation signal is inadequate for assessing PA pressure. Left Atrium: Left atrial size was not well visualized. Right Atrium: Right atrial size was not well visualized. Pericardium: There is no evidence of pericardial effusion. Mitral Valve: The mitral valve was not well visualized. Trivial mitral valve regurgitation. No evidence of mitral valve stenosis. Tricuspid Valve: The tricuspid valve is not well visualized.  Tricuspid valve regurgitation is trivial. No evidence of tricuspid stenosis. Aortic Valve: The aortic valve was not well visualized. Aortic valve regurgitation is not visualized. No aortic stenosis is present. Pulmonic Valve: The pulmonic valve was not well visualized. Pulmonic valve regurgitation is not visualized. Aorta: The ascending aorta was not well visualized, the aortic arch was not well visualized and the aortic root was not well visualized. Venous: The inferior vena cava is normal in size with greater than 50% respiratory variability, suggesting right atrial pressure of 3 mmHg. IAS/Shunts: The interatrial septum was not well visualized.  LEFT VENTRICLE PLAX 2D LVIDd:         3.97 cm   Diastology LVIDs:         2.52 cm   LV e' medial:    8.92 cm/s LV PW:         1.20 cm   LV E/e' medial:  8.8 LV IVS:        0.80 cm   LV e' lateral:   13.90 cm/s LVOT diam:     2.30 cm   LV E/e' lateral: 5.7 LV SV:         91 LV SV Index:   42 LVOT Area:     4.15 cm  RIGHT VENTRICLE              IVC RV S prime:     12.80 cm/s  IVC diam: 2.00 cm TAPSE (M-mode): 2.0 cm LEFT ATRIUM           Index        RIGHT ATRIUM           Index LA diam:      3.50 cm 1.61 cm/m   RA Area:     12.60 cm LA Vol (A4C): 56.4 ml 25.93 ml/m  RA Volume:   30.40 ml  13.98 ml/m  AORTIC VALVE LVOT Vmax:   137.00 cm/s LVOT Vmean:  95.700 cm/s LVOT VTI:    0.219 m  AORTA Ao Root diam: 3.40 cm Ao Asc diam:  3.40 cm MITRAL VALVE MV Area (PHT): 3.48 cm    SHUNTS MV Decel Time: 218 msec    Systemic VTI:  0.22 m MR Peak grad: 5.3 mmHg     Systemic Diam: 2.30 cm MR Vmax:      115.00 cm/s MV E velocity: 78.80 cm/s MV A velocity: 32.30 cm/s MV E/A ratio:  2.44 Buford Dresser MD Electronically signed by Buford Dresser MD Signature Date/Time: 03/11/2022/12:21:40 PM    Final    DG CHEST PORT 1 VIEW  Result Date: 03/11/2022 CLINICAL DATA:  Hypoxia EXAM: PORTABLE CHEST 1 VIEW COMPARISON:  03/01/2022 FINDINGS: Cardiac shadow is stable. Endotracheal tube, gastric catheter and left jugular central line are noted in satisfactory position. No focal infiltrate or effusion is seen. No bony abnormality is noted. IMPRESSION: No active disease. Electronically Signed   By: Inez Catalina M.D.   On: 03/11/2022 03:47   DG Abd 1 View  Result Date: 03/11/2022 CLINICAL DATA:  Hypoxia and abdominal pain EXAM: ABDOMEN - 1 VIEW COMPARISON:  None Available. FINDINGS: Cholecystostomy tube is noted in satisfactory position. No renal or ureteral calculi seen. Gastric catheter extends into the stomach. No bony abnormality is seen. IMPRESSION: No acute abnormality noted. Electronically Signed   By: Inez Catalina M.D.   On: 03/11/2022 03:47   DG CHEST PORT 1 VIEW  Result Date: 03/11/2022 CLINICAL DATA:  Septic shock, intubated EXAM: PORTABLE CHEST 1 VIEW COMPARISON:  03/03/2022 FINDINGS: Single frontal view of the chest demonstrates endotracheal tube overlying tracheal air column tip midway between thoracic inlet and carina. Enteric catheter passes  below diaphragm tip excluded by collimation. Left internal jugular catheter tip overlies superior vena cava. External defibrillator pads overlie the left chest. Cardiac silhouette is unremarkable. There is increased central vascular congestion, with developing bilateral perihilar ground-glass airspace disease. No effusion or pneumothorax. No acute bony abnormalities. IMPRESSION: 1. Support devices as above. 2. Increased vascular congestion with progressive bilateral perihilar ground-glass airspace disease, most consistent with fluid overload and mild edema. Electronically Signed   By: Randa Ngo M.D.   On: 03/07/2022 22:29   IR Perc Cholecystostomy  Result Date: 03/04/2022 INDICATION: Acute cholecystitis and sepsis. Percutaneous cholecystostomy E required as the patient is not a candidate for cholecystectomy currently. EXAM: PERCUTANEOUS CHOLECYSTOSTOMY TUBE PLACEMENT MEDICATIONS: None ANESTHESIA/SEDATION: Formal conscious sedation was not performed due to critical illness and poor airway. The patient was given 1.0 mg IV Versed during the procedure. FLUOROSCOPY: Radiation Exposure Index (as provided by the fluoroscopic device): 1.0 mGy Kerma COMPLICATIONS: None immediate. PROCEDURE: Informed written consent was obtained from the patient's wife after a thorough discussion of the procedural risks, benefits and alternatives. All questions were addressed. Maximal Sterile Barrier Technique was utilized including caps, mask, sterile gowns, sterile gloves, sterile drape, hand hygiene and skin antiseptic. A timeout was performed prior to the initiation of the procedure. Ultrasound was used to localize the gallbladder. Local anesthesia was provided with 1% lidocaine. An 18 gauge trocar needle was advanced into the gallbladder lumen under ultrasound guidance. After confirming needle tip position and with return of bile, a guidewire was advanced into the gallbladder lumen and the needle removed. The tract was dilated  to 10 [place]. A 10 French percutaneous drain was advanced into the gallbladder. A sample of bile was then withdrawn through the drain and sent for culture analysis. Contrast was injected via the cholecystostomy tube and a fluoroscopic spot image obtained. The catheter was then flushed with sterile saline and attached to a gravity drainage bag. It was secured at the skin with a Prolene retention suture and StatLock device. FINDINGS: Ultrasound demonstrates inflamed gallbladder with gallbladder wall thickening as well as edematous wall and edema surrounding the gallbladder fossa within the liver. A discrete hepatic abscess is not identified with small pockets of fluid and edema adjacent to the gallbladder within the liver parenchyma. After placement of the cholecystostomy tube there is return of cloudy purulent appearing bile. A sample was sent for culture analysis. IMPRESSION: Placement of percutaneous cholecystostomy tube into the gallbladder. A sample of purulent bile was sent for culture analysis. Initial ultrasound demonstrates adjacent areas of fluid and edema within the liver adjacent to the gallbladder fossa. A discrete unilocular abscess was not identified. Electronically Signed   By: Aletta Edouard M.D.   On: 03/05/2022 16:35   US Abdomen Limited RUQ (LIVER/GB)  Result Date: 03/09/2022 CLINICAL DATA:  Right upper quadrant pain. EXAM: ULTRASOUND ABDOMEN LIMITED RIGHT UPPER QUADRANT COMPARISON:  CT abdomen and pelvis 02/25/2022 FINDINGS: Gallbladder: The gallbladder wall is again thickened, measuring up to 7 mm. The sonographer reports a positive sonographic Murphy's sign" with patient pain when the probe is pressed on the gallbladder. There is again mild pericholecystic fluid. No gallstone is seen. Common bile duct: Diameter: 5 mm, within normal limits. Liver: There is a hypoechoic region with lobular margins just anterior and superior to the gallbladder corresponding to the dense fluid region on today's  CT. This measures up to approximately 4.5 x 3.0 x 4.2 cm. There is heterogeneous hepatic echotexture. Portal vein is patent on color Doppler imaging with normal direction of blood flow towards the liver. Other: None. IMPRESSION: 1. There is again gallbladder wall thickening and pericholecystic fluid. Positive sonographic Murphy's sign. Findings suggest acalculous cholecystitis. Note is made that there was primary or secondary inflammatory change within the adjacent hepatic flexure of the colon on today's CT. It is unclear whether the primary cause of the inflammation is the gallbladder, colon, or both. 2. Mildly complex fluid collection adjacent to the gallbladder fossa as seen on CT. Differential considerations again include a possible complex cyst or abscess. Electronically Signed   By: Yvonne Kendall M.D.   On: 03/02/2022 15:19   CT ABDOMEN PELVIS W CONTRAST  Result Date: 03/12/2022 CLINICAL DATA:  Abdominal pain, acute, nonlocalized EXAM: CT ABDOMEN AND PELVIS WITH CONTRAST TECHNIQUE: Multidetector CT imaging of the abdomen and pelvis was performed using the standard protocol following bolus administration of intravenous contrast. RADIATION DOSE REDUCTION: This exam was performed according to the departmental dose-optimization program which includes automated exposure control, adjustment of the mA and/or kV according to patient size and/or use of iterative reconstruction technique. CONTRAST:  61m OMNIPAQUE IOHEXOL 350 MG/ML SOLN COMPARISON:  None Available. FINDINGS: Lower chest: Bibasilar subsegmental atelectasis. Heart size within normal limits. Gynecomastia is evident on the right. Hepatobiliary: Abnormal appearance of the gallbladder which is moderately dilated with wall thickening, pericholecystic fluid, and pericholecystic fat stranding. No hyperdense gallstone. There is an irregular hypoattenuating lesion within the liver adjacent to the gallbladder fossa measuring approximately 4.4 x 2.5 x 3.6 cm  with internal density slightly greater than fluid. Liver appears otherwise unremarkable. No intrahepatic biliary dilatation. Pancreas: Unremarkable. No pancreatic ductal dilatation or surrounding inflammatory changes. Spleen: Normal in size without focal abnormality. Adrenals/Urinary Tract: Unremarkable adrenal glands. Kidneys enhance symmetrically without solid lesion, stone, or hydronephrosis. Ureters are nondilated. Urinary bladder is decompressed by Foley catheter. Stomach/Bowel: Long segment wall thickening of the colon centered at the hepatic flexure, which may be reactive secondary to local inflammation associated with the gallbladder. Sigmoid diverticulosis. Normal appendix in the right lower quadrant. No dilated loops of bowel. Small hiatal hernia. Vascular/Lymphatic: Scattered aortoiliac atherosclerotic calcifications without aneurysm. No abdominopelvic lymphadenopathy. Reproductive: Prostate is unremarkable. Other: No ascites. No abdominopelvic fluid collection. No pneumoperitoneum. No abdominal wall hernia. Musculoskeletal: No acute or significant osseous findings. IMPRESSION: 1. Appearance of the gallbladder is highly suspicious for acute cholecystitis. Consider further evaluation with right upper quadrant ultrasound. 2. Irregular low-density lesion within the liver adjacent to the gallbladder fossa measuring up to 4.4 cm. This could represent a hepatic abscess, complex cyst, or neoplasm. 3. Long segment wall thickening of the colon centered at the hepatic flexure, which may be reactive secondary to local inflammation associated with the gallbladder versus a nonspecific colitis. 4. Sigmoid diverticulosis without evidence of acute diverticulitis. 5. Aortic Atherosclerosis (ICD10-I70.0). Electronically Signed   By: NDavina PokeD.O.   On: 03/14/2022 14:02   CT Angio Chest PE W and/or Wo Contrast  Result Date: 02/20/2022 CLINICAL DATA:  Chest pain. EXAM: CT ANGIOGRAPHY CHEST WITH CONTRAST  TECHNIQUE: Multidetector CT imaging of the chest was performed using the standard protocol during bolus administration of intravenous contrast. Multiplanar CT image reconstructions and MIPs were obtained to evaluate the vascular anatomy. RADIATION DOSE REDUCTION: This exam was performed according to the departmental dose-optimization program which includes automated exposure control, adjustment of the mA and/or  kV according to patient size and/or use of iterative reconstruction technique. CONTRAST:  2m OMNIPAQUE IOHEXOL 350 MG/ML SOLN COMPARISON:  June 28, 2018. FINDINGS: Cardiovascular: Satisfactory opacification of the pulmonary arteries to the segmental level. No evidence of pulmonary embolism. Normal heart size. No pericardial effusion. Mediastinum/Nodes: No enlarged mediastinal, hilar, or axillary lymph nodes. Thyroid gland, trachea, and esophagus demonstrate no significant findings. Lungs/Pleura: Lungs are clear. No pleural effusion or pneumothorax. Upper Abdomen: No acute abnormality. Musculoskeletal: No chest wall abnormality. No acute or significant osseous findings. Review of the MIP images confirms the above findings. IMPRESSION: No definite evidence of pulmonary embolus. No acute abnormality seen in the chest. Electronically Signed   By: JMarijo ConceptionM.D.   On: 02/23/2022 13:58   CT Head Wo Contrast  Result Date: 03/07/2022 CLINICAL DATA:  Head trauma. EXAM: CT HEAD WITHOUT CONTRAST TECHNIQUE: Contiguous axial images were obtained from the base of the skull through the vertex without intravenous contrast. RADIATION DOSE REDUCTION: This exam was performed according to the departmental dose-optimization program which includes automated exposure control, adjustment of the mA and/or kV according to patient size and/or use of iterative reconstruction technique. COMPARISON:  None Available. FINDINGS: Brain: No evidence of acute infarction, hemorrhage, hydrocephalus, extra-axial collection or mass  lesion/mass effect. There are sequela of moderate chronic microvascular ischemic change with advanced generalized volume loss. Vascular: No hyperdense vessel or unexpected calcification. Skull: Normal. Negative for fracture or focal lesion. Sinuses/Orbits: Bilateral lens replacement. Mild mucosal thickening bilateral maxillary and ethmoid sinuses, as well as right sphenoid sinus. Other: None. IMPRESSION: No CT evidence of acute intracranial injury. Electronically Signed   By: HMarin RobertsM.D.   On: 03/18/2022 13:51   DG Chest Port 1 View  Result Date: 03/14/2022 CLINICAL DATA:  Provided history: Questionable sepsis-evaluate for abnormality. EXAM: PORTABLE CHEST 1 VIEW COMPARISON:  Prior chest radiographs 09/12/2020 and earlier. FINDINGS: Heart size within normal limits. Aortic atherosclerosis. Minimal atelectasis within the right lung base. No appreciable airspace consolidation or pulmonary edema. No evidence of pleural effusion or pneumothorax. No acute bony abnormality identified. IMPRESSION: Minimal atelectasis within the right lung base. Otherwise, no evidence of acute cardiopulmonary abnormality. Aortic Atherosclerosis (ICD10-I70.0). Electronically Signed   By: KKellie SimmeringD.O.   On: 03/07/2022 12:23    Labs:  CBC: Recent Labs    07/16/21 1140 01/29/22 1504 02/24/2022 1104 02/27/2022 1128 03/09/2022 1454 02/25/2022 2121 03/20/2022 2310 03/11/22 0101 03/11/22 0336 03/11/22 0532  WBC 6.5 6.6 6.9  --   --   --   --   --  25.5*  --   HGB 16.5 16.5 14.3   < >  --    < > 13.3 12.2* 13.4 12.9*  HCT 48.0 47.5 42.8   < >  --    < > 39.0 36.0* 38.5* 38.0*  PLT 273 286 30*  --  25*  --   --   --  28*  --    < > = values in this interval not displayed.    COAGS: Recent Labs    04/09/21 1423 03/14/2022 1104 03/18/2022 1454 03/11/22 0336  INR 1.1 1.9* 1.7* 1.9*  APTT  --  42* 37*  --     BMP: Recent Labs    05/06/21 0324 07/16/21 1140 01/29/22 1504 02/20/2022 1104 03/17/2022 1128 03/06/2022 1620  03/19/2022 2121 03/09/2022 2310 03/11/22 0101 03/11/22 0336 03/11/22 0532  NA 135   < > 139 136 134* 133*   < > 132* 129*  133* 130*  K 4.3   < > 4.4 2.8* 2.8* 3.0*   < > 3.8 4.1 4.7 4.9  CL 105   < > 101 99 101 96*  --   --   --  101  --   CO2 24   < > 27 14*  --  21*  --   --   --  12*  --   GLUCOSE 135*   < > 91 105* 96 121*  --   --   --  149*  --   BUN 15   < > 15 61* 56* 63*  --   --   --  68*  --   CALCIUM 8.7*   < > 10.0 8.7*  --  8.4*  --   --   --  8.0*  --   CREATININE 1.05   < > 1.18 4.86* 5.20* 4.95*  --   --   --  5.45*  --   GFRNONAA >60  --   --  12*  --  12*  --   --   --  11*  --    < > = values in this interval not displayed.    LIVER FUNCTION TESTS: Recent Labs    07/16/21 1140 01/29/22 1504 03/19/2022 1104 03/11/22 0336  BILITOT 0.7 0.8 3.3* 3.8*  AST 23 19 95* 198*  ALT 26 27 45* 74*  ALKPHOS  --   --  520* 198*  PROT 7.2 7.1 5.0* 4.6*  ALBUMIN  --   --  2.2* 1.8*    Assessment and Plan:  Eric [name] is a 71 yo male  with septic shock likely secondary to acute cholecystitis s/p percutaneous cholecystostomy placement on 03/08/2022. Patient remains critically ill at this time.  Plan: Continue TID flushes with 5 cc NS. Record output Q shift. Dressing changes QD or PRN if soiled.  Call IR APP or on call IR MD if difficulty flushing or sudden change in drain output.  Repeat imaging/possible drain injection once output < 10 mL/QD (excluding flush material). Consideration for drain removal if output is < 10 mL/QD (excluding flush material), pending discussion with the providing surgical service.  Discharge planning: Please contact IR APP or on call IR MD prior to patient d/c to ensure appropriate follow up plans are in place. Typically patient will follow up with IR clinic 10-14 days post d/c for repeat imaging/possible drain injection. IR scheduler will contact patient with date/time of appointment. Patient will need to flush drain QD with 5 cc NS, record output  QD, dressing changes every 2-3 days or earlier if soiled.   IR will continue to follow - please call with questions or concerns.  Electronically Signed: Lura Em, PA-C 03/11/2022, 3:10 PM   I spent a total of 15 Minutes at the the patient's bedside AND on the patient's hospital floor or unit, greater than 50% of which was counseling/coordinating care for acute cholecystitis s/p perc chole drain placement on 03/20/2022.

## 2022-03-11 NOTE — Progress Notes (Signed)
Initial Nutrition Assessment  DOCUMENTATION CODES:   Not applicable  INTERVENTION:   Recommend initiating enteral nutrition as medically feasible via OG tube: Initiate Vital 1.5 at 25 mL/hr and advance by 10 mL q8h to goal rate of 55 mL/hr (1320 mL/day) 60 mL ProSource TF20 - BID Provides 2140 kcal, 129 gm protein, and 1008 mL free water daily.   NUTRITION DIAGNOSIS:   Inadequate oral intake related to inability to eat as evidenced by NPO status.  GOAL:   Patient will meet greater than or equal to 90% of their needs  MONITOR:   Vent status, Labs, I & O's  REASON FOR ASSESSMENT:   Ventilator    ASSESSMENT:   71 y.o. male presented to the ED with abdominal pain, nausea, and vomiting. PMH includes MDD, HLD, GERD, and EtOH abuse. Pt admitted with septic shock, cholecystitis, AKI, and hypoxia respiratory failure.   9/19 - Cholecystostomy by IR; intubated  Wife present at bedside, opted not to talk to RD at this time.  Discussed case with RN outside of room. Possible need for CRRT, nephrology following.   Patient is currently intubated on ventilator support. MV: 15.2 L/min MAP (a-line): 77 Temp (24hrs), Avg:99.8 F (37.7 C), Min:98.4 F (36.9 C), Max:101.5 F (38.6 C) Continuous Medications:  Precedex Epinephrine Levophed Phenylephrine Vasopressin Sodium Bicarb   Medications reviewed and include: NovoLog SSI, Protonix, IV antibiotics  Labs reviewed: Sodium 130, BUN 68, Creatinine 5.45, Magnesium 2.5, 24 hr CBG 46-143  NUTRITION - FOCUSED PHYSICAL EXAM:  Flowsheet Row Most Recent Value  Orbital Region Unable to assess  Upper Arm Region No depletion  Thoracic and Lumbar Region No depletion  Buccal Region Unable to assess  Temple Region Unable to assess  Clavicle Bone Region No depletion  Clavicle and Acromion Bone Region Unable to assess  Scapular Bone Region Unable to assess  Dorsal Hand Unable to assess  Patellar Region No depletion  Anterior Thigh  Region No depletion  Posterior Calf Region No depletion  Edema (RD Assessment) Mild   Diet Order:   Diet Order             Diet NPO time specified  Diet effective now                   EDUCATION NEEDS:   Not appropriate for education at this time  Skin:  Skin Assessment: Reviewed RN Assessment  Last BM:  9/18  Height:   Ht Readings from Last 1 Encounters:  02/24/2022 6' (1.829 m)    Weight:   Wt Readings from Last 1 Encounters:  02/23/2022 95.3 kg    Ideal Body Weight:  80.9 kg  BMI:  Body mass index is 28.48 kg/m.  Estimated Nutritional Needs:   Kcal:  2000-2200  Protein:  100-120 grams  Fluid:  >/= 2 L    Hermina Barters RD, LDN Clinical Dietitian See [facility] for contact information.

## 2022-03-11 NOTE — Progress Notes (Signed)
  Echocardiogram 2D Echocardiogram has been performed.  Eric [name] 03/11/2022, 9:31 AM

## 2022-03-11 NOTE — Procedures (Signed)
Central Venous Catheter Insertion Procedure Note  Eric [name]  [id number]  1951-04-25  Date:03/11/22  Time:4:07 PM   Provider Performing:Khyrie Masi A  [name]   Procedure: Insertion of Non-tunneled Central Venous [phone]) with US guidance ([id number])   Indication(s) Hemodialysis  Consent Risks of the procedure as well as the alternatives and risks of each were explained to the patient and/or caregiver.  Consent for the procedure was obtained and is signed in the bedside chart  Anesthesia Topical only with 1% lidocaine   Timeout Verified patient identification, verified procedure, site/side was marked, verified correct patient position, special equipment/implants available, medications/allergies/relevant history reviewed, required imaging and test results available.  Sterile Technique Maximal sterile technique including full sterile barrier drape, hand hygiene, sterile gown, sterile gloves, mask, hair covering, sterile ultrasound probe cover (if used).  Procedure Description Area of catheter insertion was cleaned with chlorhexidine and draped in sterile fashion.  With real-time ultrasound guidance a HD catheter was placed into the right internal jugular vein. Nonpulsatile blood flow and easy flushing noted in all ports.  The catheter was sutured in place and sterile dressing applied.  Complications/Tolerance None; patient tolerated the procedure well. Chest X-ray is ordered to verify placement for internal jugular or subclavian cannulation.   Chest x-ray is not ordered for femoral cannulation.  EBL Minimal  Specimen(s) None

## 2022-03-11 NOTE — TOC Initial Note (Signed)
Transition of Care [facility]) - Initial/Assessment Note    Patient Details  Name: Eric [name] MRN: [id number] Date of Birth: 25-Dec-1950  Transition of [facility]) CM/SW Contact:    Eric [name], Eric Ee, RN Phone Number: 03/11/2022, 1:34 PM  Clinical Narrative:                  CM spoke with wife, Eric [name] and daughter, Eric [name] at bedside. Patient is admitted for Sepsis 2/2 Cholecystitis. Had  Percutaneous Cholecystostomy yesterday 03/07/2022 by IR. Patient is currently intubated, on drips and IV abx.  Eric [name] states patient is retired and has one daughter. Independent and very active prior to admission. Does not have DME's at home.  PCP is Eric Pinto, MD and uses [place]. No TOC needs or recommendations noted at this time. CM will continue to follow as patient progresses with care towards discharge.   Barriers to Discharge: Continued Medical Work up   Patient Goals and CMS Choice Patient states their goals for this hospitalization and ongoing recovery are:: To return home      Expected Discharge Plan and Services     Discharge Planning Services: CM Consult   Living arrangements for the past 2 months: Single Family Home                                      Prior Living Arrangements/Services Living arrangements for the past 2 months: Single Family Home Lives with:: Spouse Patient language and need for interpreter reviewed:: Yes Do you feel safe going back to the place where you live?: Yes      Need for Family Participation in Patient Care: Yes (Comment) Care giver support system in place?: Yes (comment)   Criminal Activity/Legal Involvement Pertinent to Current Situation/Hospitalization: No - Comment as needed  Activities of Daily Living      Permission Sought/Granted Permission sought to share information with : Case Manager, Family Supports Permission granted to share information with : Yes, Verbal Permission Granted               Emotional Assessment Appearance:: Appears stated age Attitude/Demeanor/Rapport: Unable to Assess (Intubated) Affect (typically observed): Unable to Assess (Intubated) Orientation: : Oriented to Self Alcohol / Substance Use: Not Applicable Psych Involvement: No (comment)  Admission diagnosis:  Septic shock ([place]) [A41.9, R65.21] Patient Active Problem List   Diagnosis Date Noted   Septic shock ([place]) 02/25/2022   Chronic respiratory failure with hypoxia ([place])    History of colon polyps 07/16/2021   S/P total knee replacement, right 07/16/2021   B12 deficiency 09/13/2020   History of basal cell cancer 06/13/2019   BPH with obstruction/lower urinary tract symptoms 01/04/2019   Atherosclerosis of aorta ([place]) by Chest CT scan in 06/2018 06/28/2018   Emphysema of lung ([place]) 06/28/2018   History of smoking greater than 50 pack years (quit 2006) 06/08/2018   Overweight (BMI 25.0-29.9) 06/07/2018   Medication management 10/11/2014   Major depression, recurrent, chronic ([place]) 04/06/2014   Labile hypertension    Abnormal glucose    Hyperlipidemia, mixed    Vitamin D deficiency    PCP:  Eric Pinto, MD Pharmacy:   [facility], [address] [place] [address] Phone: [phone] Fax: [place], [address] [place] [address]  Phone: [phone] Fax: [phone]     Social Determinants of Health (SDOH) Interventions    Readmission Risk Interventions     No data to display

## 2022-03-11 NOTE — Progress Notes (Signed)
NAME:  Eric [name], MRN:  [id number], DOB:  [birth year], LOS: 1 ADMISSION DATE:  02/21/2022,  Attending  MD:  Freda Jackson, CHIEF COMPLAINT:  Septic shock secondary to cholecystitis s/p VIR Cholecystostomy   History of Present Illness:  71 year old male with PMH of HLD, labile HTN, remote smoking history who presented from home with abdominal pain.  Patient reports he had sudden onset of abdominal pain with nausea and vomiting on 9/14 in which he attributed to food poisoning.  Has only been able to tolerate liquids, saltine crackers, and some protein shakes over the weekend.  Tried eating soup yesterday and vomited that up with one episode of diarrhea and had chills.  Denies fevers, SOB, or chest pain.  Has abrasion to left knee, patient does not remember how he obtained that.  Reports not voiding as normal.  Given worsening abdominal pain, he presented to ER.    Arrived in ER in Afib with RVR and normotensive initially.  Was placed on esmolol for rate control then became hypotensive requiring emergent cardioversion back to ST.  Ongoing hypotension despite 3L IVF, therefore started on norepinephrine.    Workup in ER noted for K 2.8, sCr 4.86 (previously 1.18 in 01/2022), bicarb 14, iCa 1.07, alk phos 520, AST/ ALT 95/45, t. Bili 3.3, trop hs 821, WBC 6.9, Hgb 14.3, plts 30, lactic acid > 9 with repeat 8.9.  CTA PE was neg for PE with no acute abnormalities in the chest, CTH neg, and CT a/p w/contrast concerning for acute cholecystitis and liver lesion adjacent to the gallbladder which could represent hepatic abscess, complex cyst, or neoplasm.  RUQ US showed gallbladder thickening and pericholecystic fluid, positive Murphy's sign suggestive of acalculous cholecystitis, mildly complex fluid collection adjacent to gallbladder seen again.  Cultures sent and started on empiric abx.  Treated with calcium, bicarb push then drip, and KCL.  Surgery and IR were consulted and IR performed a percutaneous  cholecystostomy with drain in place.   Pertinent  Medical History  HLD Labile HTN Emphysema of the lung Hx of Basal Cell Cancer Remote but significant smoking Hx Depression  Significant Hospital Events: Including procedures, antibiotic start and stop dates in addition to other pertinent events   9/19: Arterial line place, IR percutaneous cholecystostomy, CVC catheter placement, intubation for respiratory failure  Interim History / Subjective:  Patient was admitted to 74M given worsening septic shock 2/2 cholecystitis that required pressors, intubation and central line placement. His clinical status deteriorated throughout the night. Pressures were initially stable on Levophed; however, patient developed Afib with RVR to 140-160, so Levophed was paused and phenylephrine and vasopression were started for ongoing BP support. Patient was later transitioned from propofol to precedex given ongoing hypotension and levophed was restarted in addition to Phenylephrine and Vaso. Patient had a worsening acidosis and a bicarb drip was initiated. Nursing reports progressive lower extremity mottling throughout the night. Wife, daughter and son-in-law were all at bedside this morning and were updated with status.   Objective   Blood pressure (!) 84/65, pulse 74, temperature 98.6 F (37 C), temperature source Oral, resp. rate (!) 30, height 6' (1.829 m), weight 95.3 kg, SpO2 95 %.    Vent Mode: PRVC FiO2 (%):  [40 %-100 %] 40 % Set Rate:  [24 bmp] 24 bmp Vt Set:  [620 mL] 620 mL PEEP:  [8 cmH20] 8 cmH20 Plateau Pressure:  [19 cmH20-22 cmH20] 22 cmH20   Intake/Output Summary (Last 24 hours) at 03/11/2022 1009  Last data filed at 03/11/2022 0900 Gross per 24 hour  Intake 9667.01 ml  Output 360 ml  Net 9307.01 ml   Filed Weights   02/28/2022 1115  Weight: 95.3 kg    Examination: General: Critically ill appearing, intubated and sedated and lying in bed HENT: perioral/Auricular cyanosis, CVC present in  left IJV.  Lungs: Clear to ausculation in lung fields, breathing over ventilator rate Cardiovascular: Difficult exam, bedside echo demonstrated left ventricle global hypokinesis Abdomen: Percutaneous Drain present in RUQ, draining dark yellow purulent drainage, drain in patent. Non tympanic, Non distended, soft.  Extremities: Severely mottled bilateral lower extremities, no dorsalis pedis pulses auscultated bilaterally (doppler). Mottling is present to hips and includes penis.  Neuro: Sedated on precedex and fentanyl GU: Foley in place  Resolved Hospital Problem list     Assessment & Plan:  Septic shock  2/2  acute acalculous cholecystitis s/p IR perc. Cholecystostomy Patient is critically ill,  Currently on: Levophed 40 mcg/min, Phenylephrine, and Vasopressin 0.03 unit/min with persistent hypotension. Lactate at 9 from 4.2, most recent VBG with pH of 7.22.  - Continue NE, with MAP goal 65-75, titrate as possible - Increase Vaso to 0.04 u/min with MAP goal 65-75, titrate as possible - WEAN Phenylephrine as able given ongoing mottling/severe vasoconstriction  - Consider EPI  for additional beta stimulation and BP support if hypotensive with NE/Vaso with awareness of concomitant AFIB w/ RVR and potential exacerbation by EPI - Fluid replace as needed - follow cultures - Continue cefepime, vanc, and flagyl - daily CBC/ CMET/ coags  - Trend lactate   AKI  Hypokalemia  AGMA/ lactic acidosis AKI likely pre-renal from poor PO intake, hypotension. Of note, pt did get IV contrast. Patient's elevated liver enzymes also likely secondary to hypotension. UA with mod Hgb, no RBC, 100 protein, neg leuk/ nitrates, follow UC. S/p 2 runs of KCL in ER. No hydronephrosis/ blockage noted on CT a/p - continue bicarb gtt - Give Additional 37mq Bicarb push dose  - strict I/Os - avoid further nephrotoxins  - Continue foley   Hypoxia respiratory failure  Patient intubated given respiratory failure. CXR and  CTA PE neg for acute process. Patient breathing over vent respiratory setting. Vent on PRVC: 30% Fio2, 24 RR, Vt set: 450 mL, PEEP 5cmh2o,   - Start fentanyl gtt 544m/min for additional sedation    New onset Afib with RVR Elevated hs troponin New onset Afib with no prior hx, s/p cardioversion in ER. Amiodarone started overnight. Elevated troponin likely secondary to demand in context of shock. Bedside echo demonstrated global decreased LV function, formal TTE ordered.  - Continue Amiodarone 30 mg/hour - Follow formal TTE - ideally goal K> 4, Mag> 2   Coagulapathy, elevated INR Thrombocytopenia DIC panel wnl, coagulopathy likely related to sepsis.  - Repeat D-dimer, fibrinogen tomorrow   HLD - hold crestor with transaminitis   Best Practice (right click and "Reselect all SmartList Selections" daily)   Diet/type: Will assess tomorrow DVT prophylaxis: SCD, GI prophylaxis: PPI Lines: Central line, Arterial Line, and yes and it is still needed Foley:  Yes, and it is still needed Code Status:  full code Last date of multidisciplinary goals of care discussion [9/19,9/20] Wife, Son-in-law at bedside, updated on plan of care  Labs   CBC: Recent Labs  Lab 03/19/2022 1104 02/26/2022 1128 03/01/2022 1454 03/06/2022 2121 03/05/2022 2310 03/11/22 0101 03/11/22 0336 03/11/22 0532  WBC 6.9  --   --   --   --   --  25.5*  --   NEUTROABS 4.8  --   --   --   --   --   --   --   HGB 14.3   < >  --  12.6* 13.3 12.2* 13.4 12.9*  HCT 42.8   < >  --  37.0* 39.0 36.0* 38.5* 38.0*  MCV 102.1*  --   --   --   --   --  100.0  --   PLT 30*  --  25*  --   --   --  28*  --    < > = values in this interval not displayed.    Basic Metabolic Panel: Recent Labs  Lab 03/09/2022 1104 02/23/2022 1128 02/28/2022 1236 03/19/2022 1620 03/08/2022 2121 02/23/2022 2310 03/11/22 0101 03/11/22 0336 03/11/22 0532  NA 136 134*  --  133* 131* 132* 129* 133* 130*  K 2.8* 2.8*  --  3.0* 3.7 3.8 4.1 4.7 4.9  CL 99 101  --   96*  --   --   --  101  --   CO2 14*  --   --  21*  --   --   --  12*  --   GLUCOSE 105* 96  --  121*  --   --   --  149*  --   BUN 61* 56*  --  63*  --   --   --  68*  --   CREATININE 4.86* 5.20*  --  4.95*  --   --   --  5.45*  --   CALCIUM 8.7*  --   --  8.4*  --   --   --  8.0*  --   MG  --   --  1.9  --   --   --   --  2.5*  --    GFR: Estimated Creatinine Clearance: 14.9 mL/min (A) (by C-G formula based on SCr of 5.45 mg/dL (H)). Recent Labs  Lab 03/01/2022 1104 02/26/2022 1300 03/09/2022 1620 03/05/2022 1951 03/11/22 0142 03/11/22 0336 03/11/22 0739  WBC 6.9  --   --   --   --  25.5*  --   LATICACIDVEN >9.0*   < > 5.0* 4.6* 4.2*  --  >9.0*   < > = values in this interval not displayed.    Liver Function Tests: Recent Labs  Lab 03/09/2022 1104 03/11/22 0336  AST 95* 198*  ALT 45* 74*  ALKPHOS 520* 198*  BILITOT 3.3* 3.8*  PROT 5.0* 4.6*  ALBUMIN 2.2* 1.8*   Recent Labs  Lab 03/11/22 0336  LIPASE 26   No results for input(s): "AMMONIA" in the last 168 hours.  ABG    Component Value Date/Time   PHART 7.270 (L) 03/11/2022 0532   PCO2ART 23.0 (L) 03/11/2022 0532   PO2ART 139 (H) 03/11/2022 0532   HCO3 10.5 (L) 03/11/2022 0532   TCO2 11 (L) 03/11/2022 0532   ACIDBASEDEF 14.0 (H) 03/11/2022 0532   O2SAT 100 03/11/2022 0819     Coagulation Profile: Recent Labs  Lab 03/09/2022 1104 02/22/2022 1454 03/11/22 0336  INR 1.9* 1.7* 1.9*    Cardiac Enzymes: No results for input(s): "CKTOTAL", "CKMB", "CKMBINDEX", "TROPONINI" in the last 168 hours.  HbA1C: Hgb A1c MFr Bld  Date/Time Value Ref Range Status  01/29/2022 03:04 PM 5.6 <5.7 % of total Hgb Final    Comment:    For the purpose of screening for the presence of diabetes: . <5.7%  Consistent with the absence of diabetes 5.7-6.4%    Consistent with increased risk for diabetes             (prediabetes) > or =6.5%  Consistent with diabetes . This assay result is consistent with a decreased risk of  diabetes. . Currently, no consensus exists regarding use of hemoglobin A1c for diagnosis of diabetes in children. . According to American Diabetes Association (ADA) guidelines, hemoglobin A1c <7.0% represents optimal control in non-pregnant diabetic patients. Different metrics may apply to specific patient populations.  Standards of Medical Care in Diabetes(ADA). Marland Kitchen   04/30/2021 11:02 AM 5.3 4.8 - 5.6 % Final    Comment:    (NOTE) Pre diabetes:          5.7%-6.4%  Diabetes:              >6.4%  Glycemic control for   <7.0% adults with diabetes     CBG: Recent Labs  Lab 03/07/2022 2357 03/11/22 0339 03/11/22 0341 03/11/22 0752 03/11/22 0914  GLUCAP 76 46* 143* 47* 141*   Past Medical History:  He,  has a past medical history of Arteriosclerosis of aorta ([place]), Arthritis, Basal cell carcinoma, GERD (gastroesophageal reflux disease), Hyperlipidemia, Labile hypertension, OA (osteoarthritis) of knee (11/25/2020), Other testicular hypofunction, Prediabetes, and Vitamin D deficiency.   Surgical History:   Past Surgical History:  Procedure Laterality Date   CATARACT EXTRACTION, BILATERAL Bilateral 2019   Dr. Carmell Austria   dental implant surgery      IR PERC CHOLECYSTOSTOMY  03/02/2022   KNEE ARTHROSCOPY Left 1988   SKIN CANCER EXCISION     Ear, forehead, nose   TOTAL KNEE ARTHROPLASTY Left 11/25/2020   Procedure: TOTAL KNEE ARTHROPLASTY;  Surgeon: Gaynelle Arabian, MD;  Location: [facility];  Service: Orthopedics;  Laterality: Left;  61mn   TOTAL KNEE ARTHROPLASTY Right 05/05/2021   Procedure: TOTAL KNEE ARTHROPLASTY;  Surgeon: AGaynelle Arabian MD;  Location: [facility];  Service: Orthopedics;  Laterality: Right;     Social History:   reports that he quit smoking about 17 years ago. His smoking use included cigarettes. He started smoking about 53 years ago. He has a 55.50 pack-year smoking history. He has never used smokeless tobacco. He reports current alcohol use of about 2.0 - 3.0  standard drinks of alcohol per week. He reports that he does not use drugs.   Family History:  His family history includes Alzheimer's disease in his mother; Colon polyps in his father; Diabetes in his mother; Heart disease in his father; Hypertension in his father; Stroke in his father.   Allergies Allergies  Allergen Reactions   Lipitor [Atorvastatin] Other (See Comments)    Cloudy headed    Lopid [Gemfibrozil] Other (See Comments)    Unknown reaction. Patient doesn't remember taking this medication.   Penicillins Other (See Comments)    Unknown childhood reaction Tolerated Cephalosporin Date: 11/26/20. Tolerated Cephalosporin Date: 05/06/21.       Home Medications  Prior to Admission medications   Medication Sig Start Date End Date Taking? Authorizing Provider  calcium carbonate (TUMS - DOSED IN MG ELEMENTAL CALCIUM) 500 MG chewable tablet Chew 1,000 mg by mouth daily as needed for indigestion or heartburn.   Yes [provider]  citalopram (CELEXA) 20 MG tablet Take 1 tablet (20 mg total) by mouth daily. 07/16/21 07/16/22 Yes CLiane Comber NP  Cyanocobalamin (B-12 PO) Take 1 capsule by mouth daily.   Yes [provider]  ibuprofen (ADVIL) 200 MG tablet  Take 400 mg by mouth every 4 (four) hours as needed for moderate pain.   Yes [provider]  rosuvastatin (CRESTOR) 5 MG tablet TAKE 1 TABLET BY MOUTH DAILY FOR CHOLESTEROL AND PLAQUE ON ARTERIES Patient taking differently: Take 5 mg by mouth daily. 12/02/21  Yes Alycia Rossetti, NP  VITAMIN D, CHOLECALCIFEROL, PO Take 10,000 Units by mouth daily.   Yes [provider]     Critical care time:

## 2022-03-12 ENCOUNTER — Inpatient Hospital Stay (HOSPITAL_COMMUNITY): Payer: PPO

## 2022-03-12 DIAGNOSIS — A419 Sepsis, unspecified organism: Secondary | ICD-10-CM | POA: Diagnosis not present

## 2022-03-12 DIAGNOSIS — R6521 Severe sepsis with septic shock: Secondary | ICD-10-CM | POA: Diagnosis not present

## 2022-03-12 LAB — POCT I-STAT 7, (LYTES, BLD GAS, ICA,H+H)
Acid-base deficit: 3 mmol/L — ABNORMAL HIGH (ref 0.0–2.0)
Acid-base deficit: 1 mmol/L (ref 0.0–2.0)
Acid-base deficit: 2 mmol/L (ref 0.0–2.0)
Bicarbonate: 20.4 mmol/L (ref 20.0–28.0)
Bicarbonate: 21.5 mmol/L (ref 20.0–28.0)
Bicarbonate: 21.2 mmol/L (ref 20.0–28.0)
Calcium, Ion: 0.82 mmol/L — CL (ref 1.15–1.40)
Calcium, Ion: 0.81 mmol/L — CL (ref 1.15–1.40)
Calcium, Ion: 0.78 mmol/L — CL (ref 1.15–1.40)
HCT: 36 % — ABNORMAL LOW (ref 39.0–52.0)
HCT: 37 % — ABNORMAL LOW (ref 39.0–52.0)
HCT: 36 % — ABNORMAL LOW (ref 39.0–52.0)
Hemoglobin: 12.2 g/dL — ABNORMAL LOW (ref 13.0–17.0)
Hemoglobin: 12.6 g/dL — ABNORMAL LOW (ref 13.0–17.0)
Hemoglobin: 12.2 g/dL — ABNORMAL LOW (ref 13.0–17.0)
O2 Saturation: 95 %
O2 Saturation: 97 %
O2 Saturation: 97 %
Patient temperature: 98.6
Patient temperature: 99.1
Patient temperature: 99.1
Potassium: 3.7 mmol/L (ref 3.5–5.1)
Potassium: 3.7 mmol/L (ref 3.5–5.1)
Potassium: 3.7 mmol/L (ref 3.5–5.1)
Sodium: 124 mmol/L — ABNORMAL LOW (ref 135–145)
Sodium: 127 mmol/L — ABNORMAL LOW (ref 135–145)
Sodium: 129 mmol/L — ABNORMAL LOW (ref 135–145)
TCO2: 21 mmol/L — ABNORMAL LOW (ref 22–32)
TCO2: 22 mmol/L (ref 22–32)
TCO2: 22 mmol/L (ref 22–32)
pCO2 arterial: 29.1 mmHg — ABNORMAL LOW (ref 32–48)
pCO2 arterial: 29 mmHg — ABNORMAL LOW (ref 32–48)
pCO2 arterial: 31.6 mmHg — ABNORMAL LOW (ref 32–48)
pH, Arterial: 7.453 — ABNORMAL HIGH (ref 7.35–7.45)
pH, Arterial: 7.479 — ABNORMAL HIGH (ref 7.35–7.45)
pH, Arterial: 7.436 (ref 7.35–7.45)
pO2, Arterial: 71 mmHg — ABNORMAL LOW (ref 83–108)
pO2, Arterial: 83 mmHg (ref 83–108)
pO2, Arterial: 89 mmHg (ref 83–108)

## 2022-03-12 LAB — CBC WITH DIFFERENTIAL/PLATELET
Abs Immature Granulocytes: 0.51 10*3/uL — ABNORMAL HIGH (ref 0.00–0.07)
Basophils Absolute: 0 10*3/uL (ref 0.0–0.1)
Basophils Relative: 0 %
Eosinophils Absolute: 0 10*3/uL (ref 0.0–0.5)
Eosinophils Relative: 0 %
HCT: 36.5 % — ABNORMAL LOW (ref 39.0–52.0)
Hemoglobin: 12.6 g/dL — ABNORMAL LOW (ref 13.0–17.0)
Immature Granulocytes: 2 %
Lymphocytes Relative: 7 %
Lymphs Abs: 2.1 10*3/uL (ref 0.7–4.0)
MCH: 34.1 pg — ABNORMAL HIGH (ref 26.0–34.0)
MCHC: 34.5 g/dL (ref 30.0–36.0)
MCV: 98.6 fL (ref 80.0–100.0)
Monocytes Absolute: 0.7 10*3/uL (ref 0.1–1.0)
Monocytes Relative: 3 %
Neutro Abs: 26.2 10*3/uL — ABNORMAL HIGH (ref 1.7–7.7)
Neutrophils Relative %: 88 %
Platelets: 25 10*3/uL — CL (ref 150–400)
RBC: 3.7 MIL/uL — ABNORMAL LOW (ref 4.22–5.81)
RDW: 14.2 % (ref 11.5–15.5)
Smear Review: NORMAL
WBC: 29.6 10*3/uL — ABNORMAL HIGH (ref 4.0–10.5)
nRBC: 0.1 % (ref 0.0–0.2)

## 2022-03-12 LAB — GLUCOSE, CAPILLARY
Glucose-Capillary: 157 mg/dL — ABNORMAL HIGH (ref 70–99)
Glucose-Capillary: 179 mg/dL — ABNORMAL HIGH (ref 70–99)
Glucose-Capillary: 187 mg/dL — ABNORMAL HIGH (ref 70–99)
Glucose-Capillary: 197 mg/dL — ABNORMAL HIGH (ref 70–99)
Glucose-Capillary: 243 mg/dL — ABNORMAL HIGH (ref 70–99)
Glucose-Capillary: 246 mg/dL — ABNORMAL HIGH (ref 70–99)

## 2022-03-12 LAB — BASIC METABOLIC PANEL
Anion gap: 18 — ABNORMAL HIGH (ref 5–15)
BUN: 73 mg/dL — ABNORMAL HIGH (ref 8–23)
CO2: 18 mmol/L — ABNORMAL LOW (ref 22–32)
Calcium: 5.9 mg/dL — CL (ref 8.9–10.3)
Chloride: 91 mmol/L — ABNORMAL LOW (ref 98–111)
Creatinine, Ser: 6.55 mg/dL — ABNORMAL HIGH (ref 0.61–1.24)
GFR, Estimated: 8 mL/min — ABNORMAL LOW (ref >60)
Glucose, Bld: 262 mg/dL — ABNORMAL HIGH (ref 70–99)
Potassium: 3.9 mmol/L (ref 3.5–5.1)
Sodium: 127 mmol/L — ABNORMAL LOW (ref 135–145)

## 2022-03-12 LAB — RENAL FUNCTION PANEL
Albumin: <1.5 g/dL — ABNORMAL LOW (ref 3.5–5.0)
Anion gap: 16 — ABNORMAL HIGH (ref 5–15)
BUN: 66 mg/dL — ABNORMAL HIGH (ref 8–23)
CO2: 20 mmol/L — ABNORMAL LOW (ref 22–32)
Calcium: 5.6 mg/dL — CL (ref 8.9–10.3)
Chloride: 94 mmol/L — ABNORMAL LOW (ref 98–111)
Creatinine, Ser: 5.36 mg/dL — ABNORMAL HIGH (ref 0.61–1.24)
GFR, Estimated: 11 mL/min — ABNORMAL LOW (ref >60)
Glucose, Bld: 193 mg/dL — ABNORMAL HIGH (ref 70–99)
Phosphorus: 5.4 mg/dL — ABNORMAL HIGH (ref 2.5–4.6)
Potassium: 3.8 mmol/L (ref 3.5–5.1)
Sodium: 130 mmol/L — ABNORMAL LOW (ref 135–145)

## 2022-03-12 LAB — LACTIC ACID, PLASMA
Lactic Acid, Venous: 4.8 mmol/L (ref 0.5–1.9)
Lactic Acid, Venous: 4.4 mmol/L (ref 0.5–1.9)
Lactic Acid, Venous: 4 mmol/L (ref 0.5–1.9)
Lactic Acid, Venous: 3.7 mmol/L (ref 0.5–1.9)
Lactic Acid, Venous: 4 mmol/L (ref 0.5–1.9)

## 2022-03-12 LAB — CBC
HCT: 35.4 % — ABNORMAL LOW (ref 39.0–52.0)
Hemoglobin: 12.7 g/dL — ABNORMAL LOW (ref 13.0–17.0)
MCH: 34.7 pg — ABNORMAL HIGH (ref 26.0–34.0)
MCHC: 35.9 g/dL (ref 30.0–36.0)
MCV: 96.7 fL (ref 80.0–100.0)
Platelets: 23 10*3/uL — CL (ref 150–400)
RBC: 3.66 MIL/uL — ABNORMAL LOW (ref 4.22–5.81)
RDW: 14.2 % (ref 11.5–15.5)
WBC: 29.4 10*3/uL — ABNORMAL HIGH (ref 4.0–10.5)
nRBC: 0.1 % (ref 0.0–0.2)

## 2022-03-12 LAB — TECHNOLOGIST SMEAR REVIEW
Plt Morphology: NORMAL
WBC MORPHOLOGY: INCREASED

## 2022-03-12 LAB — CALCIUM, IONIZED: Calcium, Ionized, Serum: 3.4 mg/dL — ABNORMAL LOW (ref 4.5–5.6)

## 2022-03-12 LAB — FIBRINOGEN: Fibrinogen: 475 mg/dL (ref 210–475)

## 2022-03-12 LAB — D-DIMER, QUANTITATIVE: D-Dimer, Quant: >20 ug/mL-FEU — ABNORMAL HIGH (ref 0.00–0.50)

## 2022-03-12 LAB — LIPASE, BLOOD: Lipase: 167 U/L — ABNORMAL HIGH (ref 11–51)

## 2022-03-12 MED ORDER — HEPARIN SODIUM (PORCINE) 1000 UNIT/ML DIALYSIS: 1000.0000 [IU] | INTRAMUSCULAR | Status: DC | PRN

## 2022-03-12 MED ORDER — CALCIUM GLUCONATE-NACL 1-0.675 GM/50ML-% IV SOLN
1.0000 g | Freq: Once | INTRAVENOUS | Status: AC
  Administered 2022-03-12: 1000 mg via INTRAVENOUS
  Filled 2022-03-12: qty 50

## 2022-03-12 MED ORDER — PRISMASOL BGK 4/2.5 32-4-2.5 MEQ/L REPLACEMENT SOLN
Status: DC
  Filled 2022-03-12 (×6): qty 5000

## 2022-03-12 MED ORDER — SODIUM CHLORIDE 0.9% IV SOLUTION: Freq: Once | INTRAVENOUS | Status: DC

## 2022-03-12 MED ORDER — SODIUM CHLORIDE 0.9 % IV SOLN
2.0000 g | Freq: Two times a day (BID) | INTRAVENOUS | Status: DC
  Administered 2022-03-12 – 2022-03-13 (×3): 2 g via INTRAVENOUS
  Filled 2022-03-12 (×3): qty 12.5

## 2022-03-12 MED ORDER — SODIUM CHLORIDE 0.9 % FOR CRRT: INTRAVENOUS_CENTRAL | Status: DC | PRN

## 2022-03-12 MED ORDER — PRISMASOL BGK 4/2.5 32-4-2.5 MEQ/L REPLACEMENT SOLN
Status: DC
  Filled 2022-03-12 (×4): qty 5000

## 2022-03-12 MED ORDER — PRISMASOL BGK 4/2.5 32-4-2.5 MEQ/L EC SOLN
Status: DC
  Filled 2022-03-12 (×18): qty 5000

## 2022-03-12 NOTE — Progress Notes (Signed)
PHARMACY NOTE:  ANTIMICROBIAL RENAL DOSAGE ADJUSTMENT  Current antimicrobial regimen includes a mismatch between antimicrobial dosage and estimated renal function.  As per policy approved by the Pharmacy & Therapeutics and Medical Executive Committees, the antimicrobial dosage will be adjusted accordingly.  Current antimicrobial dosage:  Cefepime 2g Q24H, flagyl 500 Q12H   Indication: Sepsis   Renal Function:  Estimated Creatinine Clearance: 13.7 mL/min (A) (by C-G formula based on SCr of 6.55 mg/dL (H)). '[]'$      On intermittent HD, scheduled: '[x]'$      On CRRT    Antimicrobial dosage has been changed to:  Cefepime 2g Q12H.   Thank you for allowing pharmacy to be a part of this patient's care.  Ventura Sellers, [facility] 03/12/2022 7:53 AM

## 2022-03-12 NOTE — Progress Notes (Signed)
Subjective: Patient intubated, still critically ill.  OGT just placed with 1L of dark brown output.  Wife at bedside.  Still on 2 pressors  Objective: Vital signs in last 24 hours: Temp:  [99.8 F (37.7 C)-103.2 F (39.6 C)] 100.1 F (37.8 C) (09/21 0800) Pulse Rate:  [64-79] 73 (09/21 0830) Resp:  [0-29] 0 (09/21 0430) BP: (88-111)/(23-80) 107/80 (09/20 1800) SpO2:  [89 %-98 %] 94 % (09/21 0830) Arterial Line BP: (70-154)/(50-78) 94/58 (09/21 0830) FiO2 (%):  [40 %] 40 % (09/21 0800) Weight:  [053.9 kg] 118.4 kg (09/21 0449) Last BM Date : 03/09/22  Intake/Output from previous day: 09/20 0701 - 09/21 0700 In: 9516 [I.V.:7199.8; IV Piggyback:2306.2] Out: 215 [Urine:80; Drains:135] Intake/Output this shift: Total I/O In: 970 [I.V.:796.9; Other:5; IV Piggyback:168.1] Out: 767 [Emesis/NG output:850; Drains:20]  PE: Gen: critically ill on vent Lungs: vented Abd: soft, perc chole drain in place with bilious output.  OGT in place with 1L of brown bilious output immediately Ext: mottled in BLE up to mid thighs as well as scrotum and penis.  Unable to palpate pedal pulses.  Left hand with some mottling as well.    Lab Results:  Recent Labs    03/11/22 1607 03/11/22 1811 03/12/22 0438 03/12/22 0746  WBC 24.3*  --  29.6*  29.4*  --   HGB 12.0*   < > 12.6*  12.7* 12.2*  HCT 35.4*   < > 36.5*  35.4* 36.0*  PLT 24*  --  25*  23*  --    < > = values in this interval not displayed.   BMET Recent Labs    03/11/22 1600 03/11/22 1811 03/12/22 0438 03/12/22 0746  NA 130*   < > 127* 124*  K 5.0   < > 3.9 3.7  CL 93*  --  91*  --   CO2 16*  --  18*  --   GLUCOSE 241*  --  262*  --   BUN 68*  --  73*  --   CREATININE 5.89*  --  6.55*  --   CALCIUM 6.5*  --  5.9*  --    < > = values in this interval not displayed.   PT/INR Recent Labs    03/21/2022 1454 03/11/22 0336  LABPROT 20.0* 21.9*  INR 1.7* 1.9*   CMP     Component Value Date/Time   NA 124 (L)  03/12/2022 0746   K 3.7 03/12/2022 0746   CL 91 (L) 03/12/2022 0438   CO2 18 (L) 03/12/2022 0438   GLUCOSE 262 (H) 03/12/2022 0438   BUN 73 (H) 03/12/2022 0438   CREATININE 6.55 (H) 03/12/2022 0438   CREATININE 1.18 01/29/2022 1504   CALCIUM 5.9 (LL) 03/12/2022 0438   PROT 4.6 (L) 03/11/2022 0336   ALBUMIN 1.8 (L) 03/11/2022 0336   AST 198 (H) 03/11/2022 0336   ALT 74 (H) 03/11/2022 0336   ALKPHOS 198 (H) 03/11/2022 0336   BILITOT 3.8 (H) 03/11/2022 0336   GFRNONAA 8 (L) 03/12/2022 0438   GFRNONAA 80 09/12/2020 1247   GFRAA 92 09/12/2020 1247   Lipase     Component Value Date/Time   LIPASE 26 03/11/2022 0336       Studies/Results: DG CHEST PORT 1 VIEW  Result Date: 03/11/2022 CLINICAL DATA:  Encounter for central line. EXAM: PORTABLE CHEST 1 VIEW COMPARISON:  Chest x-ray from the same day. FINDINGS: Right IJ approach central venous catheter with tip projecting at the  lower SVC. No visible pneumothorax. Endotracheal tube tip at the level of the clavicular heads. Gastric tube courses below the diaphragm. Mild interstitial prominence. Mild bibasilar opacities IMPRESSION: 1. Right IJ approach central venous catheter with tip projecting at the lower SVC. No visible pneumothorax. 2. Mild interstitial opacities, which could represent interstitial pulmonary edema versus the sequela of recurrent bouts of CHF. 3. Mild bibasilar opacities, which could represent atelectasis and/or consolidation. Electronically Signed   By: Margaretha Sheffield M.D.   On: 03/11/2022 15:07   ECHOCARDIOGRAM COMPLETE  Result Date: 03/11/2022    ECHOCARDIOGRAM REPORT   Patient Name:   KERON NEENAN Date of Exam: 03/11/2022 Medical Rec #:  [id number]        Height:       72.0 in Accession #:    [id number]       Weight:       210.0 lb Date of Birth:  17-Sep-1950       BSA:          2.175 m Patient Age:    71 years         BP:           84/65 mmHg Patient Gender: M                HR:           75 bpm. Exam Location:   Inpatient Procedure: 2D Echo, Intracardiac Opacification Agent, Cardiac Doppler and Color            Doppler Indications:    Elevated troponin  History:        Patient has no prior history of Echocardiogram examinations.                 COPD; Risk Factors:Hypertension and Dyslipidemia. Aortic                 atherosclerosis, emphysema.  Sonographer:    Eartha Inch Referring Phys: [id number] VINEET SOOD  Sonographer Comments: Technically difficult study due to poor echo windows and echo performed with patient supine and on artificial respirator. Image acquisition challenging due to patient body habitus, Image acquisition challenging due to respiratory motion and Image acquisition challenging due to COPD. EF 35-40%. IMPRESSIONS  1. Left ventricular ejection fraction, by estimation, is 65 to 70%. The left ventricle has normal function. The left ventricle has no regional wall motion abnormalities. Left ventricular diastolic parameters were normal.  2. Right ventricular systolic function is normal. The right ventricular size is normal. Tricuspid regurgitation signal is inadequate for assessing PA pressure.  3. The mitral valve was not well visualized. Trivial mitral valve regurgitation. No evidence of mitral stenosis.  4. The aortic valve was not well visualized. Aortic valve regurgitation is not visualized. No aortic stenosis is present.  5. The inferior vena cava is normal in size with greater than 50% respiratory variability, suggesting right atrial pressure of 3 mmHg. Comparison(s): No prior Echocardiogram. Conclusion(s)/Recommendation(s): Very limited echo windows. Grossly normal LVEF, with underfilled appearing ventricle. Normal RV size/function. IVC collapses. Valves not well visualized, but no significant stenosis/regurgitation by Doppler. FINDINGS  Left Ventricle: Left ventricular ejection fraction, by estimation, is 65 to 70%. The left ventricle has normal function. The left ventricle has no regional wall motion  abnormalities. Definity contrast agent was given IV to delineate the left ventricular  endocardial borders. The left ventricular internal cavity size was small. Suboptimal image quality limits for assessment of left ventricular hypertrophy. Left ventricular diastolic parameters were  normal. Right Ventricle: The right ventricular size is normal. Right vetricular wall thickness was not well visualized. Right ventricular systolic function is normal. Tricuspid regurgitation signal is inadequate for assessing PA pressure. Left Atrium: Left atrial size was not well visualized. Right Atrium: Right atrial size was not well visualized. Pericardium: There is no evidence of pericardial effusion. Mitral Valve: The mitral valve was not well visualized. Trivial mitral valve regurgitation. No evidence of mitral valve stenosis. Tricuspid Valve: The tricuspid valve is not well visualized. Tricuspid valve regurgitation is trivial. No evidence of tricuspid stenosis. Aortic Valve: The aortic valve was not well visualized. Aortic valve regurgitation is not visualized. No aortic stenosis is present. Pulmonic Valve: The pulmonic valve was not well visualized. Pulmonic valve regurgitation is not visualized. Aorta: The ascending aorta was not well visualized, the aortic arch was not well visualized and the aortic root was not well visualized. Venous: The inferior vena cava is normal in size with greater than 50% respiratory variability, suggesting right atrial pressure of 3 mmHg. IAS/Shunts: The interatrial septum was not well visualized.  LEFT VENTRICLE PLAX 2D LVIDd:         3.97 cm   Diastology LVIDs:         2.52 cm   LV e' medial:    8.92 cm/s LV PW:         1.20 cm   LV E/e' medial:  8.8 LV IVS:        0.80 cm   LV e' lateral:   13.90 cm/s LVOT diam:     2.30 cm   LV E/e' lateral: 5.7 LV SV:         91 LV SV Index:   42 LVOT Area:     4.15 cm  RIGHT VENTRICLE             IVC RV S prime:     12.80 cm/s  IVC diam: 2.00 cm TAPSE  (M-mode): 2.0 cm LEFT ATRIUM           Index        RIGHT ATRIUM           Index LA diam:      3.50 cm 1.61 cm/m   RA Area:     12.60 cm LA Vol (A4C): 56.4 ml 25.93 ml/m  RA Volume:   30.40 ml  13.98 ml/m  AORTIC VALVE LVOT Vmax:   137.00 cm/s LVOT Vmean:  95.700 cm/s LVOT VTI:    0.219 m  AORTA Ao Root diam: 3.40 cm Ao Asc diam:  3.40 cm MITRAL VALVE MV Area (PHT): 3.48 cm    SHUNTS MV Decel Time: 218 msec    Systemic VTI:  0.22 m MR Peak grad: 5.3 mmHg     Systemic Diam: 2.30 cm MR Vmax:      115.00 cm/s MV E velocity: 78.80 cm/s MV A velocity: 32.30 cm/s MV E/A ratio:  2.44 Buford Dresser MD Electronically signed by Buford Dresser MD Signature Date/Time: 03/11/2022/12:21:40 PM    Final    DG CHEST PORT 1 VIEW  Result Date: 03/11/2022 CLINICAL DATA:  Hypoxia EXAM: PORTABLE CHEST 1 VIEW COMPARISON:  02/25/2022 FINDINGS: Cardiac shadow is stable. Endotracheal tube, gastric catheter and left jugular central line are noted in satisfactory position. No focal infiltrate or effusion is seen. No bony abnormality is noted. IMPRESSION: No active disease. Electronically Signed   By: Inez Catalina M.D.   On: 03/11/2022 03:47   DG Abd 1 View  Result Date: 03/11/2022 CLINICAL DATA:  Hypoxia and abdominal pain EXAM: ABDOMEN - 1 VIEW COMPARISON:  None Available. FINDINGS: Cholecystostomy tube is noted in satisfactory position. No renal or ureteral calculi seen. Gastric catheter extends into the stomach. No bony abnormality is seen. IMPRESSION: No acute abnormality noted. Electronically Signed   By: Inez Catalina M.D.   On: 03/11/2022 03:47   DG CHEST PORT 1 VIEW  Result Date: 03/09/2022 CLINICAL DATA:  Septic shock, intubated EXAM: PORTABLE CHEST 1 VIEW COMPARISON:  03/17/2022 FINDINGS: Single frontal view of the chest demonstrates endotracheal tube overlying tracheal air column tip midway between thoracic inlet and carina. Enteric catheter passes below diaphragm tip excluded by collimation. Left  internal jugular catheter tip overlies superior vena cava. External defibrillator pads overlie the left chest. Cardiac silhouette is unremarkable. There is increased central vascular congestion, with developing bilateral perihilar ground-glass airspace disease. No effusion or pneumothorax. No acute bony abnormalities. IMPRESSION: 1. Support devices as above. 2. Increased vascular congestion with progressive bilateral perihilar ground-glass airspace disease, most consistent with fluid overload and mild edema. Electronically Signed   By: Randa Ngo M.D.   On: 03/06/2022 22:29   IR Perc Cholecystostomy  Result Date: 03/05/2022 INDICATION: Acute cholecystitis and sepsis. Percutaneous cholecystostomy E required as the patient is not a candidate for cholecystectomy currently. EXAM: PERCUTANEOUS CHOLECYSTOSTOMY TUBE PLACEMENT MEDICATIONS: None ANESTHESIA/SEDATION: Formal conscious sedation was not performed due to critical illness and poor airway. The patient was given 1.0 mg IV Versed during the procedure. FLUOROSCOPY: Radiation Exposure Index (as provided by the fluoroscopic device): 1.0 mGy Kerma COMPLICATIONS: None immediate. PROCEDURE: Informed written consent was obtained from the patient's wife after a thorough discussion of the procedural risks, benefits and alternatives. All questions were addressed. Maximal Sterile Barrier Technique was utilized including caps, mask, sterile gowns, sterile gloves, sterile drape, hand hygiene and skin antiseptic. A timeout was performed prior to the initiation of the procedure. Ultrasound was used to localize the gallbladder. Local anesthesia was provided with 1% lidocaine. An 18 gauge trocar needle was advanced into the gallbladder lumen under ultrasound guidance. After confirming needle tip position and with return of bile, a guidewire was advanced into the gallbladder lumen and the needle removed. The tract was dilated to 10 [place]. A 10 French percutaneous drain was  advanced into the gallbladder. A sample of bile was then withdrawn through the drain and sent for culture analysis. Contrast was injected via the cholecystostomy tube and a fluoroscopic spot image obtained. The catheter was then flushed with sterile saline and attached to a gravity drainage bag. It was secured at the skin with a Prolene retention suture and StatLock device. FINDINGS: Ultrasound demonstrates inflamed gallbladder with gallbladder wall thickening as well as edematous wall and edema surrounding the gallbladder fossa within the liver. A discrete hepatic abscess is not identified with small pockets of fluid and edema adjacent to the gallbladder within the liver parenchyma. After placement of the cholecystostomy tube there is return of cloudy purulent appearing bile. A sample was sent for culture analysis. IMPRESSION: Placement of percutaneous cholecystostomy tube into the gallbladder. A sample of purulent bile was sent for culture analysis. Initial ultrasound demonstrates adjacent areas of fluid and edema within the liver adjacent to the gallbladder fossa. A discrete unilocular abscess was not identified. Electronically Signed   By: Aletta Edouard M.D.   On: 03/11/2022 16:35   US Abdomen Limited RUQ (LIVER/GB)  Result Date: 03/08/2022 CLINICAL DATA:  Right upper quadrant pain. EXAM: ULTRASOUND ABDOMEN  LIMITED RIGHT UPPER QUADRANT COMPARISON:  CT abdomen and pelvis 02/21/2022 FINDINGS: Gallbladder: The gallbladder wall is again thickened, measuring up to 7 mm. The sonographer reports a positive sonographic Murphy's sign" with patient pain when the probe is pressed on the gallbladder. There is again mild pericholecystic fluid. No gallstone is seen. Common bile duct: Diameter: 5 mm, within normal limits. Liver: There is a hypoechoic region with lobular margins just anterior and superior to the gallbladder corresponding to the dense fluid region on today's CT. This measures up to approximately 4.5 x 3.0  x 4.2 cm. There is heterogeneous hepatic echotexture. Portal vein is patent on color Doppler imaging with normal direction of blood flow towards the liver. Other: None. IMPRESSION: 1. There is again gallbladder wall thickening and pericholecystic fluid. Positive sonographic Murphy's sign. Findings suggest acalculous cholecystitis. Note is made that there was primary or secondary inflammatory change within the adjacent hepatic flexure of the colon on today's CT. It is unclear whether the primary cause of the inflammation is the gallbladder, colon, or both. 2. Mildly complex fluid collection adjacent to the gallbladder fossa as seen on CT. Differential considerations again include a possible complex cyst or abscess. Electronically Signed   By: Yvonne Kendall M.D.   On: 03/16/2022 15:19   CT ABDOMEN PELVIS W CONTRAST  Result Date: 03/21/2022 CLINICAL DATA:  Abdominal pain, acute, nonlocalized EXAM: CT ABDOMEN AND PELVIS WITH CONTRAST TECHNIQUE: Multidetector CT imaging of the abdomen and pelvis was performed using the standard protocol following bolus administration of intravenous contrast. RADIATION DOSE REDUCTION: This exam was performed according to the departmental dose-optimization program which includes automated exposure control, adjustment of the mA and/or kV according to patient size and/or use of iterative reconstruction technique. CONTRAST:  33m OMNIPAQUE IOHEXOL 350 MG/ML SOLN COMPARISON:  None Available. FINDINGS: Lower chest: Bibasilar subsegmental atelectasis. Heart size within normal limits. Gynecomastia is evident on the right. Hepatobiliary: Abnormal appearance of the gallbladder which is moderately dilated with wall thickening, pericholecystic fluid, and pericholecystic fat stranding. No hyperdense gallstone. There is an irregular hypoattenuating lesion within the liver adjacent to the gallbladder fossa measuring approximately 4.4 x 2.5 x 3.6 cm with internal density slightly greater than fluid.  Liver appears otherwise unremarkable. No intrahepatic biliary dilatation. Pancreas: Unremarkable. No pancreatic ductal dilatation or surrounding inflammatory changes. Spleen: Normal in size without focal abnormality. Adrenals/Urinary Tract: Unremarkable adrenal glands. Kidneys enhance symmetrically without solid lesion, stone, or hydronephrosis. Ureters are nondilated. Urinary bladder is decompressed by Foley catheter. Stomach/Bowel: Long segment wall thickening of the colon centered at the hepatic flexure, which may be reactive secondary to local inflammation associated with the gallbladder. Sigmoid diverticulosis. Normal appendix in the right lower quadrant. No dilated loops of bowel. Small hiatal hernia. Vascular/Lymphatic: Scattered aortoiliac atherosclerotic calcifications without aneurysm. No abdominopelvic lymphadenopathy. Reproductive: Prostate is unremarkable. Other: No ascites. No abdominopelvic fluid collection. No pneumoperitoneum. No abdominal wall hernia. Musculoskeletal: No acute or significant osseous findings. IMPRESSION: 1. Appearance of the gallbladder is highly suspicious for acute cholecystitis. Consider further evaluation with right upper quadrant ultrasound. 2. Irregular low-density lesion within the liver adjacent to the gallbladder fossa measuring up to 4.4 cm. This could represent a hepatic abscess, complex cyst, or neoplasm. 3. Long segment wall thickening of the colon centered at the hepatic flexure, which may be reactive secondary to local inflammation associated with the gallbladder versus a nonspecific colitis. 4. Sigmoid diverticulosis without evidence of acute diverticulitis. 5. Aortic Atherosclerosis (ICD10-I70.0). Electronically Signed   By: NDavina Poke  D.O.   On: 03/02/2022 14:02   CT Angio Chest PE W and/or Wo Contrast  Result Date: 03/06/2022 CLINICAL DATA:  Chest pain. EXAM: CT ANGIOGRAPHY CHEST WITH CONTRAST TECHNIQUE: Multidetector CT imaging of the chest was  performed using the standard protocol during bolus administration of intravenous contrast. Multiplanar CT image reconstructions and MIPs were obtained to evaluate the vascular anatomy. RADIATION DOSE REDUCTION: This exam was performed according to the departmental dose-optimization program which includes automated exposure control, adjustment of the mA and/or kV according to patient size and/or use of iterative reconstruction technique. CONTRAST:  36m OMNIPAQUE IOHEXOL 350 MG/ML SOLN COMPARISON:  June 28, 2018. FINDINGS: Cardiovascular: Satisfactory opacification of the pulmonary arteries to the segmental level. No evidence of pulmonary embolism. Normal heart size. No pericardial effusion. Mediastinum/Nodes: No enlarged mediastinal, hilar, or axillary lymph nodes. Thyroid gland, trachea, and esophagus demonstrate no significant findings. Lungs/Pleura: Lungs are clear. No pleural effusion or pneumothorax. Upper Abdomen: No acute abnormality. Musculoskeletal: No chest wall abnormality. No acute or significant osseous findings. Review of the MIP images confirms the above findings. IMPRESSION: No definite evidence of pulmonary embolus. No acute abnormality seen in the chest. Electronically Signed   By: JMarijo ConceptionM.D.   On: 03/14/2022 13:58   CT Head Wo Contrast  Result Date: 02/23/2022 CLINICAL DATA:  Head trauma. EXAM: CT HEAD WITHOUT CONTRAST TECHNIQUE: Contiguous axial images were obtained from the base of the skull through the vertex without intravenous contrast. RADIATION DOSE REDUCTION: This exam was performed according to the departmental dose-optimization program which includes automated exposure control, adjustment of the mA and/or kV according to patient size and/or use of iterative reconstruction technique. COMPARISON:  None Available. FINDINGS: Brain: No evidence of acute infarction, hemorrhage, hydrocephalus, extra-axial collection or mass lesion/mass effect. There are sequela of moderate  chronic microvascular ischemic change with advanced generalized volume loss. Vascular: No hyperdense vessel or unexpected calcification. Skull: Normal. Negative for fracture or focal lesion. Sinuses/Orbits: Bilateral lens replacement. Mild mucosal thickening bilateral maxillary and ethmoid sinuses, as well as right sphenoid sinus. Other: None. IMPRESSION: No CT evidence of acute intracranial injury. Electronically Signed   By: HMarin RobertsM.D.   On: 02/22/2022 13:51   DG Chest Port 1 View  Result Date: 03/08/2022 CLINICAL DATA:  Provided history: Questionable sepsis-evaluate for abnormality. EXAM: PORTABLE CHEST 1 VIEW COMPARISON:  Prior chest radiographs 09/12/2020 and earlier. FINDINGS: Heart size within normal limits. Aortic atherosclerosis. Minimal atelectasis within the right lung base. No appreciable airspace consolidation or pulmonary edema. No evidence of pleural effusion or pneumothorax. No acute bony abnormality identified. IMPRESSION: Minimal atelectasis within the right lung base. Otherwise, no evidence of acute cardiopulmonary abnormality. Aortic Atherosclerosis (ICD10-I70.0). Electronically Signed   By: KKellie SimmeringD.O.   On: 03/09/2022 12:23    Anti-infectives: Anti-infectives (From admission, onward)    Start     Dose/Rate Route Frequency Ordered Stop   03/12/22 1000  ceFEPIme (MAXIPIME) 2 g in sodium chloride 0.9 % 100 mL IVPB        2 g 200 mL/hr over 30 Minutes Intravenous Every 12 hours 03/12/22 0752     03/11/22 1100  ceFEPIme (MAXIPIME) 2 g in sodium chloride 0.9 % 100 mL IVPB  Status:  Discontinued        2 g 200 mL/hr over 30 Minutes Intravenous Every 24 hours 03/04/2022 1327 03/12/22 0752   03/04/2022 2000  metroNIDAZOLE (FLAGYL) IVPB 500 mg        500 mg 100  mL/hr over 60 Minutes Intravenous Every 12 hours 03/01/2022 1543     03/15/2022 1322  vancomycin variable dose per unstable renal function (pharmacist dosing)  Status:  Discontinued         Does not apply See admin  instructions 03/19/2022 1327 03/11/22 1658   03/07/2022 1200  vancomycin (VANCOREADY) IVPB 2000 mg/400 mL        2,000 mg 200 mL/hr over 120 Minutes Intravenous  Once 03/18/2022 1130 03/21/2022 1412   03/16/2022 1115  ceFEPIme (MAXIPIME) 2 g in sodium chloride 0.9 % 100 mL IVPB        2 g 200 mL/hr over 30 Minutes Intravenous  Once 03/09/2022 1108 02/20/2022 1200   03/11/2022 1115  metroNIDAZOLE (FLAGYL) IVPB 500 mg        500 mg 100 mL/hr over 60 Minutes Intravenous  Once 03/08/2022 1108 03/07/2022 1315   03/12/2022 1115  vancomycin (VANCOCIN) IVPB 1000 mg/200 mL premix  Status:  Discontinued        1,000 mg 200 mL/hr over 60 Minutes Intravenous  Once 02/20/2022 1108 03/18/2022 1129        Assessment/Plan Severe sepsis secondary to Acute cholecystitis with likely Liver abscess, ileus -perc chole placed by IR 9/19, appreciate their assistance. -persistent sepsis  -cont abx therapy -LFTs not checked today, follow up tomorrow -WBC up to 29K today, t max 103 overnight - given ileus would not recommend initiation of enteral feeds and while he is on such high pressor requirements.  Risk of ischemic bowel increased.  May have to consider TNA for now. -discussed surgical issues with wife at bedside and primary team on the unit    FEN: NPO/IVFs/pressor ID: [email or web address] VTE: none given low platelets   A fib RVR - s/p cardioversion.  on amio, per primary ARF - essentially oliguric - foley in place, rising Cr, to start CRRT today ? DIC - defer to primary Supra-therapeutic INR - secondary to sepsis Thrombocytopenia - secondary to sepsis HLD Hypokalemia - resolved Elevated LFTs - secondary to above, monitor VDRF - per primary  I reviewed nursing notes, Consultant nephrology notes, hospitalist notes, last 24 h vitals and pain scores, last 48 h intake and output, last 24 h labs and trends, and last 24 h imaging results.   LOS: 2 days    Henreitta Cea , [facility] Surgery 03/12/2022, 10:12  AM Please see Amion for pager number during day hours 7:00am-4:30pm or 7:00am -11:30am on weekends

## 2022-03-12 NOTE — Progress Notes (Signed)
NAME:  Eric [name], MRN:  [id number], DOB:  1951/05/06, LOS: 2 ADMISSION DATE:  02/20/2022,  Attending  MD:  Freda Jackson, CHIEF COMPLAINT:  Septic shock secondary to cholecystitis s/p VIR Cholecystostomy   History of Present Illness:  71 year old male with PMH of HLD, labile HTN, remote smoking history who presented from home with abdominal pain.  Patient reports he had sudden onset of abdominal pain with nausea and vomiting on 9/14 in which he attributed to food poisoning.  Has only been able to tolerate liquids, saltine crackers, and some protein shakes over the weekend.  Tried eating soup yesterday and vomited that up with one episode of diarrhea and had chills.  Denies fevers, SOB, or chest pain.  Has abrasion to left knee, patient does not remember how he obtained that.  Reports not voiding as normal.  Given worsening abdominal pain, he presented to ER.    Arrived in ER in Afib with RVR and normotensive initially.  Was placed on esmolol for rate control then became hypotensive requiring emergent cardioversion back to ST.  Ongoing hypotension despite 3L IVF, therefore started on norepinephrine.    Workup in ER noted for K 2.8, sCr 4.86 (previously 1.18 in 01/2022), bicarb 14, iCa 1.07, alk phos 520, AST/ ALT 95/45, t. Bili 3.3, trop hs 821, WBC 6.9, Hgb 14.3, plts 30, lactic acid > 9 with repeat 8.9.  CTA PE was neg for PE with no acute abnormalities in the chest, CTH neg, and CT a/p w/contrast concerning for acute cholecystitis and liver lesion adjacent to the gallbladder which could represent hepatic abscess, complex cyst, or neoplasm.  RUQ US showed gallbladder thickening and pericholecystic fluid, positive Murphy's sign suggestive of acalculous cholecystitis, mildly complex fluid collection adjacent to gallbladder seen again.  Cultures sent and started on empiric abx.  Treated with calcium, bicarb push then drip, and KCL.  Surgery and IR were consulted and IR performed a percutaneous  cholecystostomy with drain in place.   Pertinent  Medical History  HLD Labile HTN Emphysema of the lung Hx of Basal Cell Cancer Remote but significant smoking Hx Depression  Significant Hospital Events: Including procedures, antibiotic start and stop dates in addition to other pertinent events   9/19: Arterial line placed, IR percutaneous cholecystostomy, CVC catheter placement left IJV, intubation for respiratory failure 9/20 Right IJV HD catheter placed  Interim History / Subjective:  Overnight patient was found to be hypocalcemic, with repletion of 2g Calcium gluconate ordered by [place] provider; otherwise no acute events. Nursing weaned Levo to 28 mcg/min with positive response, Vaso was consistently at 0.04 u/min. EPI was not needed. Nursing notes that distal pedal pulses were appreciated via doppler bilaterally. Of note, family was curious why CRRT was not initiated last night.   Objective   Blood pressure 107/80, pulse 73, temperature (!) 102.6 F (39.2 C), temperature source Oral, resp. rate (!) 0, height 6' (1.829 m), weight 118.4 kg, SpO2 94 %.    Vent Mode: PSV;CPAP FiO2 (%):  [40 %] 40 % Set Rate:  [24 bmp] 24 bmp Vt Set:  [620 mL] 620 mL PEEP:  [8 cmH20] 8 cmH20 Pressure Support:  [5 cmH20] 5 cmH20 Plateau Pressure:  [20 cmH20-25 cmH20] 20 cmH20   Intake/Output Summary (Last 24 hours) at 03/12/2022 0844 Last data filed at 03/12/2022 0800 Gross per 24 hour  Intake 9272.24 ml  Output 215 ml  Net 9057.24 ml    Filed Weights   03/05/2022 1115 03/12/22 0449  Weight: 95.3 kg 118.4 kg    Examination: General: Critically ill appearing, intubated and sedated and lying in bed HENT: perioral/Auricular cyanosis, CVC present in left IJV. HD catheter present in right IJV.  Lungs: Clear to ausculation in lung fields, breathing over ventilator rate Cardiovascular: Difficult exam, bedside echo demonstrated left ventricle global hypokinesis Abdomen: Percutaneous Drain present in  RUQ, draining dark yellow purulent drainage, drain is patent. Firm abdomen (worsened from yesterday), non tympanic.  Extremities: Severely mottled bilateral lower extremities, With dorsalis pedis pulses auscultated bilaterally (doppler). Mottling is present to mid thigh and includes penis.  Neuro: Sedated GU: Foley in place  Resolved Hospital Problem list     Assessment & Plan:  Septic shock  2/2  acute acalculous cholecystitis s/p IR perc. Cholecystostomy Patient is critically ill. Current pressor regimen of Levophed and Vasopressin, with stable MAPs. As of today, patient has improved perfusion, with auscultation of distal pulses via doppler and visibly improved mottling. Lactate at 4.4 from 6.4 most recent ABG with pH of 7.45. Currently on Day 3 of Cefepime and Flagyl, Vanc was dc'ed 2/20.  Perc Drain culture has grown E.coli, but abx susceptibility is still pending.  - Continue NE and Vaso, with MAP goal 65-75, titrate as possible - Consider EPI  for additional beta stimulation and BP support if hypotensive with with awareness of concomitant AFIB w/ RVR and potential exacerbation by EPI - Fluid replace as needed - follow cultures, susceptibility - Continue cefepime, and flagyl - Daily CBC - Checking Lipase - Trend lactate - Surgery following, appreciate recs   AKI  Hypokalemia  AGMA/ lactic acidosis AKI likely pre-renal from poor PO intake, hypotension. Of note, pt did get IV contrast. Patient's elevated liver enzymes also likely secondary to hypotension. Initial UA with mod Hgb, no RBC, 100 protein, neg leuk/ nitrates, follow UC. S/p 2 runs of KCL in ER. No hydronephrosis/ blockage noted on CT a/p.  As of today, trace edema on exam.  Acidosis improved as evident by morning Abg pH of 7.45. Will start CRRT given overloaded fluid status in the context of decreased pressor need and now stable MAPs.  - Start CRRT, goal net even today per Nephrology - Nephrology following, appreciate  continued recs - Will stop bicarb gtt per Nephro recs - strict I/Os - avoid further nephrotoxins  - Continue foley  Hypocalcemia Overnight patients calcium continued to decrease. 2g's of calcium gluconate were delivered for repletion. Likely secondary to AKI.  - Will correct with CRRT   Hypoxia respiratory failure  Patient was intubated given respiratory failure in the context of septic shock. Initial CXR and CTA PE neg for acute process. As of today, breathing with vent.  On fentanyl gtt 23mg/min and precedex for sedation. Current Vent settings:PRVC: 30% Fio2, 24 RR, Vt set: 450 mL, PEEP 5cmh2o,   - Trial PS/CPAP   New onset Afib with RVR Elevated hs troponin New onset Afib with no prior hx, s/p cardioversion in ER. Amiodarone started overnight. Elevated troponin likely secondary to demand in context of shock. Formal echo demonstrated normal LVEF with no wall motion abnormalities as well as normal RV systolic function.  - Continue Amiodarone 30 mg/hour - ideally goal K> 4, Mag> 2   Coagulapathy, elevated INR Thrombocytopenia Initial DIC panel wnl. As of today,  d-dimer still elevated and fibrinogen WNL. No schistocytes seen on initial smear, HgB at 12.7 from 11.6. Clinical picture not consistent with DIC, coagulopathy likely related to sepsis.  - Daily D-dimer, fibrinogen  tomorrow   HLD - hold crestor with transaminitis   Best Practice (right click and "Reselect all SmartList Selections" daily)   Diet/type: Will assess tomorrow DVT prophylaxis: SCD, GI prophylaxis: PPI Lines: Central line, Dialysis Catheter, Arterial Line, and yes and it is still needed Foley:  Yes, and it is still needed Code Status:  full code Last date of multidisciplinary goals of care discussion [9/19,9/20] Wife, Son-in-law at bedside, updated on plan of care  Labs   CBC: Recent Labs  Lab 02/27/2022 1104 03/18/2022 1128 02/27/2022 1454 03/06/2022 2121 03/11/22 0336 03/11/22 0532 03/11/22 1607  03/11/22 1811 03/12/22 0438 03/12/22 0746  WBC 6.9  --   --   --  25.5*  --  24.3*  --  29.4*  --   NEUTROABS 4.8  --   --   --   --   --   --   --   --   --   HGB 14.3   < >  --    < > 13.4 12.9* 12.0* 11.6* 12.7* 12.2*  HCT 42.8   < >  --    < > 38.5* 38.0* 35.4* 34.0* 35.4* 36.0*  MCV 102.1*  --   --   --  100.0  --  100.9*  --  96.7  --   PLT 30*  --  25*  --  28*  --  24*  --  23*  --    < > = values in this interval not displayed.     Basic Metabolic Panel: Recent Labs  Lab 02/24/2022 1104 03/17/2022 1128 03/04/2022 1236 03/16/2022 1620 03/03/2022 2121 03/11/22 0336 03/11/22 0532 03/11/22 1600 03/11/22 1811 03/12/22 0438 03/12/22 0746  NA 136 134*  --  133*   < > 133* 130* 130* 127* 127* 124*  K 2.8* 2.8*  --  3.0*   < > 4.7 4.9 5.0 4.8 3.9 3.7  CL 99 101  --  96*  --  101  --  93*  --  91*  --   CO2 14*  --   --  21*  --  12*  --  16*  --  18*  --   GLUCOSE 105* 96  --  121*  --  149*  --  241*  --  262*  --   BUN 61* 56*  --  63*  --  68*  --  68*  --  73*  --   CREATININE 4.86* 5.20*  --  4.95*  --  5.45*  --  5.89*  --  6.55*  --   CALCIUM 8.7*  --   --  8.4*  --  8.0*  --  6.5*  --  5.9*  --   MG  --   --  1.9  --   --  2.5*  --   --   --   --   --    < > = values in this interval not displayed.    GFR: Estimated Creatinine Clearance: 13.7 mL/min (A) (by C-G formula based on SCr of 6.55 mg/dL (H)). Recent Labs  Lab 03/18/2022 1104 03/17/2022 1300 03/11/22 0336 03/11/22 0739 03/11/22 1600 03/11/22 1607 03/11/22 2050 03/12/22 0005 03/12/22 0438 03/12/22 0447  WBC 6.9  --  25.5*  --   --  24.3*  --   --  29.4*  --   LATICACIDVEN >9.0*   < >  --    < > 8.4*  --  6.4* 4.8*  --  4.4*   < > = values in this interval not displayed.     Liver Function Tests: Recent Labs  Lab 03/03/2022 1104 03/11/22 0336  AST 95* 198*  ALT 45* 74*  ALKPHOS 520* 198*  BILITOT 3.3* 3.8*  PROT 5.0* 4.6*  ALBUMIN 2.2* 1.8*    Recent Labs  Lab 03/11/22 0336  LIPASE 26    No  results for input(s): "AMMONIA" in the last 168 hours.  ABG    Component Value Date/Time   PHART 7.453 (H) 03/12/2022 0746   PCO2ART 29.1 (L) 03/12/2022 0746   PO2ART 71 (L) 03/12/2022 0746   HCO3 20.4 03/12/2022 0746   TCO2 21 (L) 03/12/2022 0746   ACIDBASEDEF 3.0 (H) 03/12/2022 0746   O2SAT 95 03/12/2022 0746     Coagulation Profile: Recent Labs  Lab 03/09/2022 1104 03/03/2022 1454 03/11/22 0336  INR 1.9* 1.7* 1.9*     Cardiac Enzymes: No results for input(s): "CKTOTAL", "CKMB", "CKMBINDEX", "TROPONINI" in the last 168 hours.  HbA1C: Hgb A1c MFr Bld  Date/Time Value Ref Range Status  01/29/2022 03:04 PM 5.6 <5.7 % of total Hgb Final    Comment:    For the purpose of screening for the presence of diabetes: . <5.7%       Consistent with the absence of diabetes 5.7-6.4%    Consistent with increased risk for diabetes             (prediabetes) > or =6.5%  Consistent with diabetes . This assay result is consistent with a decreased risk of diabetes. . Currently, no consensus exists regarding use of hemoglobin A1c for diagnosis of diabetes in children. . According to American Diabetes Association (ADA) guidelines, hemoglobin A1c <7.0% represents optimal control in non-pregnant diabetic patients. Different metrics may apply to specific patient populations.  Standards of Medical Care in Diabetes(ADA). Marland Kitchen   04/30/2021 11:02 AM 5.3 4.8 - 5.6 % Final    Comment:    (NOTE) Pre diabetes:          5.7%-6.4%  Diabetes:              >6.4%  Glycemic control for   <7.0% adults with diabetes     CBG: Recent Labs  Lab 03/11/22 1601 03/11/22 1948 03/11/22 2310 03/12/22 0443 03/12/22 0744  GLUCAP 218* 247* 256* 246* 243*    Past Medical History:  He,  has a past medical history of Arteriosclerosis of aorta ([place]), Arthritis, Basal cell carcinoma, GERD (gastroesophageal reflux disease), Hyperlipidemia, Labile hypertension, OA (osteoarthritis) of knee (11/25/2020), Other  testicular hypofunction, Prediabetes, and Vitamin D deficiency.   Surgical History:   Past Surgical History:  Procedure Laterality Date   CATARACT EXTRACTION, BILATERAL Bilateral 2019   Dr. Carmell Austria   dental implant surgery      IR PERC CHOLECYSTOSTOMY  02/22/2022   KNEE ARTHROSCOPY Left 1988   SKIN CANCER EXCISION     Ear, forehead, nose   TOTAL KNEE ARTHROPLASTY Left 11/25/2020   Procedure: TOTAL KNEE ARTHROPLASTY;  Surgeon: Gaynelle Arabian, MD;  Location: [facility];  Service: Orthopedics;  Laterality: Left;  36mn   TOTAL KNEE ARTHROPLASTY Right 05/05/2021   Procedure: TOTAL KNEE ARTHROPLASTY;  Surgeon: AGaynelle Arabian MD;  Location: [facility];  Service: Orthopedics;  Laterality: Right;     Social History:   reports that he quit smoking about 17 years ago. His smoking use included cigarettes. He started smoking about 53 years ago. He has a 55.50 pack-year smoking history. He has never  used smokeless tobacco. He reports current alcohol use of about 2.0 - 3.0 standard drinks of alcohol per week. He reports that he does not use drugs.   Family History:  His family history includes Alzheimer's disease in his mother; Colon polyps in his father; Diabetes in his mother; Heart disease in his father; Hypertension in his father; Stroke in his father.   Allergies Allergies  Allergen Reactions   Lipitor [Atorvastatin] Other (See Comments)    Cloudy headed    Lopid [Gemfibrozil] Other (See Comments)    Unknown reaction. Patient doesn't remember taking this medication.   Penicillins Other (See Comments)    Unknown childhood reaction Tolerated Cephalosporin Date: 11/26/20. Tolerated Cephalosporin Date: 05/06/21.       Home Medications  Prior to Admission medications   Medication Sig Start Date End Date Taking? Authorizing Provider  calcium carbonate (TUMS - DOSED IN MG ELEMENTAL CALCIUM) 500 MG chewable tablet Chew 1,000 mg by mouth daily as needed for indigestion or heartburn.   Yes  [provider]  citalopram (CELEXA) 20 MG tablet Take 1 tablet (20 mg total) by mouth daily. 07/16/21 07/16/22 Yes Liane Comber, NP  Cyanocobalamin (B-12 PO) Take 1 capsule by mouth daily.   Yes [provider]  ibuprofen (ADVIL) 200 MG tablet Take 400 mg by mouth every 4 (four) hours as needed for moderate pain.   Yes [provider]  rosuvastatin (CRESTOR) 5 MG tablet TAKE 1 TABLET BY MOUTH DAILY FOR CHOLESTEROL AND PLAQUE ON ARTERIES Patient taking differently: Take 5 mg by mouth daily. 12/02/21  Yes Alycia Rossetti, NP  VITAMIN D, CHOLECALCIFEROL, PO Take 10,000 Units by mouth daily.   Yes [provider]     Critical care time:

## 2022-03-12 NOTE — Progress Notes (Signed)
ETT advanced from 24 measured from the lips to 26 measured from the lips per MD. RN at bedside,pt tolerating well, RT will monitor.

## 2022-03-12 NOTE — Progress Notes (Addendum)
[facility] Progress Note Patient Name: Eric [name] DOB: Jul 05, 1950 MRN: [id number]   Date of Service  03/12/2022  HPI/Events of Note  Ionized calcium 0.84  eICU Interventions  Calcium replaced per E-Link electrolyte replacement protocol.        Kerry Kass Rakia Frayne 03/12/2022, 5:49 AM

## 2022-03-12 NOTE — Progress Notes (Signed)
Sodium Bicarb gtt stopped per Care order instruction by Dr. Moshe Cipro. Directed that it could be stopped when CRRT was started.   Milford Cage, RN

## 2022-03-12 NOTE — Progress Notes (Signed)
Subjective:  febrile overnight -  still requiring high dose pressors-  minimal UOP -  very much positive fluid status wise-  has not improved much overnight -  platelets 24K   Objective Vital signs in last 24 hours: Vitals:   03/12/22 0615 03/12/22 0630 03/12/22 0643 03/12/22 0645  BP:      Pulse: 70 68 69 69  Resp:      Temp:      TempSrc:      SpO2: 93% 92% 93% 93%  Weight:      Height:       Weight change: 23.1 kg  Intake/Output Summary (Last 24 hours) at 03/12/2022 4098 Last data filed at 03/12/2022 1191 Gross per 24 hour  Intake 9208.49 ml  Output 215 ml  Net 8993.49 ml     Assessment/Plan: 71 year old WM-  pretty healthy at baseline-  presents with sepsis secondary to cholecystitis  complicated by AKI 1.Renal- baseline crt 1.18 in August of 23.  Now AKI due to severe sepsis/ MSOF reaction to acute cholecystitis-  on pressors, VDRF.  Crt rising-  oliguoanuric.  There are no absolute indications for dialysis-  bicarb is better but line is in and I can make the argument that getting his numbers more normalized could help him recover from this illness -  will start CRRT today  2. Hypertension/volume  - very hypotensive-  req high dose pressor support at present-  sepsis type picture-  also has been adequately hydrated-  now 17  liters positive  Fi02 still OK at 40--- will start by keeping even with CRRT  3. Cholecystitis-  possibly abscess as well ?  S/p per chole tube and on maxepime and flagyl-  no positive blood cultures as of yet -  still febrile,  WBC climbing, still unstable-  surgery following -  this is the main issue- I know he is unstable but I think there needs to be consideration for chole for definitive treatment  4. Anemia  - not an issue at this point  5. Metabolic acidosis-  bicarb drip for now -  is better-  will correct with CRRT    Louis Meckel    Labs: Basic Metabolic Panel: Recent Labs  Lab 03/11/22 0336 03/11/22 0532 03/11/22 1600  03/11/22 1811 03/12/22 0438  NA 133*   < > 130* 127* 127*  K 4.7   < > 5.0 4.8 3.9  CL 101  --  93*  --  91*  CO2 12*  --  16*  --  18*  GLUCOSE 149*  --  241*  --  262*  BUN 68*  --  68*  --  73*  CREATININE 5.45*  --  5.89*  --  6.55*  CALCIUM 8.0*  --  6.5*  --  5.9*   < > = values in this interval not displayed.   Liver Function Tests: Recent Labs  Lab 03/11/2022 1104 03/11/22 0336  AST 95* 198*  ALT 45* 74*  ALKPHOS 520* 198*  BILITOT 3.3* 3.8*  PROT 5.0* 4.6*  ALBUMIN 2.2* 1.8*   Recent Labs  Lab 03/11/22 0336  LIPASE 26   No results for input(s): "AMMONIA" in the last 168 hours. CBC: Recent Labs  Lab 03/16/2022 1104 03/16/2022 1128 03/11/22 0336 03/11/22 0532 03/11/22 1607 03/11/22 1811 03/12/22 0438  WBC 6.9  --  25.5*  --  24.3*  --  29.4*  NEUTROABS 4.8  --   --   --   --   --   --  HGB 14.3   < > 13.4   < > 12.0* 11.6* 12.7*  HCT 42.8   < > 38.5*   < > 35.4* 34.0* 35.4*  MCV 102.1*  --  100.0  --  100.9*  --  96.7  PLT 30*   < > 28*  --  24*  --  23*   < > = values in this interval not displayed.   Cardiac Enzymes: No results for input(s): "CKTOTAL", "CKMB", "CKMBINDEX", "TROPONINI" in the last 168 hours. CBG: Recent Labs  Lab 03/11/22 1146 03/11/22 1601 03/11/22 1948 03/11/22 2310 03/12/22 0443  GLUCAP 156* 218* 247* 256* 246*    Iron Studies: No results for input(s): "IRON", "TIBC", "TRANSFERRIN", "FERRITIN" in the last 72 hours. Studies/Results: DG CHEST PORT 1 VIEW  Result Date: 03/11/2022 CLINICAL DATA:  Encounter for central line. EXAM: PORTABLE CHEST 1 VIEW COMPARISON:  Chest x-ray from the same day. FINDINGS: Right IJ approach central venous catheter with tip projecting at the lower SVC. No visible pneumothorax. Endotracheal tube tip at the level of the clavicular heads. Gastric tube courses below the diaphragm. Mild interstitial prominence. Mild bibasilar opacities IMPRESSION: 1. Right IJ approach central venous catheter with tip  projecting at the lower SVC. No visible pneumothorax. 2. Mild interstitial opacities, which could represent interstitial pulmonary edema versus the sequela of recurrent bouts of CHF. 3. Mild bibasilar opacities, which could represent atelectasis and/or consolidation. Electronically Signed   By: Margaretha Sheffield M.D.   On: 03/11/2022 15:07   ECHOCARDIOGRAM COMPLETE  Result Date: 03/11/2022    ECHOCARDIOGRAM REPORT   Patient Name:   MEGAN HAYDUK Date of Exam: 03/11/2022 Medical Rec #:  [id number]        Height:       72.0 in Accession #:    [id number]       Weight:       210.0 lb Date of Birth:  [birth year]       BSA:          2.175 m Patient Age:    52 years         BP:           84/65 mmHg Patient Gender: M                HR:           75 bpm. Exam Location:  Inpatient Procedure: 2D Echo, Intracardiac Opacification Agent, Cardiac Doppler and Color            Doppler Indications:    Elevated troponin  History:        Patient has no prior history of Echocardiogram examinations.                 COPD; Risk Factors:Hypertension and Dyslipidemia. Aortic                 atherosclerosis, emphysema.  Sonographer:    Eartha Inch Referring Phys: [id number] VINEET SOOD  Sonographer Comments: Technically difficult study due to poor echo windows and echo performed with patient supine and on artificial respirator. Image acquisition challenging due to patient body habitus, Image acquisition challenging due to respiratory motion and Image acquisition challenging due to COPD. EF 35-40%. IMPRESSIONS  1. Left ventricular ejection fraction, by estimation, is 65 to 70%. The left ventricle has normal function. The left ventricle has no regional wall motion abnormalities. Left ventricular diastolic parameters were normal.  2. Right ventricular systolic function is normal. The right ventricular size  is normal. Tricuspid regurgitation signal is inadequate for assessing PA pressure.  3. The mitral valve was not well visualized. Trivial  mitral valve regurgitation. No evidence of mitral stenosis.  4. The aortic valve was not well visualized. Aortic valve regurgitation is not visualized. No aortic stenosis is present.  5. The inferior vena cava is normal in size with greater than 50% respiratory variability, suggesting right atrial pressure of 3 mmHg. Comparison(s): No prior Echocardiogram. Conclusion(s)/Recommendation(s): Very limited echo windows. Grossly normal LVEF, with underfilled appearing ventricle. Normal RV size/function. IVC collapses. Valves not well visualized, but no significant stenosis/regurgitation by Doppler. FINDINGS  Left Ventricle: Left ventricular ejection fraction, by estimation, is 65 to 70%. The left ventricle has normal function. The left ventricle has no regional wall motion abnormalities. Definity contrast agent was given IV to delineate the left ventricular  endocardial borders. The left ventricular internal cavity size was small. Suboptimal image quality limits for assessment of left ventricular hypertrophy. Left ventricular diastolic parameters were normal. Right Ventricle: The right ventricular size is normal. Right vetricular wall thickness was not well visualized. Right ventricular systolic function is normal. Tricuspid regurgitation signal is inadequate for assessing PA pressure. Left Atrium: Left atrial size was not well visualized. Right Atrium: Right atrial size was not well visualized. Pericardium: There is no evidence of pericardial effusion. Mitral Valve: The mitral valve was not well visualized. Trivial mitral valve regurgitation. No evidence of mitral valve stenosis. Tricuspid Valve: The tricuspid valve is not well visualized. Tricuspid valve regurgitation is trivial. No evidence of tricuspid stenosis. Aortic Valve: The aortic valve was not well visualized. Aortic valve regurgitation is not visualized. No aortic stenosis is present. Pulmonic Valve: The pulmonic valve was not well visualized. Pulmonic valve  regurgitation is not visualized. Aorta: The ascending aorta was not well visualized, the aortic arch was not well visualized and the aortic root was not well visualized. Venous: The inferior vena cava is normal in size with greater than 50% respiratory variability, suggesting right atrial pressure of 3 mmHg. IAS/Shunts: The interatrial septum was not well visualized.  LEFT VENTRICLE PLAX 2D LVIDd:         3.97 cm   Diastology LVIDs:         2.52 cm   LV e' medial:    8.92 cm/s LV PW:         1.20 cm   LV E/e' medial:  8.8 LV IVS:        0.80 cm   LV e' lateral:   13.90 cm/s LVOT diam:     2.30 cm   LV E/e' lateral: 5.7 LV SV:         91 LV SV Index:   42 LVOT Area:     4.15 cm  RIGHT VENTRICLE             IVC RV S prime:     12.80 cm/s  IVC diam: 2.00 cm TAPSE (M-mode): 2.0 cm LEFT ATRIUM           Index        RIGHT ATRIUM           Index LA diam:      3.50 cm 1.61 cm/m   RA Area:     12.60 cm LA Vol (A4C): 56.4 ml 25.93 ml/m  RA Volume:   30.40 ml  13.98 ml/m  AORTIC VALVE LVOT Vmax:   137.00 cm/s LVOT Vmean:  95.700 cm/s LVOT VTI:    0.219 m  AORTA Ao Root diam: 3.40 cm Ao Asc diam:  3.40 cm MITRAL VALVE MV Area (PHT): 3.48 cm    SHUNTS MV Decel Time: 218 msec    Systemic VTI:  0.22 m MR Peak grad: 5.3 mmHg     Systemic Diam: 2.30 cm MR Vmax:      115.00 cm/s MV E velocity: 78.80 cm/s MV A velocity: 32.30 cm/s MV E/A ratio:  2.44 Buford Dresser MD Electronically signed by Buford Dresser MD Signature Date/Time: 03/11/2022/12:21:40 PM    Final    DG CHEST PORT 1 VIEW  Result Date: 03/11/2022 CLINICAL DATA:  Hypoxia EXAM: PORTABLE CHEST 1 VIEW COMPARISON:  03/05/2022 FINDINGS: Cardiac shadow is stable. Endotracheal tube, gastric catheter and left jugular central line are noted in satisfactory position. No focal infiltrate or effusion is seen. No bony abnormality is noted. IMPRESSION: No active disease. Electronically Signed   By: Inez Catalina M.D.   On: 03/11/2022 03:47   DG Abd 1  View  Result Date: 03/11/2022 CLINICAL DATA:  Hypoxia and abdominal pain EXAM: ABDOMEN - 1 VIEW COMPARISON:  None Available. FINDINGS: Cholecystostomy tube is noted in satisfactory position. No renal or ureteral calculi seen. Gastric catheter extends into the stomach. No bony abnormality is seen. IMPRESSION: No acute abnormality noted. Electronically Signed   By: Inez Catalina M.D.   On: 03/11/2022 03:47   DG CHEST PORT 1 VIEW  Result Date: 03/14/2022 CLINICAL DATA:  Septic shock, intubated EXAM: PORTABLE CHEST 1 VIEW COMPARISON:  03/11/2022 FINDINGS: Single frontal view of the chest demonstrates endotracheal tube overlying tracheal air column tip midway between thoracic inlet and carina. Enteric catheter passes below diaphragm tip excluded by collimation. Left internal jugular catheter tip overlies superior vena cava. External defibrillator pads overlie the left chest. Cardiac silhouette is unremarkable. There is increased central vascular congestion, with developing bilateral perihilar ground-glass airspace disease. No effusion or pneumothorax. No acute bony abnormalities. IMPRESSION: 1. Support devices as above. 2. Increased vascular congestion with progressive bilateral perihilar ground-glass airspace disease, most consistent with fluid overload and mild edema. Electronically Signed   By: Randa Ngo M.D.   On: 03/04/2022 22:29   IR Perc Cholecystostomy  Result Date: 03/09/2022 INDICATION: Acute cholecystitis and sepsis. Percutaneous cholecystostomy E required as the patient is not a candidate for cholecystectomy currently. EXAM: PERCUTANEOUS CHOLECYSTOSTOMY TUBE PLACEMENT MEDICATIONS: None ANESTHESIA/SEDATION: Formal conscious sedation was not performed due to critical illness and poor airway. The patient was given 1.0 mg IV Versed during the procedure. FLUOROSCOPY: Radiation Exposure Index (as provided by the fluoroscopic device): 1.0 mGy Kerma COMPLICATIONS: None immediate. PROCEDURE: Informed  written consent was obtained from the patient's wife after a thorough discussion of the procedural risks, benefits and alternatives. All questions were addressed. Maximal Sterile Barrier Technique was utilized including caps, mask, sterile gowns, sterile gloves, sterile drape, hand hygiene and skin antiseptic. A timeout was performed prior to the initiation of the procedure. Ultrasound was used to localize the gallbladder. Local anesthesia was provided with 1% lidocaine. An 18 gauge trocar needle was advanced into the gallbladder lumen under ultrasound guidance. After confirming needle tip position and with return of bile, a guidewire was advanced into the gallbladder lumen and the needle removed. The tract was dilated to 10 [place]. A 10 French percutaneous drain was advanced into the gallbladder. A sample of bile was then withdrawn through the drain and sent for culture analysis. Contrast was injected via the cholecystostomy tube and a fluoroscopic spot image obtained. The catheter was  then flushed with sterile saline and attached to a gravity drainage bag. It was secured at the skin with a Prolene retention suture and StatLock device. FINDINGS: Ultrasound demonstrates inflamed gallbladder with gallbladder wall thickening as well as edematous wall and edema surrounding the gallbladder fossa within the liver. A discrete hepatic abscess is not identified with small pockets of fluid and edema adjacent to the gallbladder within the liver parenchyma. After placement of the cholecystostomy tube there is return of cloudy purulent appearing bile. A sample was sent for culture analysis. IMPRESSION: Placement of percutaneous cholecystostomy tube into the gallbladder. A sample of purulent bile was sent for culture analysis. Initial ultrasound demonstrates adjacent areas of fluid and edema within the liver adjacent to the gallbladder fossa. A discrete unilocular abscess was not identified. Electronically Signed   By: Aletta Edouard M.D.   On: 03/11/2022 16:35   US Abdomen Limited RUQ (LIVER/GB)  Result Date: 03/14/2022 CLINICAL DATA:  Right upper quadrant pain. EXAM: ULTRASOUND ABDOMEN LIMITED RIGHT UPPER QUADRANT COMPARISON:  CT abdomen and pelvis 02/27/2022 FINDINGS: Gallbladder: The gallbladder wall is again thickened, measuring up to 7 mm. The sonographer reports a positive sonographic Murphy's sign" with patient pain when the probe is pressed on the gallbladder. There is again mild pericholecystic fluid. No gallstone is seen. Common bile duct: Diameter: 5 mm, within normal limits. Liver: There is a hypoechoic region with lobular margins just anterior and superior to the gallbladder corresponding to the dense fluid region on today's CT. This measures up to approximately 4.5 x 3.0 x 4.2 cm. There is heterogeneous hepatic echotexture. Portal vein is patent on color Doppler imaging with normal direction of blood flow towards the liver. Other: None. IMPRESSION: 1. There is again gallbladder wall thickening and pericholecystic fluid. Positive sonographic Murphy's sign. Findings suggest acalculous cholecystitis. Note is made that there was primary or secondary inflammatory change within the adjacent hepatic flexure of the colon on today's CT. It is unclear whether the primary cause of the inflammation is the gallbladder, colon, or both. 2. Mildly complex fluid collection adjacent to the gallbladder fossa as seen on CT. Differential considerations again include a possible complex cyst or abscess. Electronically Signed   By: Yvonne Kendall M.D.   On: 03/09/2022 15:19   CT ABDOMEN PELVIS W CONTRAST  Result Date: 02/23/2022 CLINICAL DATA:  Abdominal pain, acute, nonlocalized EXAM: CT ABDOMEN AND PELVIS WITH CONTRAST TECHNIQUE: Multidetector CT imaging of the abdomen and pelvis was performed using the standard protocol following bolus administration of intravenous contrast. RADIATION DOSE REDUCTION: This exam was performed according  to the departmental dose-optimization program which includes automated exposure control, adjustment of the mA and/or kV according to patient size and/or use of iterative reconstruction technique. CONTRAST:  38m OMNIPAQUE IOHEXOL 350 MG/ML SOLN COMPARISON:  None Available. FINDINGS: Lower chest: Bibasilar subsegmental atelectasis. Heart size within normal limits. Gynecomastia is evident on the right. Hepatobiliary: Abnormal appearance of the gallbladder which is moderately dilated with wall thickening, pericholecystic fluid, and pericholecystic fat stranding. No hyperdense gallstone. There is an irregular hypoattenuating lesion within the liver adjacent to the gallbladder fossa measuring approximately 4.4 x 2.5 x 3.6 cm with internal density slightly greater than fluid. Liver appears otherwise unremarkable. No intrahepatic biliary dilatation. Pancreas: Unremarkable. No pancreatic ductal dilatation or surrounding inflammatory changes. Spleen: Normal in size without focal abnormality. Adrenals/Urinary Tract: Unremarkable adrenal glands. Kidneys enhance symmetrically without solid lesion, stone, or hydronephrosis. Ureters are nondilated. Urinary bladder is decompressed by Foley catheter. Stomach/Bowel: Long  segment wall thickening of the colon centered at the hepatic flexure, which may be reactive secondary to local inflammation associated with the gallbladder. Sigmoid diverticulosis. Normal appendix in the right lower quadrant. No dilated loops of bowel. Small hiatal hernia. Vascular/Lymphatic: Scattered aortoiliac atherosclerotic calcifications without aneurysm. No abdominopelvic lymphadenopathy. Reproductive: Prostate is unremarkable. Other: No ascites. No abdominopelvic fluid collection. No pneumoperitoneum. No abdominal wall hernia. Musculoskeletal: No acute or significant osseous findings. IMPRESSION: 1. Appearance of the gallbladder is highly suspicious for acute cholecystitis. Consider further evaluation with  right upper quadrant ultrasound. 2. Irregular low-density lesion within the liver adjacent to the gallbladder fossa measuring up to 4.4 cm. This could represent a hepatic abscess, complex cyst, or neoplasm. 3. Long segment wall thickening of the colon centered at the hepatic flexure, which may be reactive secondary to local inflammation associated with the gallbladder versus a nonspecific colitis. 4. Sigmoid diverticulosis without evidence of acute diverticulitis. 5. Aortic Atherosclerosis (ICD10-I70.0). Electronically Signed   By: Davina Poke D.O.   On: 03/05/2022 14:02   CT Angio Chest PE W and/or Wo Contrast  Result Date: 03/21/2022 CLINICAL DATA:  Chest pain. EXAM: CT ANGIOGRAPHY CHEST WITH CONTRAST TECHNIQUE: Multidetector CT imaging of the chest was performed using the standard protocol during bolus administration of intravenous contrast. Multiplanar CT image reconstructions and MIPs were obtained to evaluate the vascular anatomy. RADIATION DOSE REDUCTION: This exam was performed according to the departmental dose-optimization program which includes automated exposure control, adjustment of the mA and/or kV according to patient size and/or use of iterative reconstruction technique. CONTRAST:  34m OMNIPAQUE IOHEXOL 350 MG/ML SOLN COMPARISON:  June 28, 2018. FINDINGS: Cardiovascular: Satisfactory opacification of the pulmonary arteries to the segmental level. No evidence of pulmonary embolism. Normal heart size. No pericardial effusion. Mediastinum/Nodes: No enlarged mediastinal, hilar, or axillary lymph nodes. Thyroid gland, trachea, and esophagus demonstrate no significant findings. Lungs/Pleura: Lungs are clear. No pleural effusion or pneumothorax. Upper Abdomen: No acute abnormality. Musculoskeletal: No chest wall abnormality. No acute or significant osseous findings. Review of the MIP images confirms the above findings. IMPRESSION: No definite evidence of pulmonary embolus. No acute abnormality  seen in the chest. Electronically Signed   By: JMarijo ConceptionM.D.   On: 02/27/2022 13:58   CT Head Wo Contrast  Result Date: 03/11/2022 CLINICAL DATA:  Head trauma. EXAM: CT HEAD WITHOUT CONTRAST TECHNIQUE: Contiguous axial images were obtained from the base of the skull through the vertex without intravenous contrast. RADIATION DOSE REDUCTION: This exam was performed according to the departmental dose-optimization program which includes automated exposure control, adjustment of the mA and/or kV according to patient size and/or use of iterative reconstruction technique. COMPARISON:  None Available. FINDINGS: Brain: No evidence of acute infarction, hemorrhage, hydrocephalus, extra-axial collection or mass lesion/mass effect. There are sequela of moderate chronic microvascular ischemic change with advanced generalized volume loss. Vascular: No hyperdense vessel or unexpected calcification. Skull: Normal. Negative for fracture or focal lesion. Sinuses/Orbits: Bilateral lens replacement. Mild mucosal thickening bilateral maxillary and ethmoid sinuses, as well as right sphenoid sinus. Other: None. IMPRESSION: No CT evidence of acute intracranial injury. Electronically Signed   By: HMarin RobertsM.D.   On: 03/15/2022 13:51   DG Chest Port 1 View  Result Date: 03/01/2022 CLINICAL DATA:  Provided history: Questionable sepsis-evaluate for abnormality. EXAM: PORTABLE CHEST 1 VIEW COMPARISON:  Prior chest radiographs 09/12/2020 and earlier. FINDINGS: Heart size within normal limits. Aortic atherosclerosis. Minimal atelectasis within the right lung base. No appreciable airspace consolidation  or pulmonary edema. No evidence of pleural effusion or pneumothorax. No acute bony abnormality identified. IMPRESSION: Minimal atelectasis within the right lung base. Otherwise, no evidence of acute cardiopulmonary abnormality. Aortic Atherosclerosis (ICD10-I70.0). Electronically Signed   By: Deanthony Maull Simmering D.O.   On: 03/15/2022  12:23   Medications: Infusions:  sodium chloride     sodium chloride Stopped (03/11/22 0809)   amiodarone 30 mg/hr (03/12/22 0600)   calcium gluconate 1,000 mg (03/12/22 4034)   calcium gluconate     ceFEPime (MAXIPIME) IV Stopped (03/11/22 1223)   dexmedetomidine (PRECEDEX) IV infusion 0.4 mcg/kg/hr (03/12/22 7425)   epinephrine Stopped (03/11/22 1425)   fentaNYL infusion INTRAVENOUS 75 mcg/hr (03/12/22 0600)   metronidazole Stopped (03/11/22 2101)   norepinephrine (LEVOPHED) Adult infusion 28 mcg/min (03/12/22 0600)   phenylephrine (NEO-SYNEPHRINE) Adult infusion Stopped (03/11/22 0808)   sodium bicarbonate 150 mEq in dextrose 5 % 1,150 mL infusion 125 mL/hr at 03/12/22 0600   vasopressin 0.04 Units/min (03/12/22 0600)    Scheduled Medications:  sodium chloride   Intravenous Once   Chlorhexidine Gluconate Cloth  6 each Topical Daily   hydrocortisone sod succinate (SOLU-CORTEF) inj  100 mg Intravenous Q12H   insulin aspart  0-9 Units Subcutaneous Q4H   mouth rinse  15 mL Mouth Rinse Q2H   pantoprazole (PROTONIX) IV  40 mg Intravenous Daily   sodium chloride flush  10-40 mL Intracatheter Q12H   sodium chloride flush  5 mL Intracatheter Q8H    have reviewed scheduled and prn medications.  Physical Exam: General: mottled extremities -  sedated on vent Heart: RRR Lungs: really mostly clear Abdomen: distended-  chole drain Extremities: mottled  Dialysis Access: right sided temp cath- placed 9/20    03/12/2022,7:14 AM  LOS: 2 days

## 2022-03-12 NOTE — Progress Notes (Signed)
Referring Physician(s): Saverio Danker, PA-C  Supervising Physician: Corrie Mckusick  Patient Status:  Eric [name]  Chief Complaint:  Septic shock secondary to acute cholecystitis, s/p percutaneous cholecystostomy drain placement 03/21/2022 with Dr Kathlene Cote  Subjective:  Patient condition unchanged, remains critically ill and intubated. Patient receiving CRRT at time of exam. Patient's wife Eric [name] at bedside and states she has no questions about the drain.  Allergies: Lipitor [atorvastatin], Lopid [gemfibrozil], and Penicillins  Medications: Prior to Admission medications   Medication Sig Start Date End Date Taking? Authorizing Provider  calcium carbonate (TUMS - DOSED IN MG ELEMENTAL CALCIUM) 500 MG chewable tablet Chew 1,000 mg by mouth daily as needed for indigestion or heartburn.   Yes [provider]  citalopram (CELEXA) 20 MG tablet Take 1 tablet (20 mg total) by mouth daily. 07/16/21 07/16/22 Yes Liane Comber, NP  Cyanocobalamin (B-12 PO) Take 1 capsule by mouth daily.   Yes [provider]  ibuprofen (ADVIL) 200 MG tablet Take 400 mg by mouth every 4 (four) hours as needed for moderate pain.   Yes [provider]  rosuvastatin (CRESTOR) 5 MG tablet TAKE 1 TABLET BY MOUTH DAILY FOR CHOLESTEROL AND PLAQUE ON ARTERIES Patient taking differently: Take 5 mg by mouth daily. 12/02/21  Yes Alycia Rossetti, NP  VITAMIN D, CHOLECALCIFEROL, PO Take 10,000 Units by mouth daily.   Yes [provider]     Vital Signs: BP 111/62   Pulse (!) 51   Temp 98.1 F (36.7 C) (Axillary)   Resp (!) 24   Ht 6' (1.829 m)   Wt 261 lb 0.4 oz (118.4 kg)   SpO2 98%   BMI 35.40 kg/m   Physical Exam Vitals reviewed.  Constitutional:      Appearance: Eric [name] is ill-appearing.  Pulmonary:     Comments: Patient intubated Abdominal:     Palpations: Abdomen is soft.  Skin:    Comments: Suture and stat lock in place. Skin with no signs of bleeding or drainage at  insertion site. Skin under the inferior edge of the dressing appears irritated where tape meets the skin. Skin is red at the site, but this does not extend past where the tape touches. Dressing clean, dry, and intact.    Drain Location: RUQ Size: Fr size: 10 Fr Date of placement: 03/06/2022  Currently to: Drain collection device: gravity 24 hour output:  Output by Drain (mL) 03/09/2022 0701 - 03/08/2022 1900 03/04/2022 1901 - 03/11/22 0700 03/11/22 0701 - 03/11/22 1900 03/11/22 1901 - 03/12/22 0700 03/12/22 0701 - 03/12/22 1601  Biliary Tube Cook slip-coat 10.2 Fr.  150 75 60 45    Interval imaging/drain manipulation:  None  Current examination: Flushes/aspirates easily.  Insertion site unremarkable. Suture and stat lock in place. Dressed appropriately.  Low volume of yellow-brown liquid in gravity bag at time of exam. RN reports 45cc of fluid in bag emptied a short time before the exam.     Imaging: DG CHEST PORT 1 VIEW  Result Date: 03/12/2022 CLINICAL DATA:  Respiratory failure. EXAM: PORTABLE CHEST 1 VIEW COMPARISON:  Radiographs 03/11/2022 and 02/25/2022.  CT 02/21/2022. FINDINGS: 1227 hours. Two views obtained. Endotracheal tube, bilateral internal jugular central venous catheters and an enteric tube are unchanged in position. The heart size and mediastinal contours are stable. Patchy left basilar airspace disease appears slightly improved. The right lung appears clear for the degree of inspiration. No significant pleural effusion or pneumothorax. Telemetry leads overlie the chest. IMPRESSION: Interval partial  clearing of left lower lobe airspace disease. Stable support system. Electronically Signed   By: Richardean Sale M.D.   On: 03/12/2022 13:12   DG CHEST PORT 1 VIEW  Result Date: 03/11/2022 CLINICAL DATA:  Encounter for central line. EXAM: PORTABLE CHEST 1 VIEW COMPARISON:  Chest x-ray from the same day. FINDINGS: Right IJ approach central venous catheter with tip projecting at the  lower SVC. No visible pneumothorax. Endotracheal tube tip at the level of the clavicular heads. Gastric tube courses below the diaphragm. Mild interstitial prominence. Mild bibasilar opacities IMPRESSION: 1. Right IJ approach central venous catheter with tip projecting at the lower SVC. No visible pneumothorax. 2. Mild interstitial opacities, which could represent interstitial pulmonary edema versus the sequela of recurrent bouts of CHF. 3. Mild bibasilar opacities, which could represent atelectasis and/or consolidation. Electronically Signed   By: Margaretha Sheffield M.D.   On: 03/11/2022 15:07   ECHOCARDIOGRAM COMPLETE  Result Date: 03/11/2022    ECHOCARDIOGRAM REPORT   Patient Name:   Eric [name] Date of Exam: 03/11/2022 Medical Rec #:  [id number]        Height:       72.0 in Accession #:    [id number]       Weight:       210.0 lb Date of Birth:  [birth year]       BSA:          2.175 m Patient Age:    71 years         BP:           84/65 mmHg Patient Gender: M                HR:           75 bpm. Exam Location:  Inpatient Procedure: 2D Echo, Intracardiac Opacification Agent, Cardiac Doppler and Color            Doppler Indications:    Elevated troponin  History:        Patient has no prior history of Echocardiogram examinations.                 COPD; Risk Factors:Hypertension and Dyslipidemia. Aortic                 atherosclerosis, emphysema.  Sonographer:    Eartha Inch Referring Phys: [id number] VINEET SOOD  Sonographer Comments: Technically difficult study due to poor echo windows and echo performed with patient supine and on artificial respirator. Image acquisition challenging due to patient body habitus, Image acquisition challenging due to respiratory motion and Image acquisition challenging due to COPD. EF 35-40%. IMPRESSIONS  1. Left ventricular ejection fraction, by estimation, is 65 to 70%. The left ventricle has normal function. The left ventricle has no regional wall motion abnormalities. Left  ventricular diastolic parameters were normal.  2. Right ventricular systolic function is normal. The right ventricular size is normal. Tricuspid regurgitation signal is inadequate for assessing PA pressure.  3. The mitral valve was not well visualized. Trivial mitral valve regurgitation. No evidence of mitral stenosis.  4. The aortic valve was not well visualized. Aortic valve regurgitation is not visualized. No aortic stenosis is present.  5. The inferior vena cava is normal in size with greater than 50% respiratory variability, suggesting right atrial pressure of 3 mmHg. Comparison(s): No prior Echocardiogram. Conclusion(s)/Recommendation(s): Very limited echo windows. Grossly normal LVEF, with underfilled appearing ventricle. Normal RV size/function. IVC collapses. Valves not well visualized, but no significant stenosis/regurgitation by  Doppler. FINDINGS  Left Ventricle: Left ventricular ejection fraction, by estimation, is 65 to 70%. The left ventricle has normal function. The left ventricle has no regional wall motion abnormalities. Definity contrast agent was given IV to delineate the left ventricular  endocardial borders. The left ventricular internal cavity size was small. Suboptimal image quality limits for assessment of left ventricular hypertrophy. Left ventricular diastolic parameters were normal. Right Ventricle: The right ventricular size is normal. Right vetricular wall thickness was not well visualized. Right ventricular systolic function is normal. Tricuspid regurgitation signal is inadequate for assessing PA pressure. Left Atrium: Left atrial size was not well visualized. Right Atrium: Right atrial size was not well visualized. Pericardium: There is no evidence of pericardial effusion. Mitral Valve: The mitral valve was not well visualized. Trivial mitral valve regurgitation. No evidence of mitral valve stenosis. Tricuspid Valve: The tricuspid valve is not well visualized. Tricuspid valve  regurgitation is trivial. No evidence of tricuspid stenosis. Aortic Valve: The aortic valve was not well visualized. Aortic valve regurgitation is not visualized. No aortic stenosis is present. Pulmonic Valve: The pulmonic valve was not well visualized. Pulmonic valve regurgitation is not visualized. Aorta: The ascending aorta was not well visualized, the aortic arch was not well visualized and the aortic root was not well visualized. Venous: The inferior vena cava is normal in size with greater than 50% respiratory variability, suggesting right atrial pressure of 3 mmHg. IAS/Shunts: The interatrial septum was not well visualized.  LEFT VENTRICLE PLAX 2D LVIDd:         3.97 cm   Diastology LVIDs:         2.52 cm   LV e' medial:    8.92 cm/s LV PW:         1.20 cm   LV E/e' medial:  8.8 LV IVS:        0.80 cm   LV e' lateral:   13.90 cm/s LVOT diam:     2.30 cm   LV E/e' lateral: 5.7 LV SV:         91 LV SV Index:   42 LVOT Area:     4.15 cm  RIGHT VENTRICLE             IVC RV S prime:     12.80 cm/s  IVC diam: 2.00 cm TAPSE (M-mode): 2.0 cm LEFT ATRIUM           Index        RIGHT ATRIUM           Index LA diam:      3.50 cm 1.61 cm/m   RA Area:     12.60 cm LA Vol (A4C): 56.4 ml 25.93 ml/m  RA Volume:   30.40 ml  13.98 ml/m  AORTIC VALVE LVOT Vmax:   137.00 cm/s LVOT Vmean:  95.700 cm/s LVOT VTI:    0.219 m  AORTA Ao Root diam: 3.40 cm Ao Asc diam:  3.40 cm MITRAL VALVE MV Area (PHT): 3.48 cm    SHUNTS MV Decel Time: 218 msec    Systemic VTI:  0.22 m MR Peak grad: 5.3 mmHg     Systemic Diam: 2.30 cm MR Vmax:      115.00 cm/s MV E velocity: 78.80 cm/s MV A velocity: 32.30 cm/s MV E/A ratio:  2.44 Buford Dresser MD Electronically signed by Buford Dresser MD Signature Date/Time: 03/11/2022/12:21:40 PM    Final    DG CHEST PORT 1 VIEW  Result Date: 03/11/2022 CLINICAL  DATA:  Hypoxia EXAM: PORTABLE CHEST 1 VIEW COMPARISON:  02/22/2022 FINDINGS: Cardiac shadow is stable. Endotracheal tube, gastric  catheter and left jugular central line are noted in satisfactory position. No focal infiltrate or effusion is seen. No bony abnormality is noted. IMPRESSION: No active disease. Electronically Signed   By: Inez Catalina M.D.   On: 03/11/2022 03:47   DG Abd 1 View  Result Date: 03/11/2022 CLINICAL DATA:  Hypoxia and abdominal pain EXAM: ABDOMEN - 1 VIEW COMPARISON:  None Available. FINDINGS: Cholecystostomy tube is noted in satisfactory position. No renal or ureteral calculi seen. Gastric catheter extends into the stomach. No bony abnormality is seen. IMPRESSION: No acute abnormality noted. Electronically Signed   By: Inez Catalina M.D.   On: 03/11/2022 03:47   DG CHEST PORT 1 VIEW  Result Date: 02/22/2022 CLINICAL DATA:  Septic shock, intubated EXAM: PORTABLE CHEST 1 VIEW COMPARISON:  03/04/2022 FINDINGS: Single frontal view of the chest demonstrates endotracheal tube overlying tracheal air column tip midway between thoracic inlet and carina. Enteric catheter passes below diaphragm tip excluded by collimation. Left internal jugular catheter tip overlies superior vena cava. External defibrillator pads overlie the left chest. Cardiac silhouette is unremarkable. There is increased central vascular congestion, with developing bilateral perihilar ground-glass airspace disease. No effusion or pneumothorax. No acute bony abnormalities. IMPRESSION: 1. Support devices as above. 2. Increased vascular congestion with progressive bilateral perihilar ground-glass airspace disease, most consistent with fluid overload and mild edema. Electronically Signed   By: Randa Ngo M.D.   On: 02/28/2022 22:29   IR Perc Cholecystostomy  Result Date: 02/24/2022 INDICATION: Acute cholecystitis and sepsis. Percutaneous cholecystostomy E required as the patient is not a candidate for cholecystectomy currently. EXAM: PERCUTANEOUS CHOLECYSTOSTOMY TUBE PLACEMENT MEDICATIONS: None ANESTHESIA/SEDATION: Formal conscious sedation was not  performed due to critical illness and poor airway. The patient was given 1.0 mg IV Versed during the procedure. FLUOROSCOPY: Radiation Exposure Index (as provided by the fluoroscopic device): 1.0 mGy Kerma COMPLICATIONS: None immediate. PROCEDURE: Informed written consent was obtained from the patient's wife after a thorough discussion of the procedural risks, benefits and alternatives. All questions were addressed. Maximal Sterile Barrier Technique was utilized including caps, mask, sterile gowns, sterile gloves, sterile drape, hand hygiene and skin antiseptic. A timeout was performed prior to the initiation of the procedure. Ultrasound was used to localize the gallbladder. Local anesthesia was provided with 1% lidocaine. An 18 gauge trocar needle was advanced into the gallbladder lumen under ultrasound guidance. After confirming needle tip position and with return of bile, a guidewire was advanced into the gallbladder lumen and the needle removed. The tract was dilated to 10 [place]. A 10 French percutaneous drain was advanced into the gallbladder. A sample of bile was then withdrawn through the drain and sent for culture analysis. Contrast was injected via the cholecystostomy tube and a fluoroscopic spot image obtained. The catheter was then flushed with sterile saline and attached to a gravity drainage bag. It was secured at the skin with a Prolene retention suture and StatLock device. FINDINGS: Ultrasound demonstrates inflamed gallbladder with gallbladder wall thickening as well as edematous wall and edema surrounding the gallbladder fossa within the liver. A discrete hepatic abscess is not identified with small pockets of fluid and edema adjacent to the gallbladder within the liver parenchyma. After placement of the cholecystostomy tube there is return of cloudy purulent appearing bile. A sample was sent for culture analysis. IMPRESSION: Placement of percutaneous cholecystostomy tube into the gallbladder. A  sample of purulent bile was sent for culture analysis. Initial ultrasound demonstrates adjacent areas of fluid and edema within the liver adjacent to the gallbladder fossa. A discrete unilocular abscess was not identified. Electronically Signed   By: Aletta Edouard M.D.   On: 03/15/2022 16:35   US Abdomen Limited RUQ (LIVER/GB)  Result Date: 03/14/2022 CLINICAL DATA:  Right upper quadrant pain. EXAM: ULTRASOUND ABDOMEN LIMITED RIGHT UPPER QUADRANT COMPARISON:  CT abdomen and pelvis 02/21/2022 FINDINGS: Gallbladder: The gallbladder wall is again thickened, measuring up to 7 mm. The sonographer reports a positive sonographic Murphy's sign" with patient pain when the probe is pressed on the gallbladder. There is again mild pericholecystic fluid. No gallstone is seen. Common bile duct: Diameter: 5 mm, within normal limits. Liver: There is a hypoechoic region with lobular margins just anterior and superior to the gallbladder corresponding to the dense fluid region on today's CT. This measures up to approximately 4.5 x 3.0 x 4.2 cm. There is heterogeneous hepatic echotexture. Portal vein is patent on color Doppler imaging with normal direction of blood flow towards the liver. Other: None. IMPRESSION: 1. There is again gallbladder wall thickening and pericholecystic fluid. Positive sonographic Murphy's sign. Findings suggest acalculous cholecystitis. Note is made that there was primary or secondary inflammatory change within the adjacent hepatic flexure of the colon on today's CT. It is unclear whether the primary cause of the inflammation is the gallbladder, colon, or both. 2. Mildly complex fluid collection adjacent to the gallbladder fossa as seen on CT. Differential considerations again include a possible complex cyst or abscess. Electronically Signed   By: Yvonne Kendall M.D.   On: 03/18/2022 15:19   CT ABDOMEN PELVIS W CONTRAST  Result Date: 03/14/2022 CLINICAL DATA:  Abdominal pain, acute, nonlocalized EXAM:  CT ABDOMEN AND PELVIS WITH CONTRAST TECHNIQUE: Multidetector CT imaging of the abdomen and pelvis was performed using the standard protocol following bolus administration of intravenous contrast. RADIATION DOSE REDUCTION: This exam was performed according to the departmental dose-optimization program which includes automated exposure control, adjustment of the mA and/or kV according to patient size and/or use of iterative reconstruction technique. CONTRAST:  42m OMNIPAQUE IOHEXOL 350 MG/ML SOLN COMPARISON:  None Available. FINDINGS: Lower chest: Bibasilar subsegmental atelectasis. Heart size within normal limits. Gynecomastia is evident on the right. Hepatobiliary: Abnormal appearance of the gallbladder which is moderately dilated with wall thickening, pericholecystic fluid, and pericholecystic fat stranding. No hyperdense gallstone. There is an irregular hypoattenuating lesion within the liver adjacent to the gallbladder fossa measuring approximately 4.4 x 2.5 x 3.6 cm with internal density slightly greater than fluid. Liver appears otherwise unremarkable. No intrahepatic biliary dilatation. Pancreas: Unremarkable. No pancreatic ductal dilatation or surrounding inflammatory changes. Spleen: Normal in size without focal abnormality. Adrenals/Urinary Tract: Unremarkable adrenal glands. Kidneys enhance symmetrically without solid lesion, stone, or hydronephrosis. Ureters are nondilated. Urinary bladder is decompressed by Foley catheter. Stomach/Bowel: Long segment wall thickening of the colon centered at the hepatic flexure, which may be reactive secondary to local inflammation associated with the gallbladder. Sigmoid diverticulosis. Normal appendix in the right lower quadrant. No dilated loops of bowel. Small hiatal hernia. Vascular/Lymphatic: Scattered aortoiliac atherosclerotic calcifications without aneurysm. No abdominopelvic lymphadenopathy. Reproductive: Prostate is unremarkable. Other: No ascites. No  abdominopelvic fluid collection. No pneumoperitoneum. No abdominal wall hernia. Musculoskeletal: No acute or significant osseous findings. IMPRESSION: 1. Appearance of the gallbladder is highly suspicious for acute cholecystitis. Consider further evaluation with right upper quadrant ultrasound. 2. Irregular low-density lesion within the liver adjacent  to the gallbladder fossa measuring up to 4.4 cm. This could represent a hepatic abscess, complex cyst, or neoplasm. 3. Long segment wall thickening of the colon centered at the hepatic flexure, which may be reactive secondary to local inflammation associated with the gallbladder versus a nonspecific colitis. 4. Sigmoid diverticulosis without evidence of acute diverticulitis. 5. Aortic Atherosclerosis (ICD10-I70.0). Electronically Signed   By: Davina Poke D.O.   On: 03/21/2022 14:02   CT Angio Chest PE W and/or Wo Contrast  Result Date: 03/09/2022 CLINICAL DATA:  Chest pain. EXAM: CT ANGIOGRAPHY CHEST WITH CONTRAST TECHNIQUE: Multidetector CT imaging of the chest was performed using the standard protocol during bolus administration of intravenous contrast. Multiplanar CT image reconstructions and MIPs were obtained to evaluate the vascular anatomy. RADIATION DOSE REDUCTION: This exam was performed according to the departmental dose-optimization program which includes automated exposure control, adjustment of the mA and/or kV according to patient size and/or use of iterative reconstruction technique. CONTRAST:  77m OMNIPAQUE IOHEXOL 350 MG/ML SOLN COMPARISON:  June 28, 2018. FINDINGS: Cardiovascular: Satisfactory opacification of the pulmonary arteries to the segmental level. No evidence of pulmonary embolism. Normal heart size. No pericardial effusion. Mediastinum/Nodes: No enlarged mediastinal, hilar, or axillary lymph nodes. Thyroid gland, trachea, and esophagus demonstrate no significant findings. Lungs/Pleura: Lungs are clear. No pleural effusion or  pneumothorax. Upper Abdomen: No acute abnormality. Musculoskeletal: No chest wall abnormality. No acute or significant osseous findings. Review of the MIP images confirms the above findings. IMPRESSION: No definite evidence of pulmonary embolus. No acute abnormality seen in the chest. Electronically Signed   By: JMarijo ConceptionM.D.   On: 03/01/2022 13:58   CT Head Wo Contrast  Result Date: 03/05/2022 CLINICAL DATA:  Head trauma. EXAM: CT HEAD WITHOUT CONTRAST TECHNIQUE: Contiguous axial images were obtained from the base of the skull through the vertex without intravenous contrast. RADIATION DOSE REDUCTION: This exam was performed according to the departmental dose-optimization program which includes automated exposure control, adjustment of the mA and/or kV according to patient size and/or use of iterative reconstruction technique. COMPARISON:  None Available. FINDINGS: Brain: No evidence of acute infarction, hemorrhage, hydrocephalus, extra-axial collection or mass lesion/mass effect. There are sequela of moderate chronic microvascular ischemic change with advanced generalized volume loss. Vascular: No hyperdense vessel or unexpected calcification. Skull: Normal. Negative for fracture or focal lesion. Sinuses/Orbits: Bilateral lens replacement. Mild mucosal thickening bilateral maxillary and ethmoid sinuses, as well as right sphenoid sinus. Other: None. IMPRESSION: No CT evidence of acute intracranial injury. Electronically Signed   By: HMarin RobertsM.D.   On: 03/05/2022 13:51   DG Chest Port 1 View  Result Date: 03/15/2022 CLINICAL DATA:  Provided history: Questionable sepsis-evaluate for abnormality. EXAM: PORTABLE CHEST 1 VIEW COMPARISON:  Prior chest radiographs 09/12/2020 and earlier. FINDINGS: Heart size within normal limits. Aortic atherosclerosis. Minimal atelectasis within the right lung base. No appreciable airspace consolidation or pulmonary edema. No evidence of pleural effusion or  pneumothorax. No acute bony abnormality identified. IMPRESSION: Minimal atelectasis within the right lung base. Otherwise, no evidence of acute cardiopulmonary abnormality. Aortic Atherosclerosis (ICD10-I70.0). Electronically Signed   By: KKellie SimmeringD.O.   On: 02/21/2022 12:23    Labs:  CBC: Recent Labs    03/20/2022 1104 02/24/2022 1128 03/01/2022 1454 03/04/2022 2121 03/11/22 0336 03/11/22 0532 03/11/22 1607 03/11/22 1811 03/12/22 0438 03/12/22 0746 03/12/22 1522  WBC 6.9  --   --   --  25.5*  --  24.3*  --  29.6*  29.4*  --   --   HGB 14.3   < >  --    < > 13.4   < > 12.0* 11.6* 12.6*  12.7* 12.2* 12.6*  HCT 42.8   < >  --    < > 38.5*   < > 35.4* 34.0* 36.5*  35.4* 36.0* 37.0*  PLT 30*  --  25*  --  28*  --  24*  --  25*  23*  --   --    < > = values in this interval not displayed.     COAGS: Recent Labs    04/09/21 1423 03/09/2022 1104 02/28/2022 1454 03/11/22 0336  INR 1.1 1.9* 1.7* 1.9*  APTT  --  42* 37*  --      BMP: Recent Labs    02/22/2022 1620 03/04/2022 2121 03/11/22 0336 03/11/22 0532 03/11/22 1600 03/11/22 1811 03/12/22 0438 03/12/22 0746 03/12/22 1522  NA 133*   < > 133*   < > 130* 127* 127* 124* 127*  K 3.0*   < > 4.7   < > 5.0 4.8 3.9 3.7 3.7  CL 96*  --  101  --  93*  --  91*  --   --   CO2 21*  --  12*  --  16*  --  18*  --   --   GLUCOSE 121*  --  149*  --  241*  --  262*  --   --   BUN 63*  --  68*  --  68*  --  73*  --   --   CALCIUM 8.4*  --  8.0*  --  6.5*  --  5.9*  --   --   CREATININE 4.95*  --  5.45*  --  5.89*  --  6.55*  --   --   GFRNONAA 12*  --  11*  --  10*  --  8*  --   --    < > = values in this interval not displayed.     LIVER FUNCTION TESTS: Recent Labs    07/16/21 1140 01/29/22 1504 03/03/2022 1104 03/11/22 0336  BILITOT 0.7 0.8 3.3* 3.8*  AST 23 19 95* 198*  ALT 26 27 45* 74*  ALKPHOS  --   --  520* 198*  PROT 7.2 7.1 5.0* 4.6*  ALBUMIN  --   --  2.2* 1.8*     Assessment and Plan:  Desiree Fleming is a 71  yo male  with septic shock likely secondary to acute cholecystitis s/p percutaneous cholecystostomy placement on 02/23/2022. Patient remains critically ill at this time.  Plan: Continue TID flushes with 5 cc NS. Record output Q shift. Dressing changes QD or PRN if soiled.  Call IR APP or on call IR MD if difficulty flushing or sudden change in drain output.  Repeat imaging/possible drain injection once output < 10 mL/QD (excluding flush material). Consideration for drain removal if output is < 10 mL/QD (excluding flush material), pending discussion with the providing surgical service.  Discharge planning: Please contact IR APP or on call IR MD prior to patient d/c to ensure appropriate follow up plans are in place. Typically patient will follow up with IR clinic 10-14 days post d/c for repeat imaging/possible drain injection. IR scheduler will contact patient with date/time of appointment. Patient will need to flush drain QD with 5 cc NS, record output QD, dressing changes every 2-3 days or earlier if soiled.  IR will continue to follow - please call with questions or concerns.  Electronically Signed: Lura Em, PA-C 03/12/2022, 4:01 PM   I spent a total of 15 Minutes at the the patient's bedside AND on the patient's hospital floor or unit, greater than 50% of which was counseling/coordinating care for acute cholecystitis s/p perc chole drain placement on 03/15/2022.

## 2022-03-13 DIAGNOSIS — Z7189 Other specified counseling: Secondary | ICD-10-CM | POA: Diagnosis not present

## 2022-03-13 DIAGNOSIS — Z515 Encounter for palliative care: Secondary | ICD-10-CM | POA: Diagnosis not present

## 2022-03-13 DIAGNOSIS — R6521 Severe sepsis with septic shock: Secondary | ICD-10-CM | POA: Diagnosis not present

## 2022-03-13 DIAGNOSIS — A419 Sepsis, unspecified organism: Secondary | ICD-10-CM | POA: Diagnosis not present

## 2022-03-13 LAB — COMPREHENSIVE METABOLIC PANEL
ALT: 3062 U/L — ABNORMAL HIGH (ref 0–44)
AST: 5877 U/L — ABNORMAL HIGH (ref 15–41)
Albumin: 1.6 g/dL — ABNORMAL LOW (ref 3.5–5.0)
Alkaline Phosphatase: 300 U/L — ABNORMAL HIGH (ref 38–126)
Anion gap: 14 (ref 5–15)
BUN: 47 mg/dL — ABNORMAL HIGH (ref 8–23)
CO2: 18 mmol/L — ABNORMAL LOW (ref 22–32)
Calcium: 6.4 mg/dL — CL (ref 8.9–10.3)
Chloride: 99 mmol/L (ref 98–111)
Creatinine, Ser: 4.17 mg/dL — ABNORMAL HIGH (ref 0.61–1.24)
GFR, Estimated: 15 mL/min — ABNORMAL LOW (ref >60)
Glucose, Bld: 136 mg/dL — ABNORMAL HIGH (ref 70–99)
Potassium: 4.8 mmol/L (ref 3.5–5.1)
Sodium: 131 mmol/L — ABNORMAL LOW (ref 135–145)
Total Bilirubin: 6.6 mg/dL — ABNORMAL HIGH (ref 0.3–1.2)
Total Protein: 3.9 g/dL — ABNORMAL LOW (ref 6.5–8.1)

## 2022-03-13 LAB — POCT I-STAT 7, (LYTES, BLD GAS, ICA,H+H)
Acid-base deficit: 6 mmol/L — ABNORMAL HIGH (ref 0.0–2.0)
Acid-base deficit: 5 mmol/L — ABNORMAL HIGH (ref 0.0–2.0)
Acid-base deficit: 7 mmol/L — ABNORMAL HIGH (ref 0.0–2.0)
Bicarbonate: 19.3 mmol/L — ABNORMAL LOW (ref 20.0–28.0)
Bicarbonate: 20.1 mmol/L (ref 20.0–28.0)
Bicarbonate: 17.1 mmol/L — ABNORMAL LOW (ref 20.0–28.0)
Calcium, Ion: 0.87 mmol/L — CL (ref 1.15–1.40)
Calcium, Ion: 1.01 mmol/L — ABNORMAL LOW (ref 1.15–1.40)
Calcium, Ion: 0.91 mmol/L — ABNORMAL LOW (ref 1.15–1.40)
HCT: 44 % (ref 39.0–52.0)
HCT: 42 % (ref 39.0–52.0)
HCT: 40 % (ref 39.0–52.0)
Hemoglobin: 15 g/dL (ref 13.0–17.0)
Hemoglobin: 14.3 g/dL (ref 13.0–17.0)
Hemoglobin: 13.6 g/dL (ref 13.0–17.0)
O2 Saturation: 97 %
O2 Saturation: 94 %
O2 Saturation: 94 %
Patient temperature: 97.4
Patient temperature: 98.1
Patient temperature: 99.6
Potassium: 4.8 mmol/L (ref 3.5–5.1)
Potassium: 4.7 mmol/L (ref 3.5–5.1)
Potassium: 4.8 mmol/L (ref 3.5–5.1)
Sodium: 129 mmol/L — ABNORMAL LOW (ref 135–145)
Sodium: 130 mmol/L — ABNORMAL LOW (ref 135–145)
Sodium: 130 mmol/L — ABNORMAL LOW (ref 135–145)
TCO2: 20 mmol/L — ABNORMAL LOW (ref 22–32)
TCO2: 21 mmol/L — ABNORMAL LOW (ref 22–32)
TCO2: 18 mmol/L — ABNORMAL LOW (ref 22–32)
pCO2 arterial: 35.5 mmHg (ref 32–48)
pCO2 arterial: 38.1 mmHg (ref 32–48)
pCO2 arterial: 32 mmHg (ref 32–48)
pH, Arterial: 7.341 — ABNORMAL LOW (ref 7.35–7.45)
pH, Arterial: 7.33 — ABNORMAL LOW (ref 7.35–7.45)
pH, Arterial: 7.338 — ABNORMAL LOW (ref 7.35–7.45)
pO2, Arterial: 90 mmHg (ref 83–108)
pO2, Arterial: 76 mmHg — ABNORMAL LOW (ref 83–108)
pO2, Arterial: 75 mmHg — ABNORMAL LOW (ref 83–108)

## 2022-03-13 LAB — BPAM PLATELET PHERESIS
Blood Product Expiration Date: 202309212359
ISSUE DATE / TIME: 202309211027
Unit Type and Rh: 6200

## 2022-03-13 LAB — LIPASE, BLOOD: Lipase: 133 U/L — ABNORMAL HIGH (ref 11–51)

## 2022-03-13 LAB — CBC
HCT: 40.9 % (ref 39.0–52.0)
Hemoglobin: 14.2 g/dL (ref 13.0–17.0)
MCH: 34.5 pg — ABNORMAL HIGH (ref 26.0–34.0)
MCHC: 34.7 g/dL (ref 30.0–36.0)
MCV: 99.5 fL (ref 80.0–100.0)
Platelets: 23 10*3/uL — CL (ref 150–400)
RBC: 4.11 MIL/uL — ABNORMAL LOW (ref 4.22–5.81)
RDW: 14.6 % (ref 11.5–15.5)
WBC: 28.8 10*3/uL — ABNORMAL HIGH (ref 4.0–10.5)
nRBC: 0 % (ref 0.0–0.2)

## 2022-03-13 LAB — LACTIC ACID, PLASMA
Lactic Acid, Venous: 5.5 mmol/L (ref 0.5–1.9)
Lactic Acid, Venous: 8.7 mmol/L (ref 0.5–1.9)

## 2022-03-13 LAB — PREPARE PLATELET PHERESIS: Unit division: 0

## 2022-03-13 LAB — CALCIUM, IONIZED: Calcium, Ionized, Serum: 3.4 mg/dL — ABNORMAL LOW (ref 4.5–5.6)

## 2022-03-13 LAB — PROTIME-INR
INR: 3.7 — ABNORMAL HIGH (ref 0.8–1.2)
Prothrombin Time: 36.3 seconds — ABNORMAL HIGH (ref 11.4–15.2)

## 2022-03-13 LAB — GLUCOSE, CAPILLARY
Glucose-Capillary: 131 mg/dL — ABNORMAL HIGH (ref 70–99)
Glucose-Capillary: 128 mg/dL — ABNORMAL HIGH (ref 70–99)
Glucose-Capillary: 107 mg/dL — ABNORMAL HIGH (ref 70–99)
Glucose-Capillary: 144 mg/dL — ABNORMAL HIGH (ref 70–99)

## 2022-03-13 LAB — MAGNESIUM: Magnesium: 2.5 mg/dL — ABNORMAL HIGH (ref 1.7–2.4)

## 2022-03-13 LAB — PHOSPHORUS: Phosphorus: 6.6 mg/dL — ABNORMAL HIGH (ref 2.5–4.6)

## 2022-03-13 MED ORDER — GLYCOPYRROLATE 0.2 MG/ML IJ SOLN
0.4000 mg | Freq: Four times a day (QID) | INTRAMUSCULAR | Status: DC
  Administered 2022-03-13 (×2): 0.4 mg via INTRAVENOUS
  Filled 2022-03-13 (×2): qty 2

## 2022-03-13 MED ORDER — INSULIN ASPART 100 UNIT/ML IJ SOLN: 0.0000 [IU] | INTRAMUSCULAR | Status: DC

## 2022-03-13 MED ORDER — ACETAMINOPHEN 650 MG RE SUPP: 650.0000 mg | Freq: Four times a day (QID) | RECTAL | Status: DC | PRN

## 2022-03-13 MED ORDER — GLYCOPYRROLATE 0.2 MG/ML IJ SOLN: 0.4000 mg | INTRAMUSCULAR | Status: DC | PRN

## 2022-03-13 MED ORDER — POLYVINYL ALCOHOL 1.4 % OP SOLN: 1.0000 [drp] | Freq: Four times a day (QID) | OPHTHALMIC | Status: DC | PRN

## 2022-03-13 MED ORDER — DEXMEDETOMIDINE HCL IN NACL 400 MCG/100ML IV SOLN
0.4000 ug/kg/h | INTRAVENOUS | Status: DC
  Administered 2022-03-13: 0.5 ug/kg/h via INTRAVENOUS

## 2022-03-13 MED ORDER — LORAZEPAM 2 MG/ML IJ SOLN
2.0000 mg | INTRAMUSCULAR | Status: DC | PRN
  Administered 2022-03-13: 2 mg via INTRAVENOUS
  Filled 2022-03-13: qty 1

## 2022-03-13 MED ORDER — HEPARIN SODIUM (PORCINE) 1000 UNIT/ML DIALYSIS
1000.0000 [IU] | INTRAMUSCULAR | Status: DC | PRN
  Administered 2022-03-13: 2200 [IU] via INTRAVENOUS_CENTRAL
  Filled 2022-03-13: qty 6

## 2022-03-13 MED ORDER — CALCIUM GLUCONATE-NACL 2-0.675 GM/100ML-% IV SOLN
2.0000 g | Freq: Once | INTRAVENOUS | Status: AC
  Administered 2022-03-13: 2000 mg via INTRAVENOUS
  Filled 2022-03-13: qty 100

## 2022-03-13 MED ORDER — BIOTENE DRY MOUTH MT LIQD: 15.0000 mL | OROMUCOSAL | Status: DC | PRN

## 2022-03-13 MED ORDER — ZINC CHLORIDE 1 MG/ML IV SOLN
INTRAVENOUS | Status: DC
  Filled 2022-03-13: qty 403.2

## 2022-03-13 MED ORDER — HYDROMORPHONE BOLUS VIA INFUSION: 1.0000 mg | INTRAVENOUS | Status: DC | PRN

## 2022-03-13 MED ORDER — SODIUM CHLORIDE 0.9 % IV SOLN
1.0000 mg/h | INTRAVENOUS | Status: DC
  Administered 2022-03-13: 2 mg/h via INTRAVENOUS
  Filled 2022-03-13 (×3): qty 2.5

## 2022-03-13 MED ORDER — ACETAMINOPHEN 325 MG PO TABS: 650.0000 mg | ORAL_TABLET | Freq: Four times a day (QID) | ORAL | Status: DC | PRN

## 2022-03-15 LAB — AEROBIC/ANAEROBIC CULTURE W GRAM STAIN (SURGICAL/DEEP WOUND)
Gram Stain: NONE SEEN
Special Requests: NORMAL

## 2022-03-15 LAB — CULTURE, BLOOD (ROUTINE X 2)
Culture: NO GROWTH
Culture: NO GROWTH
Special Requests: ADEQUATE

## 2022-03-22 NOTE — Progress Notes (Signed)
eLink Physician-Brief Progress Note Patient Name: Eric [name] DOB: 1950/10/22 MRN: [id number]   Date of Service  04/02/2022  HPI/Events of Note  Ca++ 0.87  eICU Interventions  Calcium gluconate 2 gm iv x 1 ordered.        Frederik Pear Apr 02, 2022, 6:19 AM

## 2022-03-22 NOTE — Progress Notes (Signed)
Nutrition Follow-up  DOCUMENTATION CODES:   Not applicable  INTERVENTION:   TPN management per pharmacy  NUTRITION DIAGNOSIS:   Inadequate oral intake related to inability to eat as evidenced by NPO status. - Ongoing  GOAL:   Patient will meet greater than or equal to 90% of their needs - Ongoing   MONITOR:   Vent status, Labs, I & O's  REASON FOR ASSESSMENT:   Ventilator    ASSESSMENT:   71 y.o. male presented to the ED with abdominal pain, nausea, and vomiting. PMH includes MDD, HLD, GERD, and EtOH abuse. Pt admitted with septic shock, cholecystitis, AKI, and hypoxia respiratory failure.   9/19 - Cholecystostomy by IR; intubated 9/21 - CRRT started 9/22 - TPN started  Discussed with RN at bedside. Plan for possible CT this afternoon. Still having significant output of OG. No family at bedside.  Plan to start TPN due to increased risk of ischemic bowel per surgery's note.   Patient is currently intubated on ventilator support. MV: 16 L/min MAP (a-line): 85 Temp (24hrs), Avg:98 F (36.7 C), Min:97.4 F (36.3 C), Max:99.6 F (37.6 C) Continuous Medications:  Precedex Levophed Vasopressin   Medications reviewed and include: NovoLog SSI, Protonix, IV antibiotics  Labs reviewed: Sodium 130, BUN 47, Creatinine 4.17, Phosphorus 6.6, Magnesium 2.5, Lactic Acid 5.5, 24 hr CBG 107-197  I/O's: +17.9 L since admit UOP: 21 mL x 24 hrs  OGT output: 1350 mL x 24 hrs CRRT: 3108 mL x 24 hours  Diet Order:   Diet Order             Diet NPO time specified  Diet effective now                   EDUCATION NEEDS:   Not appropriate for education at this time  Skin:  Skin Assessment: Reviewed RN Assessment  Last BM:  9/18  Height:  Ht Readings from Last 1 Encounters:  03/12/2022 6' (1.829 m)   Weight:  Wt Readings from Last 1 Encounters:  03/30/22 114.3 kg   Ideal Body Weight:  80.9 kg  BMI:  Body mass index is 34.18 kg/m.  Estimated Nutritional  Needs:  Kcal:  2200-2400 Protein:  125-150 grams Fluid:  >/= 2 L    Hermina Barters RD, LDN Clinical Dietitian See [facility] for contact information.

## 2022-03-22 NOTE — Progress Notes (Signed)
8m of HYDROmorphone (DILAUDID) 25 mg in sodium chloride 0.9 % 50 ml (0.5 mg/ml) infusion wasted in [facility] with FSylvan Cheese RN.

## 2022-03-22 NOTE — Progress Notes (Signed)
Pt extubated to comfort care.  

## 2022-03-22 NOTE — Progress Notes (Signed)
Subjective: Patient intubated, still critically ill and now back in afib with rvr, hypotension and reinitiation of pressors.  Wife and daughter at bedside   Objective: Vital signs in last 24 hours: Temp:  [97.4 F (36.3 C)-99.6 F (37.6 C)] 99.6 F (37.6 C) (09/22 1120) Pulse Rate:  [40-130] 130 (09/22 0930) Resp:  [7-25] 16 (09/22 0930) BP: (106)/(65) 106/65 (09/21 1937) SpO2:  [90 %-100 %] 93 % (09/22 0930) Arterial Line BP: (69-155)/(49-106) 91/72 (09/22 0930) FiO2 (%):  [40 %] 40 % (09/22 0915) Weight:  [114.3 kg] 114.3 kg (09/22 0500) Last BM Date : 03/09/22  Intake/Output from previous day: 09/21 0701 - 09/22 0700 In: 4948.5 [I.V.:3982.3; Blood:413.3; IV Piggyback:547.9] Out: 4599 [Urine:21; Emesis/NG output:1350; Drains:120] Intake/Output this shift: Total I/O In: 483.7 [I.V.:219.6; IV Piggyback:264.1] Out: 617   PE: Gen: critically ill on vent Lungs: vented Abd: soft, perc chole drain in place with bilious output.  OGT in place with dark brown output Ext: mottled in BLE up to mid thighs as well as scrotum and penis.  Unable to palpate pedal pulses.  B hand with some mottling as well.    Lab Results:  Recent Labs    03/12/22 0438 03/12/22 0746 2022-04-08 0525 2022-04-08 0823  WBC 29.6*  29.4*  --  28.8*  --   HGB 12.6*  12.7*   < > 14.2 14.3  HCT 36.5*  35.4*   < > 40.9 42.0  PLT 25*  23*  --  23*  --    < > = values in this interval not displayed.   BMET Recent Labs    03/12/22 1513 03/12/22 1522 04/08/2022 0525 04-08-22 0823  NA 130*   < > 131* 130*  K 3.8   < > 4.8 4.7  CL 94*  --  99  --   CO2 20*  --  18*  --   GLUCOSE 193*  --  136*  --   BUN 66*  --  47*  --   CREATININE 5.36*  --  4.17*  --   CALCIUM 5.6*  --  6.4*  --    < > = values in this interval not displayed.   PT/INR Recent Labs    03/07/2022 1454 03/11/22 0336  LABPROT 20.0* 21.9*  INR 1.7* 1.9*   CMP     Component Value Date/Time   NA 130 (L) Apr 08, 2022 0823   K  4.7 04-08-2022 0823   CL 99 04-08-2022 0525   CO2 18 (L) April 08, 2022 0525   GLUCOSE 136 (H) 04/08/2022 0525   BUN 47 (H) 04/08/22 0525   CREATININE 4.17 (H) 04-08-2022 0525   CREATININE 1.18 01/29/2022 1504   CALCIUM 6.4 (LL) 08-Apr-2022 0525   PROT 3.9 (L) 08-Apr-2022 0525   ALBUMIN 1.6 (L) Apr 08, 2022 0525   AST 5,877 (H) 04/08/2022 0525   ALT 3,062 (H) 04-08-2022 0525   ALKPHOS 300 (H) 04-08-2022 0525   BILITOT 6.6 (H) 04-08-2022 0525   GFRNONAA 15 (L) 08-Apr-2022 0525   GFRNONAA 80 09/12/2020 1247   GFRAA 92 09/12/2020 1247   Lipase     Component Value Date/Time   LIPASE 167 (H) 03/12/2022 0956       Studies/Results: DG CHEST PORT 1 VIEW  Result Date: 03/12/2022 CLINICAL DATA:  Respiratory failure. EXAM: PORTABLE CHEST 1 VIEW COMPARISON:  Radiographs 03/11/2022 and 03/14/2022.  CT 03/21/2022. FINDINGS: 1227 hours. Two views obtained. Endotracheal tube, bilateral internal jugular central venous catheters and an  enteric tube are unchanged in position. The heart size and mediastinal contours are stable. Patchy left basilar airspace disease appears slightly improved. The right lung appears clear for the degree of inspiration. No significant pleural effusion or pneumothorax. Telemetry leads overlie the chest. IMPRESSION: Interval partial clearing of left lower lobe airspace disease. Stable support system. Electronically Signed   By: Richardean Sale M.D.   On: 03/12/2022 13:12   DG CHEST PORT 1 VIEW  Result Date: 03/11/2022 CLINICAL DATA:  Encounter for central line. EXAM: PORTABLE CHEST 1 VIEW COMPARISON:  Chest x-ray from the same day. FINDINGS: Right IJ approach central venous catheter with tip projecting at the lower SVC. No visible pneumothorax. Endotracheal tube tip at the level of the clavicular heads. Gastric tube courses below the diaphragm. Mild interstitial prominence. Mild bibasilar opacities IMPRESSION: 1. Right IJ approach central venous catheter with tip projecting at the  lower SVC. No visible pneumothorax. 2. Mild interstitial opacities, which could represent interstitial pulmonary edema versus the sequela of recurrent bouts of CHF. 3. Mild bibasilar opacities, which could represent atelectasis and/or consolidation. Electronically Signed   By: Margaretha Sheffield M.D.   On: 03/11/2022 15:07    Anti-infectives: Anti-infectives (From admission, onward)    Start     Dose/Rate Route Frequency Ordered Stop   03/12/22 1000  ceFEPIme (MAXIPIME) 2 g in sodium chloride 0.9 % 100 mL IVPB        2 g 200 mL/hr over 30 Minutes Intravenous Every 12 hours 03/12/22 0752     03/11/22 1100  ceFEPIme (MAXIPIME) 2 g in sodium chloride 0.9 % 100 mL IVPB  Status:  Discontinued        2 g 200 mL/hr over 30 Minutes Intravenous Every 24 hours 03/18/2022 1327 03/12/22 0752   03/18/2022 2000  metroNIDAZOLE (FLAGYL) IVPB 500 mg        500 mg 100 mL/hr over 60 Minutes Intravenous Every 12 hours 03/06/2022 1543     03/11/2022 1322  vancomycin variable dose per unstable renal function (pharmacist dosing)  Status:  Discontinued         Does not apply See admin instructions 03/16/2022 1327 03/11/22 1658   03/20/2022 1200  vancomycin (VANCOREADY) IVPB 2000 mg/400 mL        2,000 mg 200 mL/hr over 120 Minutes Intravenous  Once 03/12/2022 1130 03/07/2022 1412   02/22/2022 1115  ceFEPIme (MAXIPIME) 2 g in sodium chloride 0.9 % 100 mL IVPB        2 g 200 mL/hr over 30 Minutes Intravenous  Once 02/26/2022 1108 02/21/2022 1200   03/15/2022 1115  metroNIDAZOLE (FLAGYL) IVPB 500 mg        500 mg 100 mL/hr over 60 Minutes Intravenous  Once 03/11/2022 1108 03/17/2022 1315   02/22/2022 1115  vancomycin (VANCOCIN) IVPB 1000 mg/200 mL premix  Status:  Discontinued        1,000 mg 200 mL/hr over 60 Minutes Intravenous  Once 02/26/2022 1108 02/27/2022 1129        Assessment/Plan Severe sepsis secondary to Acute cholecystitis with likely Liver abscess, ileus -perc chole placed by IR 9/19, appreciate their assistance. -persistent  sepsis  -cont abx therapy -LFTs significantly elevated most c/w shock liver -WBC up to 28K today, t max 100 overnight - given ileus would not recommend initiation of enteral feeds and while he is on pressor requirements.  Risk of ischemic bowel increased.  May have to consider TNA for now. -discussed surgical issues with wife and daughter at  bedside and primary team on the unit -they plan to repeat a CT scan of the abd/pel today if he is stable enough to go down.  Patient would not tolerate an operation if something catastrophic found on CT scan, wife, daughter, and primary team understand this. -palliative care has been consulted.    FEN: NPO/IVFs/pressor ID: [email or web address] VTE: none given low platelets   A fib RVR - s/p cardioversion.  on amio, per primary ARF - essentially oliguric - foley in place, rising Cr, to start CRRT today DIC - defer to primary Supra-therapeutic INR - secondary to sepsis Thrombocytopenia - secondary to sepsis HLD Hypokalemia - resolved Elevated LFTs - secondary to above, monitor VDRF - per primary  I reviewed nursing notes, Consultant nephrology notes, hospitalist notes, last 24 h vitals and pain scores, last 48 h intake and output, last 24 h labs and trends, and last 24 h imaging results.   LOS: 3 days    Henreitta Cea , [facility] Mar 25, 2022, 11:37 AM Please see Amion for pager number during day hours 7:00am-4:30pm or 7:00am -11:30am on weekends

## 2022-03-22 NOTE — Progress Notes (Deleted)
NAME:  Eric [name], MRN:  [id number], DOB:  [birth year], LOS: 3 ADMISSION DATE:  02/22/2022,  Attending  MD:  Freda Jackson, CHIEF COMPLAINT:  Septic shock secondary to cholecystitis s/p VIR Cholecystostomy   History of Present Illness:  71 year old male with PMH of HLD, labile HTN, remote smoking history who presented from home with abdominal pain.  Patient reports he had sudden onset of abdominal pain with nausea and vomiting on 9/14 in which he attributed to food poisoning.  Has only been able to tolerate liquids, saltine crackers, and some protein shakes over the weekend.  Tried eating soup yesterday and vomited that up with one episode of diarrhea and had chills.  Denies fevers, SOB, or chest pain.  Has abrasion to left knee, patient does not remember how he obtained that.  Reports not voiding as normal.  Given worsening abdominal pain, he presented to ER.    Arrived in ER in Afib with RVR and normotensive initially.  Was placed on esmolol for rate control then became hypotensive requiring emergent cardioversion back to ST.  Ongoing hypotension despite 3L IVF, therefore started on norepinephrine.    Workup in ER noted for K 2.8, sCr 4.86 (previously 1.18 in 01/2022), bicarb 14, iCa 1.07, alk phos 520, AST/ ALT 95/45, t. Bili 3.3, trop hs 821, WBC 6.9, Hgb 14.3, plts 30, lactic acid > 9 with repeat 8.9.  CTA PE was neg for PE with no acute abnormalities in the chest, CTH neg, and CT a/p w/contrast concerning for acute cholecystitis and liver lesion adjacent to the gallbladder which could represent hepatic abscess, complex cyst, or neoplasm.  RUQ US showed gallbladder thickening and pericholecystic fluid, positive Murphy's sign suggestive of acalculous cholecystitis, mildly complex fluid collection adjacent to gallbladder seen again.  Cultures sent and started on empiric abx.  Treated with calcium, bicarb push then drip, and KCL.  Surgery and IR were consulted and IR performed a percutaneous  cholecystostomy with drain in place.   Pertinent  Medical History  HLD Labile HTN Emphysema of the lung Hx of Basal Cell Cancer Remote but significant smoking Hx Depression  Consults  Nephrology Palliative   Significant Hospital Events: Including procedures, antibiotic start and stop dates in addition to other pertinent events   9/19: Arterial line placed, IR percutaneous cholecystostomy, CVC catheter placement left IJV, intubation for respiratory failure 9/20 Right IJV HD catheter placed 9/21 CRRT initiated  Interim History / Subjective:  Overnight patient was progressively bradycardic despite cessation of amiodarone gtt., and Precedex.  Patient was bradycardic until 2 AM, which point patient's heart rate improved with simultaneous decrease in Levophed support.  Patient was weaned off of Levophed at this time; however, patient's maps began to decrease at approximately 5 AM and Levophed was restarted and increased to 17 mcg/minute.  Patient was stable at this dose for the duration of the shift.  Additionally, patient was hypocalcemic, and was repleted with 1 g of calcium gluconate at shift change and then additionally repleted in approximately 6 AM with 2 g of calcium gluconate.  Nursing notes that bicarb was not paused until shift change last night.  CRRT was tolerated overnight without issue.  Objective   Blood pressure 106/65, pulse 86, temperature 99.6 F (37.6 C), temperature source Axillary, resp. rate (!) 5, height 6' (1.829 m), weight 114.3 kg, SpO2 (!) 89 %. CVP:  [8 mmHg-12 mmHg] 12 mmHg  Vent Mode: PRVC FiO2 (%):  [40 %] 40 % Set Rate:  [20  bmp-24 bmp] 20 bmp Vt Set:  [620 mL] 620 mL PEEP:  [5 cmH20-8 cmH20] 5 cmH20 Pressure Support:  [5 cmH20] 5 cmH20 Plateau Pressure:  [16 cmH20-23 cmH20] 16 cmH20   Intake/Output Summary (Last 24 hours) at 19-Mar-2022 1203 Last data filed at 03-19-22 1100 Gross per 24 hour  Intake 3944.41 ml  Output 4320 ml  Net -375.59 ml     Filed Weights   03/16/2022 1115 03/12/22 0449 03-19-22 0500  Weight: 95.3 kg 118.4 kg 114.3 kg    Examination: General: Critically ill appearing, intubated and sedated and lying in bed HENT: CVC present in left IJV. HD catheter present in right IJV.  Lungs: Clear to ausculation in lung fields, breathing with ventilator. Cardiovascular: Regular rate and rhythm, S1 and S2 appreciated, but difficult to hear over ventilator. no obvious murmurs rubs or gallops Abdomen: Percutaneous Drain present in RUQ.has pain, and still draining dark yellow purulent drainage.  Abdomen is soft, nontender, nondistended Extremities: Severely mottled bilateral upper and  extremities, without dorsalis pedis/posterior tibialpulses auscultated bilaterally (doppler). Mottling is present to mid thigh and includes penis. Hands/finger nails are dusky.  Neuro: Patient opens his eyes to pain, and groans with pain.  He opens his eyes spontaneously as well, but does not follow commands or track. GU: Foley in place, significant scrotal edema still in place with overlying erythema/mottling.  Edema is stable since yesterday, scrotal sling in place.  Resolved Hospital Problem list     Assessment & Plan:  Septic shock  2/2  acute acalculous cholecystitis s/p IR perc. Cholecystostomy Patient is critically ill. Current pressor regimen of Levophed and Vasopressin, with stable MAPs. As of today, patient distal pulses can no longer be auscultated via Doppler.  Percutaneous drain continues to be patent, and has drained under 120 mL over the past 24 hours.  Lactate at 4 from 3.7.  Most recent ABG with a pH of 7.33 from 7.341, PCO2 of 38.1 from 35.5, and a bicarb of 20.1 from 19.3.  Currently on Day 4 of Cefepime and Flagyl, Vanc was dc'ed 2/20.  Perc Drain culture has grown E.coli, but abx susceptibility is still pending.  Blood cultures negative at 48 hours, 72-hour check pending.  Most recent lipase of 167 - Continue NE and Vaso, with  MAP goal 65-75, titrate as possible - follow cultures, susceptibility - Continue cefepime, and flagyl - Daily CBC - Rechecking lipase  - Trend lactate - Surgery following, appreciate recs - Consulted palliative care, appreciate recs   Anuric AKI  Hypokalemia  AGMA/ lactic acidosis AKI likely pre-renal from poor PO intake, hypotension no prior history of renal disease.  Initial UA with mod Hgb, no RBC, 100 protein, neg leuk/ nitrates, follow UC. S/p 2 runs of KCL in ER. No hydronephrosis/ blockage noted on CT a/p.  As of today, trace edema on exam, patient is still anuric with only 21 cc of urine output over the past 24 hours.  Lactate of 4 from 3.7.  Morning ABG with a pH of 7.33.  CRRT was initiated yesterday without incidence, patient was kept net even.   -Continue CRRT, with goal to take volume off today per nephro recommendations - Nephrology following, appreciate continued recs - strict I/Os - avoid further nephrotoxins  - Continue foley  Significant Transaminitis Patient was originally admitted with elevated liver enzymes, with initial values of an AST at 95, ALT at 45, and alk phos at 520.  A possible hepatic cyst vs abscess was identified on initial  imaging, but during IR procedure, there was no identifiable abscess to drain.  As of today, there is been a significant increase in the liver enzymes.  AST is now at 5877 and ALT is at 3062 with the most recent alk phos at 300.  -We will order CT abdomen pelvis without contrast to assess for presence of intra-abdominal pathology.  Ordered without contrast per nephro recs given ongoing anuric AKI. - Consider hepatitis panel pending CT A/P  Hypocalcemia Patient continues to be hypocalcemic, with ongoing repletion necessary.  Patient is not using citrate is medium for CRRT.  Again likely secondary to AKI.  - will continue to monitor and replete as necessary. -Continue CRRT   Hypoxia respiratory failure  Patient was intubated given  respiratory failure in the context of septic shock. Initial CXR and CTA PE neg for acute process. As of today, breathing with vent.  On fentanyl gtt 71mcg/min and precedex for sedation. Current Vent settings:PRVC: 30% Fio2, 24 RR, Vt set: 450 mL, PEEP 5cmh2o,.  As of today patient has tolerated  trials of PS/CPAP well, and been able to pull significant tidal volumes without issue.  Lung exam continues to demonstrate lungs are clear to auscultation bilaterally, notably without crackles or wheezes wheezes.  -Continue trials of PS/CPAP   New onset Afib with RVR  Elevated hs troponin  Bradycardia New onset Afib with no prior hx, s/p cardioversion in ER. Amiodarone was started at time of presentation and downtrend to 30 mg/minute.  Elevated troponin likely secondary to demand in context of shock, peak of 886. Formal echo demonstrated normal LVEF with no wall motion abnormalities as well as normal RV systolic function.  As of today, patient was progressively bradycardic throughout the night, and amiodarone was stopped. - ideally goal K> 4, Mag> 2   Coagulapathy, elevated INR Thrombocytopenia Initial DIC panel wnl.  Previous d-dimer was elevated and fibrinogen WNL. No schistocytes seen on initial smear.  As of today platelets at 23 from 28, HgB at 14.2 from 12.7.  Clinical picture not consistent with DIC, coagulopathy likely related to sepsis.  -Rechecking PT/INR  Nutrition Patient is reportedly been without food since time of initial symptom onset which was 9/14.  Given concern for mesenteric ischemia in the context of ongoing need for pressors, and possibility of intra-abdominal pathology, will proceed with TPN over enteral nutrition at this time. -Start TPN, with protein concentrated formula to minimize fluid input    HLD - hold crestor with transaminitis   Best Practice (right click and "Reselect all SmartList Selections" daily)   Diet/type: Will assess tomorrow DVT prophylaxis: SCD, GI  prophylaxis: PPI Lines: Central line, Dialysis Catheter, Arterial Line, and yes and it is still needed Foley:  Yes, and it is still needed Code Status:  full code Last date of multidisciplinary goals of care discussion [9/19,9/20] Wife, Son-in-law at bedside, updated on plan of care  Labs   CBC: Recent Labs  Lab 02/27/2022 1104 03/18/2022 1128 03/06/2022 1454 03/05/2022 2121 03/11/22 0336 03/11/22 0532 03/11/22 1607 03/11/22 1811 03/12/22 0438 03/12/22 0746 03/12/22 1522 03/12/22 1627 03-21-2022 0253 03/21/22 0525 2022/03/21 0823  WBC 6.9  --   --   --  25.5*  --  24.3*  --  29.6*  29.4*  --   --   --   --  28.8*  --   NEUTROABS 4.8  --   --   --   --   --   --   --  26.2*  --   --   --   --   --   --  HGB 14.3   < >  --    < > 13.4   < > 12.0*   < > 12.6*  12.7*   < > 12.6* 12.2* 15.0 14.2 14.3  HCT 42.8   < >  --    < > 38.5*   < > 35.4*   < > 36.5*  35.4*   < > 37.0* 36.0* 44.0 40.9 42.0  MCV 102.1*  --   --   --  100.0  --  100.9*  --  98.6  96.7  --   --   --   --  99.5  --   PLT 30*  --  25*  --  28*  --  24*  --  25*  23*  --   --   --   --  23*  --    < > = values in this interval not displayed.     Basic Metabolic Panel: Recent Labs  Lab 03/21/2022 1236 03/12/2022 1620 03/11/22 0336 03/11/22 0532 03/11/22 1600 03/11/22 1811 03/12/22 0438 03/12/22 0746 03/12/22 1513 03/12/22 1522 03/12/22 1627 Apr 01, 2022 0253 Apr 01, 2022 0525 Apr 01, 2022 0823  NA  --    < > 133*   < > 130*   < > 127*   < > 130* 127* 129* 129* 131* 130*  K  --    < > 4.7   < > 5.0   < > 3.9   < > 3.8 3.7 3.7 4.8 4.8 4.7  CL  --    < > 101  --  93*  --  91*  --  94*  --   --   --  99  --   CO2  --    < > 12*  --  16*  --  18*  --  20*  --   --   --  18*  --   GLUCOSE  --    < > 149*  --  241*  --  262*  --  193*  --   --   --  136*  --   BUN  --    < > 68*  --  68*  --  73*  --  66*  --   --   --  47*  --   CREATININE  --    < > 5.45*  --  5.89*  --  6.55*  --  5.36*  --   --   --  4.17*  --   CALCIUM   --    < > 8.0*  --  6.5*  --  5.9*  --  5.6*  --   --   --  6.4*  --   MG 1.9  --  2.5*  --   --   --   --   --   --   --   --   --  2.5*  --   PHOS  --   --   --   --   --   --   --   --  5.4*  --   --   --  6.6*  --    < > = values in this interval not displayed.    GFR: Estimated Creatinine Clearance: 21.2 mL/min (A) (by C-G formula based on SCr of 4.17 mg/dL (H)). Recent Labs  Lab 03/11/22 2956 03/11/22 2130 03/11/22 1607 03/11/22 2050 03/12/22 8657 03/12/22 8469 03/12/22 6295 03/12/22 0956 03/12/22 1502 Apr 01, 2022  0525 March 14, 2022 0742  WBC 25.5*  --  24.3*  --  29.6*  29.4*  --   --   --   --  28.8*  --   LATICACIDVEN  --    < >  --    < >  --    < > 4.0* 3.7* 4.0*  --  5.5*   < > = values in this interval not displayed.     Liver Function Tests: Recent Labs  Lab 03/08/2022 1104 03/11/22 0336 03/12/22 1513 03-14-22 0525  AST 95* 198*  --  5,877*  ALT 45* 74*  --  3,062*  ALKPHOS 520* 198*  --  300*  BILITOT 3.3* 3.8*  --  6.6*  PROT 5.0* 4.6*  --  3.9*  ALBUMIN 2.2* 1.8* <1.5* 1.6*    Recent Labs  Lab 03/11/22 0336 03/12/22 0956  LIPASE 26 167*    No results for input(s): "AMMONIA" in the last 168 hours.  ABG    Component Value Date/Time   PHART 7.330 (L) 03-14-2022 0823   PCO2ART 38.1 March 14, 2022 0823   PO2ART 76 (L) 03/14/22 0823   HCO3 20.1 03/14/2022 0823   TCO2 21 (L) 03-14-22 0823   ACIDBASEDEF 5.0 (H) 03/14/2022 0823   O2SAT 94 2022/03/14 0823     Coagulation Profile: Recent Labs  Lab 03/14/2022 1104 03/05/2022 1454 03/11/22 0336  INR 1.9* 1.7* 1.9*     Cardiac Enzymes: No results for input(s): "CKTOTAL", "CKMB", "CKMBINDEX", "TROPONINI" in the last 168 hours.  HbA1C: Hgb A1c MFr Bld  Date/Time Value Ref Range Status  01/29/2022 03:04 PM 5.6 <5.7 % of total Hgb Final    Comment:    For the purpose of screening for the presence of diabetes: . <5.7%       Consistent with the absence of diabetes 5.7-6.4%    Consistent with  increased risk for diabetes             (prediabetes) > or =6.5%  Consistent with diabetes . This assay result is consistent with a decreased risk of diabetes. . Currently, no consensus exists regarding use of hemoglobin A1c for diagnosis of diabetes in children. . According to American Diabetes Association (ADA) guidelines, hemoglobin A1c <7.0% represents optimal control in non-pregnant diabetic patients. Different metrics may apply to specific patient populations.  Standards of Medical Care in Diabetes(ADA). Marland Kitchen   04/30/2021 11:02 AM 5.3 4.8 - 5.6 % Final    Comment:    (NOTE) Pre diabetes:          5.7%-6.4%  Diabetes:              >6.4%  Glycemic control for   <7.0% adults with diabetes     CBG: Recent Labs  Lab 03/12/22 1948 03/12/22 2330 14-Mar-2022 0329 03/14/2022 0748 Mar 14, 2022 1132  GLUCAP 197* 157* 131* 128* 107*    Past Medical History:  He,  has a past medical history of Arteriosclerosis of aorta (HCC), Arthritis, Basal cell carcinoma, GERD (gastroesophageal reflux disease), Hyperlipidemia, Labile hypertension, OA (osteoarthritis) of knee (11/25/2020), Other testicular hypofunction, Prediabetes, and Vitamin D deficiency.   Surgical History:   Past Surgical History:  Procedure Laterality Date   CATARACT EXTRACTION, BILATERAL Bilateral 2019   Dr. Altamese Dilling   dental implant surgery      IR PERC CHOLECYSTOSTOMY  03/12/2022   KNEE ARTHROSCOPY Left 1988   SKIN CANCER EXCISION     Ear, forehead, nose   TOTAL KNEE ARTHROPLASTY Left 11/25/2020  Procedure: TOTAL KNEE ARTHROPLASTY;  Surgeon: Gaynelle Arabian, MD;  Location: [facility];  Service: Orthopedics;  Laterality: Left;  12min   TOTAL KNEE ARTHROPLASTY Right 05/05/2021   Procedure: TOTAL KNEE ARTHROPLASTY;  Surgeon: Gaynelle Arabian, MD;  Location: [facility];  Service: Orthopedics;  Laterality: Right;     Social History:   reports that he quit smoking about 17 years ago. His smoking use included cigarettes. He  started smoking about 53 years ago. He has a 55.50 pack-year smoking history. He has never used smokeless tobacco. He reports current alcohol use of about 2.0 - 3.0 standard drinks of alcohol per week. He reports that he does not use drugs.   Family History:  His family history includes Alzheimer's disease in his mother; Colon polyps in his father; Diabetes in his mother; Heart disease in his father; Hypertension in his father; Stroke in his father.   Allergies Allergies  Allergen Reactions   Lipitor [Atorvastatin] Other (See Comments)    Cloudy headed    Lopid [Gemfibrozil] Other (See Comments)    Unknown reaction. Patient doesn't remember taking this medication.   Penicillins Other (See Comments)    Unknown childhood reaction Tolerated Cephalosporin Date: 11/26/20. Tolerated Cephalosporin Date: 05/06/21.       Home Medications  Prior to Admission medications   Medication Sig Start Date End Date Taking? Authorizing Provider  calcium carbonate (TUMS - DOSED IN MG ELEMENTAL CALCIUM) 500 MG chewable tablet Chew 1,000 mg by mouth daily as needed for indigestion or heartburn.   Yes [provider]  citalopram (CELEXA) 20 MG tablet Take 1 tablet (20 mg total) by mouth daily. 07/16/21 07/16/22 Yes Liane Comber, NP  Cyanocobalamin (B-12 PO) Take 1 capsule by mouth daily.   Yes [provider]  ibuprofen (ADVIL) 200 MG tablet Take 400 mg by mouth every 4 (four) hours as needed for moderate pain.   Yes [provider]  rosuvastatin (CRESTOR) 5 MG tablet TAKE 1 TABLET BY MOUTH DAILY FOR CHOLESTEROL AND PLAQUE ON ARTERIES Patient taking differently: Take 5 mg by mouth daily. 12/02/21  Yes Alycia Rossetti, NP  VITAMIN D, CHOLECALCIFEROL, PO Take 10,000 Units by mouth daily.   Yes [provider]     Critical care time:

## 2022-03-22 NOTE — TOC Progression Note (Signed)
Transition of Care [facility]) - Progression Note    Patient Details  Name: Eric [name] MRN: [id number] Date of Birth: 05/30/1951  [facility] CM/SW Contact  Tom-Johnson, Renea Ee, RN Phone Number: 2022/03/29, 1:41 PM  Clinical Narrative:     Patient continues to be intubated. CRRT initiated on 03/12/22, responding well. Continues on IV abx, drips. Palliative care consult.  CM will continue to follow with need as patient progresses with care.     Barriers to Discharge: Continued Medical Work up  Expected Discharge Plan and Services     Discharge Planning Services: CM Consult   Living arrangements for the past 2 months: Single Family Home                                       Social Determinants of Health (SDOH) Interventions    Readmission Risk Interventions     No data to display

## 2022-03-22 NOTE — Progress Notes (Signed)
Pharmacy Antibiotic Note  Eric [name] is a 71 y.o. male admitted on 03/02/2022 with  septic shock 2/2 acute cholecystitis, possible liver abscess .  Pharmacy has been consulted for Cefepime dosing.  Over the last 24 hours, patient has remained afebrile, WBC 28.8, LA worsened 4 > 5.5. Patient is currently intubated with fluctuating vasopressor requirements, urine output has been minimal. CRRT was initiated on 9/21, effluent rate is at 20 today. S/p percutaneous cholecystostomy on 9/19, drain culture growing e. Coli (pan sens). Concern for liver abscess, repeat CT scan scheduled today before de-escalating antibiotics.  Plan: Continue cefepime 2 g IV Q12hr Continue metronidazole 500 mg IV Q12hr F/u CT scan for de-escalation and LOT Monitor CRRT effluent rate for dose adjustments Monitor clinical status  Height: 6' (182.9 cm) Weight: 114.3 kg (251 lb 15.8 oz) IBW/kg (Calculated) : 77.6  Temp (24hrs), Avg:97.7 F (36.5 C), Min:97.4 F (36.3 C), Max:98.1 F (36.7 C)  Recent Labs  Lab 03/05/2022 1104 03/17/2022 1128 03/11/22 0336 03/11/22 0739 03/11/22 1600 03/11/22 1607 03/11/22 2050 03/12/22 0438 03/12/22 0447 03/12/22 0742 03/12/22 0956 03/12/22 1502 03/12/22 1513 03-15-22 0525 03/15/22 0742  WBC 6.9  --  25.5*  --   --  24.3*  --  29.6*  29.4*  --   --   --   --   --  28.8*  --   CREATININE 4.86*   < > 5.45*  --  5.89*  --   --  6.55*  --   --   --   --  5.36* 4.17*  --   LATICACIDVEN >9.0*   < >  --    < > 8.4*  --    < >  --  4.4* 4.0* 3.7* 4.0*  --   --  5.5*   < > = values in this interval not displayed.     Estimated Creatinine Clearance: 21.2 mL/min (A) (by C-G formula based on SCr of 4.17 mg/dL (H)).    Allergies  Allergen Reactions   Lipitor [Atorvastatin] Other (See Comments)    Cloudy headed    Lopid [Gemfibrozil] Other (See Comments)    Unknown reaction. Patient doesn't remember taking this medication.   Penicillins Other (See Comments)    Unknown  childhood reaction Tolerated Cephalosporin Date: 11/26/20. Tolerated Cephalosporin Date: 05/06/21.      Antimicrobials this admission: Cefepime 9/19 >>  Vancomycin 9/19 x1 Metronidazole 9/19 >>   Dose adjustments this admission: 9/21 - cefepime increased to 2g IV Q12hrs after CRRT initiated  Microbiology results: 9/19 BCx: NGTD 9/19 UCx: NGTD 9/19 Drain cx: e. Coli (pan sens)  Thank you for allowing pharmacy to be a part of this patient's care.  Louanne Belton, PharmD, [facility] PGY1 Pharmacy Resident 2022/03/15 11:10 AM

## 2022-03-22 NOTE — Progress Notes (Addendum)
PHARMACY - TOTAL PARENTERAL NUTRITION CONSULT NOTE   Indication: Extended Time Frame NPO secondary to IAI (acute cholecystitis and likely liver abscess)   Patient Measurements: Height: 6' (182.9 cm) Weight: 114.3 kg (251 lb 15.8 oz) IBW/kg (Calculated) : 77.6 TPN AdjBW (KG): 95.3 Body mass index is 34.18 kg/m. Usual Weight: 95.3kg   Assessment: Patient is a 71 year old male admitted for septic shock secondary to acute cholecystitis and likely liver abscess. Patient admitted on 9/19 and has been NPO since admission. Prior to admission, patient had poor oral intake for approximately 5 days. Concern for further intra-abdominal process, repeat CT abdomen today.   Patient has fluctuating pressor requirements, currently on norepinephrine 1 mcg and vasopressin. 0.03 units/hr. Norepinephrine has been as high as 26mg within past 12 hours.   TPN per CCM in the setting of prolonged NPO timeframe, intermittent vasopressor requirements (per surgery, recommend to not start enteral nutrition due to risk for mesenteric ischemia), and concern for evolving intra-abdominal process.   Glucose / Insulin: Sensitive SSI 12u/24h, > 200 x4-5, on stress dose steroids for septic shock  Electrolytes: Ionized calcium: 1.01 (low, has relieved 5g of calcium gluconate in the past 24h), Na: 130 (low), HCO3: 18 (low, was on bicarb gtt overnight), phos: 6.6 (high), mag: 2.5 (high)  Renal: CRRT Hepatic: Shock Liver, AST/ALT elevated, T-bili: 6.6, AST/ALT: 5877/3062  Intake / Output; MIVF: no MVIF, 9/21 net positive 633.219m -211mrine output, -1350 NG output, -3L CRRT, -120m52main output  GI Imaging: 9/19: CT abdomen: cholecystitis, irregular low density lesion within liver, potential abscess  GI Surgeries / Procedures:  9/19: Perc chole drain   Central access: CVC triple lumen  TPN start date: 9/22   Nutritional Goals: Goal TPN rate is 85 mL/hr (provides 129 g of protein and 2600 kcals per day)  RD  Assessment: Estimated Needs Total Energy Estimated Needs: 2000-2200 Total Protein Estimated Needs: 100-120 grams Total Fluid Estimated Needs: >/= 2 L Follow up for new dietary assessment on 9/23, reached out to RD to adjust needs based on CRRT and new onset liver dysfunction    Current Nutrition:  NPO  TPN starting 9/22   Plan:  Start TPN at 40mL69mat 1800, provides 1226kcal and 61g of protein.  Electrolytes in TPN: Na 50mEq82mK 0mEq/L47ma 10 mEq/L, Mg 0mEq/L,71md Phos 0mmol/L.38mximize acetate as patient's HCO is currently 18 and required bicarb drip overnight. May need to switch to 1:2 on 9/23.  Add standard MVI, remove trace elements due to T-Bili > 6. Added back selenium and zinc, held chromium in the setting of renal failure.  Increase SSI to moderate q4h and adjust as needed, patient has required 12 units SSI in the past 24 hours.  Concentrated TPN in the setting of CRRT and fluid overload. Discussed with CCM.  Monitor TPN labs daily while liver dysfunction and on CRRT   Eric [name] [place] [id number]

## 2022-03-22 NOTE — Progress Notes (Signed)
Subjective:   still requiring high dose pressors-  minimal UOP -   has not improved much overnight, LFTs are way up this AM -  platelets 24K -  started CRRT 9/21 -  machine is running well   Objective Vital signs in last 24 hours: Vitals:   03/14/2022 0645 2022/03/14 0700 2022-03-14 0715 14-Mar-2022 0751  BP:      Pulse: 78 78 81   Resp: '20 20 20   '$ Temp:    98.1 F (36.7 C)  TempSrc:    Axillary  SpO2: 95% 95% 95%   Weight:      Height:       Weight change: -4.1 kg  Intake/Output Summary (Last 24 hours) at 2022/03/14 0837 Last data filed at March 14, 2022 0800 Gross per 24 hour  Intake 4789.96 ml  Output 4579 ml  Net 210.96 ml     Assessment/Plan: 71 year old WM-  pretty healthy at baseline-  presents with sepsis secondary to cholecystitis  complicated by AKI 1.Renal- baseline crt 1.18 in August of 23.  Now AKI due to severe sepsis/ MSOF reaction to acute cholecystitis-  on pressors, VDRF.  Started CRRT 9/21 -  machine running well  2. Hypertension/volume  - very hypotensive-  req high dose pressor support at present-  sepsis type picture-  also has been adequately hydrated-  very positive -  Fi02 still OK at 40--- started by keeping even with CRRT-  will try to pull some volume today   3. Cholecystitis-  possibly abscess as well ?  S/p per chole tube and on maxepime and flagyl-  no positive blood cultures as of yet -  still febrile,  WBC climbing, still unstable-  surgery following -  this is the main issue-  4. Anemia  - not an issue at this point  5. Metabolic acidosis-  should correct with CRRT  6. Elytes-  K and phos OK -  corrected calcium is near 8-  CRRT should help to keep stable as well    [facility]    Labs: Basic Metabolic Panel: Recent Labs  Lab 03/12/22 0438 03/12/22 0746 03/12/22 1513 03/12/22 1522 03/14/22 0253 03-14-22 0525 March 14, 2022 0823  NA 127*   < > 130*   < > 129* 131* 130*  K 3.9   < > 3.8   < > 4.8 4.8 4.7  CL 91*  --  94*  --   --  99  --   CO2  18*  --  20*  --   --  18*  --   GLUCOSE 262*  --  193*  --   --  136*  --   BUN 73*  --  66*  --   --  47*  --   CREATININE 6.55*  --  5.36*  --   --  4.17*  --   CALCIUM 5.9*  --  5.6*  --   --  6.4*  --   PHOS  --   --  5.4*  --   --  6.6*  --    < > = values in this interval not displayed.   Liver Function Tests: Recent Labs  Lab 03/09/2022 1104 03/11/22 0336 03/12/22 1513 03/14/22 0525  AST 95* 198*  --  5,877*  ALT 45* 74*  --  3,062*  ALKPHOS 520* 198*  --  300*  BILITOT 3.3* 3.8*  --  6.6*  PROT 5.0* 4.6*  --  3.9*  ALBUMIN 2.2*  1.8* <1.5* 1.6*   Recent Labs  Lab 03/11/22 0336 03/12/22 0956  LIPASE 26 167*   No results for input(s): "AMMONIA" in the last 168 hours. CBC: Recent Labs  Lab 03/20/2022 1104 03/07/2022 1128 03/11/22 0336 03/11/22 0532 03/11/22 1607 03/11/22 1811 03/12/22 0438 03/12/22 0746 April 08, 2022 0253 April 08, 2022 0525 04/08/2022 0823  WBC 6.9  --  25.5*  --  24.3*  --  29.6*  29.4*  --   --  28.8*  --   NEUTROABS 4.8  --   --   --   --   --  26.2*  --   --   --   --   HGB 14.3   < > 13.4   < > 12.0*   < > 12.6*  12.7*   < > 15.0 14.2 14.3  HCT 42.8   < > 38.5*   < > 35.4*   < > 36.5*  35.4*   < > 44.0 40.9 42.0  MCV 102.1*  --  100.0  --  100.9*  --  98.6  96.7  --   --  99.5  --   PLT 30*   < > 28*  --  24*  --  25*  23*  --   --  23*  --    < > = values in this interval not displayed.   Cardiac Enzymes: No results for input(s): "CKTOTAL", "CKMB", "CKMBINDEX", "TROPONINI" in the last 168 hours. CBG: Recent Labs  Lab 03/12/22 1519 03/12/22 1948 03/12/22 2330 04/08/22 0329 04-08-2022 0748  GLUCAP 179* 197* 157* 131* 128*    Iron Studies: No results for input(s): "IRON", "TIBC", "TRANSFERRIN", "FERRITIN" in the last 72 hours. Studies/Results: DG CHEST PORT 1 VIEW  Result Date: 03/12/2022 CLINICAL DATA:  Respiratory failure. EXAM: PORTABLE CHEST 1 VIEW COMPARISON:  Radiographs 03/11/2022 and 02/22/2022.  CT 03/05/2022. FINDINGS: 1227  hours. Two views obtained. Endotracheal tube, bilateral internal jugular central venous catheters and an enteric tube are unchanged in position. The heart size and mediastinal contours are stable. Patchy left basilar airspace disease appears slightly improved. The right lung appears clear for the degree of inspiration. No significant pleural effusion or pneumothorax. Telemetry leads overlie the chest. IMPRESSION: Interval partial clearing of left lower lobe airspace disease. Stable support system. Electronically Signed   By: Richardean Sale M.D.   On: 03/12/2022 13:12   DG CHEST PORT 1 VIEW  Result Date: 03/11/2022 CLINICAL DATA:  Encounter for central line. EXAM: PORTABLE CHEST 1 VIEW COMPARISON:  Chest x-ray from the same day. FINDINGS: Right IJ approach central venous catheter with tip projecting at the lower SVC. No visible pneumothorax. Endotracheal tube tip at the level of the clavicular heads. Gastric tube courses below the diaphragm. Mild interstitial prominence. Mild bibasilar opacities IMPRESSION: 1. Right IJ approach central venous catheter with tip projecting at the lower SVC. No visible pneumothorax. 2. Mild interstitial opacities, which could represent interstitial pulmonary edema versus the sequela of recurrent bouts of CHF. 3. Mild bibasilar opacities, which could represent atelectasis and/or consolidation. Electronically Signed   By: Margaretha Sheffield M.D.   On: 03/11/2022 15:07   ECHOCARDIOGRAM COMPLETE  Result Date: 03/11/2022    ECHOCARDIOGRAM REPORT   Patient Name:   Eric [name] Date of Exam: 03/11/2022 Medical Rec #:  [id number]        Height:       72.0 in Accession #:    [id number]       Weight:  210.0 lb Date of Birth:  [birth year]       BSA:          2.175 m Patient Age:    71 years         BP:           84/65 mmHg Patient Gender: M                HR:           75 bpm. Exam Location:  Inpatient Procedure: 2D Echo, Intracardiac Opacification Agent, Cardiac Doppler and Color             Doppler Indications:    Elevated troponin  History:        Patient has no prior history of Echocardiogram examinations.                 COPD; Risk Factors:Hypertension and Dyslipidemia. Aortic                 atherosclerosis, emphysema.  Sonographer:    Eartha Inch Referring Phys: [id number] VINEET SOOD  Sonographer Comments: Technically difficult study due to poor echo windows and echo performed with patient supine and on artificial respirator. Image acquisition challenging due to patient body habitus, Image acquisition challenging due to respiratory motion and Image acquisition challenging due to COPD. EF 35-40%. IMPRESSIONS  1. Left ventricular ejection fraction, by estimation, is 65 to 70%. The left ventricle has normal function. The left ventricle has no regional wall motion abnormalities. Left ventricular diastolic parameters were normal.  2. Right ventricular systolic function is normal. The right ventricular size is normal. Tricuspid regurgitation signal is inadequate for assessing PA pressure.  3. The mitral valve was not well visualized. Trivial mitral valve regurgitation. No evidence of mitral stenosis.  4. The aortic valve was not well visualized. Aortic valve regurgitation is not visualized. No aortic stenosis is present.  5. The inferior vena cava is normal in size with greater than 50% respiratory variability, suggesting right atrial pressure of 3 mmHg. Comparison(s): No prior Echocardiogram. Conclusion(s)/Recommendation(s): Very limited echo windows. Grossly normal LVEF, with underfilled appearing ventricle. Normal RV size/function. IVC collapses. Valves not well visualized, but no significant stenosis/regurgitation by Doppler. FINDINGS  Left Ventricle: Left ventricular ejection fraction, by estimation, is 65 to 70%. The left ventricle has normal function. The left ventricle has no regional wall motion abnormalities. Definity contrast agent was given IV to delineate the left ventricular   endocardial borders. The left ventricular internal cavity size was small. Suboptimal image quality limits for assessment of left ventricular hypertrophy. Left ventricular diastolic parameters were normal. Right Ventricle: The right ventricular size is normal. Right vetricular wall thickness was not well visualized. Right ventricular systolic function is normal. Tricuspid regurgitation signal is inadequate for assessing PA pressure. Left Atrium: Left atrial size was not well visualized. Right Atrium: Right atrial size was not well visualized. Pericardium: There is no evidence of pericardial effusion. Mitral Valve: The mitral valve was not well visualized. Trivial mitral valve regurgitation. No evidence of mitral valve stenosis. Tricuspid Valve: The tricuspid valve is not well visualized. Tricuspid valve regurgitation is trivial. No evidence of tricuspid stenosis. Aortic Valve: The aortic valve was not well visualized. Aortic valve regurgitation is not visualized. No aortic stenosis is present. Pulmonic Valve: The pulmonic valve was not well visualized. Pulmonic valve regurgitation is not visualized. Aorta: The ascending aorta was not well visualized, the aortic arch was not well visualized and the aortic root was not well  visualized. Venous: The inferior vena cava is normal in size with greater than 50% respiratory variability, suggesting right atrial pressure of 3 mmHg. IAS/Shunts: The interatrial septum was not well visualized.  LEFT VENTRICLE PLAX 2D LVIDd:         3.97 cm   Diastology LVIDs:         2.52 cm   LV e' medial:    8.92 cm/s LV PW:         1.20 cm   LV E/e' medial:  8.8 LV IVS:        0.80 cm   LV e' lateral:   13.90 cm/s LVOT diam:     2.30 cm   LV E/e' lateral: 5.7 LV SV:         91 LV SV Index:   42 LVOT Area:     4.15 cm  RIGHT VENTRICLE             IVC RV S prime:     12.80 cm/s  IVC diam: 2.00 cm TAPSE (M-mode): 2.0 cm LEFT ATRIUM           Index        RIGHT ATRIUM           Index LA diam:       3.50 cm 1.61 cm/m   RA Area:     12.60 cm LA Vol (A4C): 56.4 ml 25.93 ml/m  RA Volume:   30.40 ml  13.98 ml/m  AORTIC VALVE LVOT Vmax:   137.00 cm/s LVOT Vmean:  95.700 cm/s LVOT VTI:    0.219 m  AORTA Ao Root diam: 3.40 cm Ao Asc diam:  3.40 cm MITRAL VALVE MV Area (PHT): 3.48 cm    SHUNTS MV Decel Time: 218 msec    Systemic VTI:  0.22 m MR Peak grad: 5.3 mmHg     Systemic Diam: 2.30 cm MR Vmax:      115.00 cm/s MV E velocity: 78.80 cm/s MV A velocity: 32.30 cm/s MV E/A ratio:  2.44 Buford Dresser MD Electronically signed by Buford Dresser MD Signature Date/Time: 03/11/2022/12:21:40 PM    Final    Medications: Infusions:   prismasol BGK 4/2.5 400 mL/hr at 03-21-2022 0105    prismasol BGK 4/2.5 400 mL/hr at 03/21/22 0105   sodium chloride     sodium chloride Stopped (03/11/22 0809)   amiodarone Stopped (03/12/22 1643)   ceFEPime (MAXIPIME) IV Stopped (03/12/22 2311)   dexmedetomidine (PRECEDEX) IV infusion Stopped (03/12/22 1632)   fentaNYL infusion INTRAVENOUS 75 mcg/hr (Mar 21, 2022 0800)   metronidazole 500 mg (03/21/2022 0805)   norepinephrine (LEVOPHED) Adult infusion 18 mcg/min (March 21, 2022 0800)   prismasol BGK 4/2.5 1,500 mL/hr at 21-Mar-2022 0531   sodium bicarbonate 150 mEq in dextrose 5 % 1,150 mL infusion Stopped (03/12/22 1933)   vasopressin 0.04 Units/min (Mar 21, 2022 0800)    Scheduled Medications:  sodium chloride   Intravenous Once   Chlorhexidine Gluconate Cloth  6 each Topical Daily   hydrocortisone sod succinate (SOLU-CORTEF) inj  100 mg Intravenous Q12H   insulin aspart  0-9 Units Subcutaneous Q4H   mouth rinse  15 mL Mouth Rinse Q2H   pantoprazole (PROTONIX) IV  40 mg Intravenous Daily   sodium chloride flush  10-40 mL Intracatheter Q12H   sodium chloride flush  5 mL Intracatheter Q8H    have reviewed scheduled and prn medications.  Physical Exam: General: mottled extremities -  sedated on vent but opens eyes to stimuli Heart: RRR Lungs: really  mostly  clear Abdomen: distended-  chole drain Extremities: mottled  Dialysis Access: right IJ temp cath- placed 9/20    March 23, 2022,8:37 AM  LOS: 3 days

## 2022-03-22 NOTE — Death Summary Note (Signed)
DEATH SUMMARY   Patient Details  Name: Eric [name] MRN: [id number] DOB: 12/24/1950  Admission/Discharge Information   Admit Date:  04-07-22  Date of Death: Date of Death: 10-Apr-2022  Time of Death: Time of Death: Oct 01, 1910  Length of Stay: 3  Referring Physician: Unk Pinto, MD   Reason(s) for Hospitalization  Presented to the hospital with abdominal pain, nausea with vomiting Found to have acute cholecystitis with septic shock  Diagnoses  Preliminary cause of death:  Septic shock secondary to cholecystitis Secondary Diagnoses (including complications and co-morbidities):  Principal Problem:   Septic shock ([place]) Active Problems:   Chronic respiratory failure with hypoxia ([place]) Cholecystitis Acute kidney injury Hypoxemic respiratory failure atrial fibrillation Acute liver injury E. coli sepsis  Brief Hospital Course (including significant findings, care, treatment, and services provided and events leading to death)  Eric [name] is a 71 y.o. year old male who was admitted to the hospital following complaints of nausea vomiting, abdominal pain.  Was found to have cholecystitis. Presented in septic shock requiring pressors and hemodynamic support.  Did have acute kidney injury.  New onset atrial fibrillation secondary to sepsis. Did have percutaneous cholecystostomy by IR on 2023/04/08 With worsening renal parameters initiated CRRT on 9/21. Continued on antibiotics. Patient continued to deteriorate despite aggressive measures including CRRT, hemodynamic support, ventilator support.  We will continued to engage with family and following discussions with family, based on patient's desires, decision was made to transition to comfort measures.   Pertinent Labs and Studies  Significant Diagnostic Studies DG CHEST PORT 1 VIEW  Result Date: 03/12/2022 CLINICAL DATA:  Respiratory failure. EXAM: PORTABLE CHEST 1 VIEW COMPARISON:  Radiographs 03/11/2022 and 04-07-2022.  CT  04-07-22. FINDINGS: 1227 hours. Two views obtained. Endotracheal tube, bilateral internal jugular central venous catheters and an enteric tube are unchanged in position. The heart size and mediastinal contours are stable. Patchy left basilar airspace disease appears slightly improved. The right lung appears clear for the degree of inspiration. No significant pleural effusion or pneumothorax. Telemetry leads overlie the chest. IMPRESSION: Interval partial clearing of left lower lobe airspace disease. Stable support system. Electronically Signed   By: Richardean Sale M.D.   On: 03/12/2022 13:12   DG CHEST PORT 1 VIEW  Result Date: 03/11/2022 CLINICAL DATA:  Encounter for central line. EXAM: PORTABLE CHEST 1 VIEW COMPARISON:  Chest x-ray from the same day. FINDINGS: Right IJ approach central venous catheter with tip projecting at the lower SVC. No visible pneumothorax. Endotracheal tube tip at the level of the clavicular heads. Gastric tube courses below the diaphragm. Mild interstitial prominence. Mild bibasilar opacities IMPRESSION: 1. Right IJ approach central venous catheter with tip projecting at the lower SVC. No visible pneumothorax. 2. Mild interstitial opacities, which could represent interstitial pulmonary edema versus the sequela of recurrent bouts of CHF. 3. Mild bibasilar opacities, which could represent atelectasis and/or consolidation. Electronically Signed   By: Margaretha Sheffield M.D.   On: 03/11/2022 15:07   ECHOCARDIOGRAM COMPLETE  Result Date: 03/11/2022    ECHOCARDIOGRAM REPORT   Patient Name:   Eric [name] Date of Exam: 03/11/2022 Medical Rec #:  [id number]        Height:       72.0 in Accession #:    [id number]       Weight:       210.0 lb Date of Birth:  1950-07-19       BSA:          2.175 m  Patient Age:    70 years         BP:           84/65 mmHg Patient Gender: M                HR:           75 bpm. Exam Location:  Inpatient Procedure: 2D Echo, Intracardiac Opacification Agent,  Cardiac Doppler and Color            Doppler Indications:    Elevated troponin  History:        Patient has no prior history of Echocardiogram examinations.                 COPD; Risk Factors:Hypertension and Dyslipidemia. Aortic                 atherosclerosis, emphysema.  Sonographer:    Eartha Inch Referring Phys: [id number] VINEET SOOD  Sonographer Comments: Technically difficult study due to poor echo windows and echo performed with patient supine and on artificial respirator. Image acquisition challenging due to patient body habitus, Image acquisition challenging due to respiratory motion and Image acquisition challenging due to COPD. EF 35-40%. IMPRESSIONS  1. Left ventricular ejection fraction, by estimation, is 65 to 70%. The left ventricle has normal function. The left ventricle has no regional wall motion abnormalities. Left ventricular diastolic parameters were normal.  2. Right ventricular systolic function is normal. The right ventricular size is normal. Tricuspid regurgitation signal is inadequate for assessing PA pressure.  3. The mitral valve was not well visualized. Trivial mitral valve regurgitation. No evidence of mitral stenosis.  4. The aortic valve was not well visualized. Aortic valve regurgitation is not visualized. No aortic stenosis is present.  5. The inferior vena cava is normal in size with greater than 50% respiratory variability, suggesting right atrial pressure of 3 mmHg. Comparison(s): No prior Echocardiogram. Conclusion(s)/Recommendation(s): Very limited echo windows. Grossly normal LVEF, with underfilled appearing ventricle. Normal RV size/function. IVC collapses. Valves not well visualized, but no significant stenosis/regurgitation by Doppler. FINDINGS  Left Ventricle: Left ventricular ejection fraction, by estimation, is 65 to 70%. The left ventricle has normal function. The left ventricle has no regional wall motion abnormalities. Definity contrast agent was given IV to delineate  the left ventricular  endocardial borders. The left ventricular internal cavity size was small. Suboptimal image quality limits for assessment of left ventricular hypertrophy. Left ventricular diastolic parameters were normal. Right Ventricle: The right ventricular size is normal. Right vetricular wall thickness was not well visualized. Right ventricular systolic function is normal. Tricuspid regurgitation signal is inadequate for assessing PA pressure. Left Atrium: Left atrial size was not well visualized. Right Atrium: Right atrial size was not well visualized. Pericardium: There is no evidence of pericardial effusion. Mitral Valve: The mitral valve was not well visualized. Trivial mitral valve regurgitation. No evidence of mitral valve stenosis. Tricuspid Valve: The tricuspid valve is not well visualized. Tricuspid valve regurgitation is trivial. No evidence of tricuspid stenosis. Aortic Valve: The aortic valve was not well visualized. Aortic valve regurgitation is not visualized. No aortic stenosis is present. Pulmonic Valve: The pulmonic valve was not well visualized. Pulmonic valve regurgitation is not visualized. Aorta: The ascending aorta was not well visualized, the aortic arch was not well visualized and the aortic root was not well visualized. Venous: The inferior vena cava is normal in size with greater than 50% respiratory variability, suggesting right atrial pressure of 3 mmHg. IAS/Shunts: The  interatrial septum was not well visualized.  LEFT VENTRICLE PLAX 2D LVIDd:         3.97 cm   Diastology LVIDs:         2.52 cm   LV e' medial:    8.92 cm/s LV PW:         1.20 cm   LV E/e' medial:  8.8 LV IVS:        0.80 cm   LV e' lateral:   13.90 cm/s LVOT diam:     2.30 cm   LV E/e' lateral: 5.7 LV SV:         91 LV SV Index:   42 LVOT Area:     4.15 cm  RIGHT VENTRICLE             IVC RV S prime:     12.80 cm/s  IVC diam: 2.00 cm TAPSE (M-mode): 2.0 cm LEFT ATRIUM           Index        RIGHT ATRIUM            Index LA diam:      3.50 cm 1.61 cm/m   RA Area:     12.60 cm LA Vol (A4C): 56.4 ml 25.93 ml/m  RA Volume:   30.40 ml  13.98 ml/m  AORTIC VALVE LVOT Vmax:   137.00 cm/s LVOT Vmean:  95.700 cm/s LVOT VTI:    0.219 m  AORTA Ao Root diam: 3.40 cm Ao Asc diam:  3.40 cm MITRAL VALVE MV Area (PHT): 3.48 cm    SHUNTS MV Decel Time: 218 msec    Systemic VTI:  0.22 m MR Peak grad: 5.3 mmHg     Systemic Diam: 2.30 cm MR Vmax:      115.00 cm/s MV E velocity: 78.80 cm/s MV A velocity: 32.30 cm/s MV E/A ratio:  2.44 Buford Dresser MD Electronically signed by Buford Dresser MD Signature Date/Time: 03/11/2022/12:21:40 PM    Final    DG CHEST PORT 1 VIEW  Result Date: 03/11/2022 CLINICAL DATA:  Hypoxia EXAM: PORTABLE CHEST 1 VIEW COMPARISON:  02/21/2022 FINDINGS: Cardiac shadow is stable. Endotracheal tube, gastric catheter and left jugular central line are noted in satisfactory position. No focal infiltrate or effusion is seen. No bony abnormality is noted. IMPRESSION: No active disease. Electronically Signed   By: Inez Catalina M.D.   On: 03/11/2022 03:47   DG Abd 1 View  Result Date: 03/11/2022 CLINICAL DATA:  Hypoxia and abdominal pain EXAM: ABDOMEN - 1 VIEW COMPARISON:  None Available. FINDINGS: Cholecystostomy tube is noted in satisfactory position. No renal or ureteral calculi seen. Gastric catheter extends into the stomach. No bony abnormality is seen. IMPRESSION: No acute abnormality noted. Electronically Signed   By: Inez Catalina M.D.   On: 03/11/2022 03:47   DG CHEST PORT 1 VIEW  Result Date: 03/21/2022 CLINICAL DATA:  Septic shock, intubated EXAM: PORTABLE CHEST 1 VIEW COMPARISON:  03/20/2022 FINDINGS: Single frontal view of the chest demonstrates endotracheal tube overlying tracheal air column tip midway between thoracic inlet and carina. Enteric catheter passes below diaphragm tip excluded by collimation. Left internal jugular catheter tip overlies superior vena cava. External  defibrillator pads overlie the left chest. Cardiac silhouette is unremarkable. There is increased central vascular congestion, with developing bilateral perihilar ground-glass airspace disease. No effusion or pneumothorax. No acute bony abnormalities. IMPRESSION: 1. Support devices as above. 2. Increased vascular congestion with progressive bilateral perihilar ground-glass airspace disease, most  consistent with fluid overload and mild edema. Electronically Signed   By: Randa Ngo M.D.   On: 03/21/2022 22:29   IR Perc Cholecystostomy  Result Date: 02/26/2022 INDICATION: Acute cholecystitis and sepsis. Percutaneous cholecystostomy E required as the patient is not a candidate for cholecystectomy currently. EXAM: PERCUTANEOUS CHOLECYSTOSTOMY TUBE PLACEMENT MEDICATIONS: None ANESTHESIA/SEDATION: Formal conscious sedation was not performed due to critical illness and poor airway. The patient was given 1.0 mg IV Versed during the procedure. FLUOROSCOPY: Radiation Exposure Index (as provided by the fluoroscopic device): 1.0 mGy Kerma COMPLICATIONS: None immediate. PROCEDURE: Informed written consent was obtained from the patient's wife after a thorough discussion of the procedural risks, benefits and alternatives. All questions were addressed. Maximal Sterile Barrier Technique was utilized including caps, mask, sterile gowns, sterile gloves, sterile drape, hand hygiene and skin antiseptic. A timeout was performed prior to the initiation of the procedure. Ultrasound was used to localize the gallbladder. Local anesthesia was provided with 1% lidocaine. An 18 gauge trocar needle was advanced into the gallbladder lumen under ultrasound guidance. After confirming needle tip position and with return of bile, a guidewire was advanced into the gallbladder lumen and the needle removed. The tract was dilated to 10 [place]. A 10 French percutaneous drain was advanced into the gallbladder. A sample of bile was then withdrawn  through the drain and sent for culture analysis. Contrast was injected via the cholecystostomy tube and a fluoroscopic spot image obtained. The catheter was then flushed with sterile saline and attached to a gravity drainage bag. It was secured at the skin with a Prolene retention suture and StatLock device. FINDINGS: Ultrasound demonstrates inflamed gallbladder with gallbladder wall thickening as well as edematous wall and edema surrounding the gallbladder fossa within the liver. A discrete hepatic abscess is not identified with small pockets of fluid and edema adjacent to the gallbladder within the liver parenchyma. After placement of the cholecystostomy tube there is return of cloudy purulent appearing bile. A sample was sent for culture analysis. IMPRESSION: Placement of percutaneous cholecystostomy tube into the gallbladder. A sample of purulent bile was sent for culture analysis. Initial ultrasound demonstrates adjacent areas of fluid and edema within the liver adjacent to the gallbladder fossa. A discrete unilocular abscess was not identified. Electronically Signed   By: Aletta Edouard M.D.   On: 02/23/2022 16:35   US Abdomen Limited RUQ (LIVER/GB)  Result Date: 02/20/2022 CLINICAL DATA:  Right upper quadrant pain. EXAM: ULTRASOUND ABDOMEN LIMITED RIGHT UPPER QUADRANT COMPARISON:  CT abdomen and pelvis 03/20/2022 FINDINGS: Gallbladder: The gallbladder wall is again thickened, measuring up to 7 mm. The sonographer reports a positive sonographic Murphy's sign" with patient pain when the probe is pressed on the gallbladder. There is again mild pericholecystic fluid. No gallstone is seen. Common bile duct: Diameter: 5 mm, within normal limits. Liver: There is a hypoechoic region with lobular margins just anterior and superior to the gallbladder corresponding to the dense fluid region on today's CT. This measures up to approximately 4.5 x 3.0 x 4.2 cm. There is heterogeneous hepatic echotexture. Portal vein  is patent on color Doppler imaging with normal direction of blood flow towards the liver. Other: None. IMPRESSION: 1. There is again gallbladder wall thickening and pericholecystic fluid. Positive sonographic Murphy's sign. Findings suggest acalculous cholecystitis. Note is made that there was primary or secondary inflammatory change within the adjacent hepatic flexure of the colon on today's CT. It is unclear whether the primary cause of the inflammation is the gallbladder, colon,  or both. 2. Mildly complex fluid collection adjacent to the gallbladder fossa as seen on CT. Differential considerations again include a possible complex cyst or abscess. Electronically Signed   By: Yvonne Kendall M.D.   On: 02/20/2022 15:19   CT ABDOMEN PELVIS W CONTRAST  Result Date: 03/08/2022 CLINICAL DATA:  Abdominal pain, acute, nonlocalized EXAM: CT ABDOMEN AND PELVIS WITH CONTRAST TECHNIQUE: Multidetector CT imaging of the abdomen and pelvis was performed using the standard protocol following bolus administration of intravenous contrast. RADIATION DOSE REDUCTION: This exam was performed according to the departmental dose-optimization program which includes automated exposure control, adjustment of the mA and/or kV according to patient size and/or use of iterative reconstruction technique. CONTRAST:  4m OMNIPAQUE IOHEXOL 350 MG/ML SOLN COMPARISON:  None Available. FINDINGS: Lower chest: Bibasilar subsegmental atelectasis. Heart size within normal limits. Gynecomastia is evident on the right. Hepatobiliary: Abnormal appearance of the gallbladder which is moderately dilated with wall thickening, pericholecystic fluid, and pericholecystic fat stranding. No hyperdense gallstone. There is an irregular hypoattenuating lesion within the liver adjacent to the gallbladder fossa measuring approximately 4.4 x 2.5 x 3.6 cm with internal density slightly greater than fluid. Liver appears otherwise unremarkable. No intrahepatic biliary  dilatation. Pancreas: Unremarkable. No pancreatic ductal dilatation or surrounding inflammatory changes. Spleen: Normal in size without focal abnormality. Adrenals/Urinary Tract: Unremarkable adrenal glands. Kidneys enhance symmetrically without solid lesion, stone, or hydronephrosis. Ureters are nondilated. Urinary bladder is decompressed by Foley catheter. Stomach/Bowel: Long segment wall thickening of the colon centered at the hepatic flexure, which may be reactive secondary to local inflammation associated with the gallbladder. Sigmoid diverticulosis. Normal appendix in the right lower quadrant. No dilated loops of bowel. Small hiatal hernia. Vascular/Lymphatic: Scattered aortoiliac atherosclerotic calcifications without aneurysm. No abdominopelvic lymphadenopathy. Reproductive: Prostate is unremarkable. Other: No ascites. No abdominopelvic fluid collection. No pneumoperitoneum. No abdominal wall hernia. Musculoskeletal: No acute or significant osseous findings. IMPRESSION: 1. Appearance of the gallbladder is highly suspicious for acute cholecystitis. Consider further evaluation with right upper quadrant ultrasound. 2. Irregular low-density lesion within the liver adjacent to the gallbladder fossa measuring up to 4.4 cm. This could represent a hepatic abscess, complex cyst, or neoplasm. 3. Long segment wall thickening of the colon centered at the hepatic flexure, which may be reactive secondary to local inflammation associated with the gallbladder versus a nonspecific colitis. 4. Sigmoid diverticulosis without evidence of acute diverticulitis. 5. Aortic Atherosclerosis (ICD10-I70.0). Electronically Signed   By: NDavina PokeD.O.   On: 03/21/2022 14:02   CT Angio Chest PE W and/or Wo Contrast  Result Date: 03/09/2022 CLINICAL DATA:  Chest pain. EXAM: CT ANGIOGRAPHY CHEST WITH CONTRAST TECHNIQUE: Multidetector CT imaging of the chest was performed using the standard protocol during bolus administration of  intravenous contrast. Multiplanar CT image reconstructions and MIPs were obtained to evaluate the vascular anatomy. RADIATION DOSE REDUCTION: This exam was performed according to the departmental dose-optimization program which includes automated exposure control, adjustment of the mA and/or kV according to patient size and/or use of iterative reconstruction technique. CONTRAST:  876mOMNIPAQUE IOHEXOL 350 MG/ML SOLN COMPARISON:  June 28, 2018. FINDINGS: Cardiovascular: Satisfactory opacification of the pulmonary arteries to the segmental level. No evidence of pulmonary embolism. Normal heart size. No pericardial effusion. Mediastinum/Nodes: No enlarged mediastinal, hilar, or axillary lymph nodes. Thyroid gland, trachea, and esophagus demonstrate no significant findings. Lungs/Pleura: Lungs are clear. No pleural effusion or pneumothorax. Upper Abdomen: No acute abnormality. Musculoskeletal: No chest wall abnormality. No acute or significant osseous findings. Review  of the MIP images confirms the above findings. IMPRESSION: No definite evidence of pulmonary embolus. No acute abnormality seen in the chest. Electronically Signed   By: Marijo Conception M.D.   On: 03/04/2022 13:58   CT Head Wo Contrast  Result Date: 02/21/2022 CLINICAL DATA:  Head trauma. EXAM: CT HEAD WITHOUT CONTRAST TECHNIQUE: Contiguous axial images were obtained from the base of the skull through the vertex without intravenous contrast. RADIATION DOSE REDUCTION: This exam was performed according to the departmental dose-optimization program which includes automated exposure control, adjustment of the mA and/or kV according to patient size and/or use of iterative reconstruction technique. COMPARISON:  None Available. FINDINGS: Brain: No evidence of acute infarction, hemorrhage, hydrocephalus, extra-axial collection or mass lesion/mass effect. There are sequela of moderate chronic microvascular ischemic change with advanced generalized volume  loss. Vascular: No hyperdense vessel or unexpected calcification. Skull: Normal. Negative for fracture or focal lesion. Sinuses/Orbits: Bilateral lens replacement. Mild mucosal thickening bilateral maxillary and ethmoid sinuses, as well as right sphenoid sinus. Other: None. IMPRESSION: No CT evidence of acute intracranial injury. Electronically Signed   By: Marin Roberts M.D.   On: 02/24/2022 13:51   DG Chest Port 1 View  Result Date: 02/25/2022 CLINICAL DATA:  Provided history: Questionable sepsis-evaluate for abnormality. EXAM: PORTABLE CHEST 1 VIEW COMPARISON:  Prior chest radiographs 09/12/2020 and earlier. FINDINGS: Heart size within normal limits. Aortic atherosclerosis. Minimal atelectasis within the right lung base. No appreciable airspace consolidation or pulmonary edema. No evidence of pleural effusion or pneumothorax. No acute bony abnormality identified. IMPRESSION: Minimal atelectasis within the right lung base. Otherwise, no evidence of acute cardiopulmonary abnormality. Aortic Atherosclerosis (ICD10-I70.0). Electronically Signed   By: Kellie Simmering D.O.   On: 03/08/2022 12:23    Microbiology Recent Results (from the past 240 hour(s))  Resp Panel by RT-PCR (Flu A&B, Covid) Anterior Nasal Swab     Status: None   Collection Time: 03/14/2022 11:06 AM   Specimen: Anterior Nasal Swab  Result Value Ref Range Status   SARS Coronavirus 2 by RT PCR NEGATIVE NEGATIVE Final    Comment: (NOTE) SARS-CoV-2 target nucleic acids are NOT DETECTED.  The SARS-CoV-2 RNA is generally detectable in upper respiratory specimens during the acute phase of infection. The lowest concentration of SARS-CoV-2 viral copies this assay can detect is 138 copies/mL. A negative result does not preclude SARS-Cov-2 infection and should not be used as the sole basis for treatment or other patient management decisions. A negative result may occur with  improper specimen collection/handling, submission of specimen  other than nasopharyngeal swab, presence of viral mutation(s) within the areas targeted by this assay, and inadequate number of viral copies(<138 copies/mL). A negative result must be combined with clinical observations, patient history, and epidemiological information. The expected result is Negative.  Fact Sheet for Patients:  EntrepreneurPulse.com.au  Fact Sheet for Healthcare Providers:  IncredibleEmployment.be  This test is no t yet approved or cleared by the [place] FDA and  has been authorized for detection and/or diagnosis of SARS-CoV-2 by FDA under an Emergency Use Authorization (EUA). This EUA will remain  in effect (meaning this test can be used) for the duration of the COVID-19 declaration under Section 564(b)(1) of the Act, 21 U.S.C.section 360bbb-3(b)(1), unless the authorization is terminated  or revoked sooner.       Influenza A by PCR NEGATIVE NEGATIVE Final   Influenza B by PCR NEGATIVE NEGATIVE Final    Comment: (NOTE) The Xpert Xpress SARS-CoV-2/FLU/RSV plus assay is  intended as an aid in the diagnosis of influenza from Nasopharyngeal swab specimens and should not be used as a sole basis for treatment. Nasal washings and aspirates are unacceptable for Xpert Xpress SARS-CoV-2/FLU/RSV testing.  Fact Sheet for Patients: EntrepreneurPulse.com.au  Fact Sheet for Healthcare Providers: IncredibleEmployment.be  This test is not yet approved or cleared by the [place] FDA and has been authorized for detection and/or diagnosis of SARS-CoV-2 by FDA under an Emergency Use Authorization (EUA). This EUA will remain in effect (meaning this test can be used) for the duration of the COVID-19 declaration under Section 564(b)(1) of the Act, 21 U.S.C. section 360bbb-3(b)(1), unless the authorization is terminated or revoked.  Performed at [facility], [place] [address], [place],  [place] [address]   Urine Culture     Status: None   Collection Time: 03/20/2022 11:21 AM   Specimen: In/Out Cath Urine  Result Value Ref Range Status   Specimen Description IN/OUT CATH URINE  Final   Special Requests NONE  Final   Culture   Final    NO GROWTH Performed at [facility], [place] [address], [place], [place] [address]    Report Status 03/11/2022 FINAL  Final  Blood Culture (routine x 2)     Status: None   Collection Time: 03/03/2022 12:36 PM   Specimen: BLOOD  Result Value Ref Range Status   Specimen Description BLOOD  Final   Special Requests NONE  Final   Culture   Final    NO GROWTH 5 DAYS Performed at [place] [address], [place], [place] [address]    Report Status 03/15/2022 FINAL  Final  Blood Culture (routine x 2)     Status: None   Collection Time: 03/16/2022 12:36 PM   Specimen: BLOOD  Result Value Ref Range Status   Specimen Description BLOOD SITE NOT SPECIFIED  Final   Special Requests   Final    BOTTLES DRAWN AEROBIC AND ANAEROBIC Blood Culture adequate volume   Culture   Final    NO GROWTH 5 DAYS Performed at [facility] [address], [place], [place] [address]    Report Status 03/15/2022 FINAL  Final  Aerobic/Anaerobic Culture w Gram Stain (surgical/deep wound)     Status: None   Collection Time: 03/04/2022  4:23 PM   Specimen: Gallbladder; Bile  Result Value Ref Range Status   Specimen Description GALL BLADDER  Final   Special Requests Normal  Final   Gram Stain NO WBC SEEN MODERATE GRAM NEGATIVE RODS   Final   Culture   Final    ABUNDANT ESCHERICHIA COLI NO ANAEROBES ISOLATED Performed at [facility], [place] [address], [place], [place] [address]    Report Status 03/15/2022 FINAL  Final   Organism ID, Bacteria ESCHERICHIA COLI  Final      Susceptibility   Escherichia coli - MIC*    AMPICILLIN 4 SENSITIVE Sensitive     CEFAZOLIN <=4 SENSITIVE Sensitive     CEFEPIME <=0.12 SENSITIVE Sensitive     CEFTAZIDIME <=1  SENSITIVE Sensitive     CEFTRIAXONE <=0.25 SENSITIVE Sensitive     CIPROFLOXACIN <=0.25 SENSITIVE Sensitive     GENTAMICIN <=1 SENSITIVE Sensitive     IMIPENEM <=0.25 SENSITIVE Sensitive     TRIMETH/SULFA <=20 SENSITIVE Sensitive     AMPICILLIN/SULBACTAM <=2 SENSITIVE Sensitive     PIP/TAZO <=4 SENSITIVE Sensitive     * ABUNDANT ESCHERICHIA COLI  MRSA Next Gen  by PCR, Nasal     Status: None   Collection Time: 03/11/22  8:02 AM   Specimen: Nasal Mucosa; Nasal Swab  Result Value Ref Range Status   MRSA by PCR Next Gen NOT DETECTED NOT DETECTED Final    Comment: (NOTE) The GeneXpert MRSA Assay (FDA approved for NASAL specimens only), is one component of a comprehensive MRSA colonization surveillance program. It is not intended to diagnose MRSA infection nor to guide or monitor treatment for MRSA infections. Test performance is not FDA approved in patients less than 74 years old. Performed at [facility], [place] [address], [place], [place] [address]     Lab Basic Metabolic Panel: Recent Labs  Lab 03/15/2022 1236 03/02/2022 1620 03/11/22 0336 03/11/22 0532 03/11/22 1600 03/11/22 1811 03/12/22 0438 03/12/22 0746 03/12/22 1513 03/12/22 1522 03/12/22 1627 2022-03-31 0253 03-31-2022 0525 March 31, 2022 0823 31-Mar-2022 1315  NA  --    < > 133*   < > 130*   < > 127*   < > 130*   < > 129* 129* 131* 130* 130*  K  --    < > 4.7   < > 5.0   < > 3.9   < > 3.8   < > 3.7 4.8 4.8 4.7 4.8  CL  --    < > 101  --  93*  --  91*  --  94*  --   --   --  99  --   --   CO2  --    < > 12*  --  16*  --  18*  --  20*  --   --   --  18*  --   --   GLUCOSE  --    < > 149*  --  241*  --  262*  --  193*  --   --   --  136*  --   --   BUN  --    < > 68*  --  68*  --  73*  --  66*  --   --   --  47*  --   --   CREATININE  --    < > 5.45*  --  5.89*  --  6.55*  --  5.36*  --   --   --  4.17*  --   --   CALCIUM  --    < > 8.0*  --  6.5*  --  5.9*  --  5.6*  --   --   --  6.4*  --   --   MG 1.9  --  2.5*  --   --    --   --   --   --   --   --   --  2.5*  --   --   PHOS  --   --   --   --   --   --   --   --  5.4*  --   --   --  6.6*  --   --    < > = values in this interval not displayed.   Liver Function Tests: Recent Labs  Lab 02/25/2022 1104 03/11/22 0336 03/12/22 1513 2022-03-31 0525  AST 95* 198*  --  5,877*  ALT 45* 74*  --  3,062*  ALKPHOS 520* 198*  --  300*  BILITOT 3.3* 3.8*  --  6.6*  PROT 5.0* 4.6*  --  3.9*  ALBUMIN 2.2* 1.8* <1.5* 1.6*   Recent Labs  Lab 03/11/22 0336 03/12/22 0956 04/11/2022 1052  LIPASE 26 167* 133*   No results for input(s): "AMMONIA" in the last 168 hours. CBC: Recent Labs  Lab 03/02/2022 1104 03/01/2022 1128 02/27/2022 1454 03/09/2022 2121 03/11/22 0336 03/11/22 0532 03/11/22 1607 03/11/22 1811 03/12/22 0438 03/12/22 0746 03/12/22 1627 2022/04/11 0253 04/11/22 0525 Apr 11, 2022 0823 04-11-2022 1315  WBC 6.9  --   --   --  25.5*  --  24.3*  --  29.6*  29.4*  --   --   --  28.8*  --   --   NEUTROABS 4.8  --   --   --   --   --   --   --  26.2*  --   --   --   --   --   --   HGB 14.3   < >  --    < > 13.4   < > 12.0*   < > 12.6*  12.7*   < > 12.2* 15.0 14.2 14.3 13.6  HCT 42.8   < >  --    < > 38.5*   < > 35.4*   < > 36.5*  35.4*   < > 36.0* 44.0 40.9 42.0 40.0  MCV 102.1*  --   --   --  100.0  --  100.9*  --  98.6  96.7  --   --   --  99.5  --   --   PLT 30*  --  25*  --  28*  --  24*  --  25*  23*  --   --   --  23*  --   --    < > = values in this interval not displayed.   Cardiac Enzymes: No results for input(s): "CKTOTAL", "CKMB", "CKMBINDEX", "TROPONINI" in the last 168 hours. Sepsis Labs: Recent Labs  Lab 03/11/22 0336 03/11/22 0739 03/11/22 1607 03/11/22 2050 03/12/22 0438 03/12/22 0447 03/12/22 0742 03/12/22 0956 03/12/22 1502 04-11-22 0525 04/11/2022 0742  WBC 25.5*  --  24.3*  --  29.6*  29.4*  --   --   --   --  28.8*  --   LATICACIDVEN  --    < >  --    < >  --    < > 4.0* 3.7* 4.0*  --  8.7*  5.5*   < > = values in this  interval not displayed.    Procedures/Operations  IR cholecystostomy 03/17/2022   Dwayna Kentner A Kimba Lottes 03/16/2022, 11:13 AM

## 2022-03-22 NOTE — Consult Note (Signed)
Consultation Note Date: April 12, 2022   Patient Name: Eric [name]  DOB: 12/04/1950  MRN: [id number]  Age / Sex: 71 y.o., male  PCP: Unk Pinto, MD Referring Physician: Laurin Coder, MD  Reason for Consultation: Establishing goals of care  HPI/Patient Profile: 71 y.o. male  with past medical history of HTN, HLD, emphysema of lung, depression admitted on 03/09/2022 with abd pain with nausea and vomiting due to acute cholecystitis with liver abscess and septic shock. Hospitalization complicated by AF RVR, renal failure CRRT began 9/21.   Clinical Assessment and Goals of Care: I met today after reviewing records, diagnostics, and speaking with PCCM. Long conversation with wife and daughter, [name] and [name], regarding Eric [name]'s goals of care. Family are very realistic and know that Mikki Santee would not be happy with a life where he was unable to ride his motorcycle and be completely functional. They allude to chronic illness in his parents that left them in debilitated states that he would not desire for himself.   We spent time talking through goals of care and how we can best care for Eric [name] at this stage. They express frustration with providing interventions and care that will not lead to improved outcome and do not want to put [facility] through this. Ultimately we were able to come to the conclusion that DNR and full comfort care transition is how we can best care for Eric [name] and maintain his dignity. Family do not want to prolong this decision and wish to proceed with full comfort care today. We talked through timing and what to expect. The priority is that he remains comfortable and does not suffer. Very important to [name] that he is allowed to remain in his ICU room until he dies. With all the sudden changes she finds comfort in knowing the staff and care here in ICU and trusts that he will have the care he deserves here.    I was present during extubation and assisted with titration of dilaudid infusion to obtain comfort. Support provided to family at bedside. Prognosis poor with multiorgan failure and I anticipate prognosis of hours most likely.   All questions/concerns addressed. Emotional support provided. Discussed and coordinated care with medical team, nursing, and RT.   Primary Decision Maker NEXT OF KIN wife [name], daughter [name]    SUMMARY OF RECOMMENDATIONS   - DNR decided - Full comfort care decided  Code Status/Advance Care Planning: DNR   Symptom Management:  Dilaudid infusion with liberal bolus and titration to achieve and maintain comfort.  PRN comfort medications available.   Prognosis:  Hours - Days  Discharge Planning: Anticipated Hospital Death      Primary Diagnoses: Present on Admission:  Septic shock ([place])   I have reviewed the medical record, interviewed the patient and family, and examined the patient. The following aspects are pertinent.  Past Medical History:  Diagnosis Date   Arteriosclerosis of aorta (HCC)    Arthritis    Basal cell carcinoma    GERD (gastroesophageal reflux disease)  Hyperlipidemia    Labile hypertension    pt denies    OA (osteoarthritis) of knee 11/25/2020   Other testicular hypofunction    Prediabetes    (A1c 5.8% / 2013)   Vitamin D deficiency    Social History   Socioeconomic History   Marital status: Married    Spouse name: Not on file   Number of children: Not on file   Years of education: Not on file   Highest education level: Not on file  Occupational History   Not on file  Tobacco Use   Smoking status: Former    Packs/day: 1.50    Years: 37.00    Total pack years: 55.50    Types: Cigarettes    Start date: 06/08/1968    Quit date: 10/10/2004    Years since quitting: 17.4   Smokeless tobacco: Never  Vaping Use   Vaping Use: Never used  Substance and Sexual Activity   Alcohol use: Yes    Alcohol/week: 2.0 -  3.0 standard drinks of alcohol    Types: 2 - 3 Standard drinks or equivalent per week   Drug use: No   Sexual activity: Not on file  Other Topics Concern   Not on file  Social History Narrative   Not on file   Social Determinants of Health   Financial Resource Strain: Not on file  Food Insecurity: Not on file  Transportation Needs: Not on file  Physical Activity: Not on file  Stress: Not on file  Social Connections: Not on file   Family History  Problem Relation Age of Onset   Diabetes Mother    Alzheimer's disease Mother    Heart disease Father    Hypertension Father    Stroke Father    Colon polyps Father    Scheduled Meds:  sodium chloride   Intravenous Once   Chlorhexidine Gluconate Cloth  6 each Topical Daily   hydrocortisone sod succinate (SOLU-CORTEF) inj  100 mg Intravenous Q12H   insulin aspart  0-15 Units Subcutaneous Q4H   mouth rinse  15 mL Mouth Rinse Q2H   pantoprazole (PROTONIX) IV  40 mg Intravenous Daily   sodium chloride flush  10-40 mL Intracatheter Q12H   sodium chloride flush  5 mL Intracatheter Q8H   Continuous Infusions:   prismasol BGK 4/2.5 400 mL/hr at 03/17/2022 0105    prismasol BGK 4/2.5 400 mL/hr at Mar 17, 2022 0105   sodium chloride     sodium chloride Stopped (03/11/22 0809)   ceFEPime (MAXIPIME) IV Stopped (03/17/2022 1001)   dexmedetomidine (PRECEDEX) IV infusion 0.5 mcg/kg/hr (Mar 17, 2022 1100)   fentaNYL infusion INTRAVENOUS 135 mcg/hr (2022/03/17 1100)   metronidazole Stopped (2022-03-17 0905)   norepinephrine (LEVOPHED) Adult infusion 9 mcg/min (Mar 17, 2022 1100)   prismasol BGK 4/2.5 1,500 mL/hr at 03-17-2022 0850   TPN ADULT (ION)     vasopressin 0.04 Units/min (03-17-2022 1100)   PRN Meds:.Place/Maintain arterial line **AND** sodium chloride, acetaminophen, fentaNYL (SUBLIMAZE) injection, heparin, levalbuterol, lip balm, midazolam, mouth rinse, sodium chloride, sodium chloride flush Allergies  Allergen Reactions   Lipitor [Atorvastatin] Other  (See Comments)    Cloudy headed    Lopid [Gemfibrozil] Other (See Comments)    Unknown reaction. Patient doesn't remember taking this medication.   Penicillins Other (See Comments)    Unknown childhood reaction Tolerated Cephalosporin Date: 11/26/20. Tolerated Cephalosporin Date: 05/06/21.     Review of Systems  Unable to perform ROS: Acuity of condition    Physical Exam Vitals and nursing note  reviewed.  Constitutional:      General: He is not in acute distress.    Appearance: He is ill-appearing.     Interventions: He is intubated.  Cardiovascular:     Rate and Rhythm: Tachycardia present.     Comments: Ectopy noted Pulmonary:     Effort: No tachypnea, accessory muscle usage or respiratory distress. He is intubated.  Abdominal:     General: There is distension.  Neurological:     Mental Status: He is unresponsive.     Comments: Sedated     Vital Signs: BP 106/65   Pulse 86   Temp 99.6 F (37.6 C) (Axillary)   Resp (!) 5   Ht 6' (1.829 m)   Wt 114.3 kg   SpO2 (!) 89%   BMI 34.18 kg/m  Pain Scale: CPOT   Pain Score: 5    SpO2: SpO2: (!) 89 % O2 Device:SpO2: (!) 89 % O2 Flow Rate: .O2 Flow Rate (L/min): 30 L/min  IO: Intake/output summary:  Intake/Output Summary (Last 24 hours) at 04/04/2022 1204 Last data filed at Apr 04, 2022 1100 Gross per 24 hour  Intake 3944.41 ml  Output 4320 ml  Net -375.59 ml    LBM: Last BM Date : 03/09/22 Baseline Weight: Weight: 95.3 kg Most recent weight: Weight: 114.3 kg     Palliative Assessment/Data:     Time In: 1400  Time Total: 150 min  Greater than 50%  of this time was spent counseling and coordinating care related to the above assessment and plan.  Signed by: Vinie Sill, NP Palliative Medicine Team Pager # [phone] (M-F 8a-5p) Team Phone # [phone] (Nights/Weekends)

## 2022-03-22 NOTE — Progress Notes (Signed)
NAME:  Eric [name], MRN:  [id number], DOB:  [birth year], LOS: 3 ADMISSION DATE:  03/15/2022,  Attending  MD:  Freda Jackson, CHIEF COMPLAINT:  Septic shock secondary to cholecystitis s/p VIR Cholecystostomy   History of Present Illness:  71 year old male with PMH of HLD, labile HTN, remote smoking history who presented from home with abdominal pain.  Patient reports he had sudden onset of abdominal pain with nausea and vomiting on 9/14 in which he attributed to food poisoning.  Has only been able to tolerate liquids, saltine crackers, and some protein shakes over the weekend.  Tried eating soup yesterday and vomited that up with one episode of diarrhea and had chills.  Denies fevers, SOB, or chest pain.  Has abrasion to left knee, patient does not remember how he obtained that.  Reports not voiding as normal.  Given worsening abdominal pain, he presented to ER.    Arrived in ER in Afib with RVR and normotensive initially.  Was placed on esmolol for rate control then became hypotensive requiring emergent cardioversion back to ST.  Ongoing hypotension despite 3L IVF, therefore started on norepinephrine.    Workup in ER noted for K 2.8, sCr 4.86 (previously 1.18 in 01/2022), bicarb 14, iCa 1.07, alk phos 520, AST/ ALT 95/45, t. Bili 3.3, trop hs 821, WBC 6.9, Hgb 14.3, plts 30, lactic acid > 9 with repeat 8.9.  CTA PE was neg for PE with no acute abnormalities in the chest, CTH neg, and CT a/p w/contrast concerning for acute cholecystitis and liver lesion adjacent to the gallbladder which could represent hepatic abscess, complex cyst, or neoplasm.  RUQ US showed gallbladder thickening and pericholecystic fluid, positive Murphy's sign suggestive of acalculous cholecystitis, mildly complex fluid collection adjacent to gallbladder seen again.  Cultures sent and started on empiric abx.  Treated with calcium, bicarb push then drip, and KCL.  Surgery and IR were consulted and IR performed a percutaneous  cholecystostomy with drain in place.   Pertinent  Medical History  HLD Labile HTN Emphysema of the lung Hx of Basal Cell Cancer Remote but significant smoking Hx Depression  Consults  Nephrology Palliative   Significant Hospital Events: Including procedures, antibiotic start and stop dates in addition to other pertinent events   9/19: Arterial line placed, IR percutaneous cholecystostomy, CVC catheter placement left IJV, intubation for respiratory failure 9/20 Right IJV HD catheter placed 9/21 CRRT initiated  Interim History / Subjective:  Overnight patient was progressively bradycardic despite cessation of amiodarone gtt., and Precedex.  Patient was bradycardic until 2 AM, which point patient's heart rate improved with simultaneous decrease in Levophed support.  Patient was weaned off of Levophed at this time; however, patient's maps began to decrease at approximately 5 AM and Levophed was restarted and increased to 17 mcg/minute.  Patient was stable at this dose for the duration of the shift.  Additionally, patient was hypocalcemic, and was repleted with 1 g of calcium gluconate at shift change and then additionally repleted in approximately 6 AM with 2 g of calcium gluconate.  Nursing notes that bicarb was not paused until shift change last night.  CRRT was tolerated overnight without issue.  Objective   Blood pressure 106/65, pulse 86, temperature 99.6 F (37.6 C), temperature source Axillary, resp. rate (!) 5, height 6' (1.829 m), weight 114.3 kg, SpO2 (!) 89 %. CVP:  [8 mmHg-12 mmHg] 12 mmHg  Vent Mode: PRVC FiO2 (%):  [40 %] 40 % Set Rate:  [20  bmp-24 bmp] 20 bmp Vt Set:  [620 mL] 620 mL PEEP:  [5 cmH20-8 cmH20] 5 cmH20 Pressure Support:  [5 cmH20] 5 cmH20 Plateau Pressure:  [16 cmH20-23 cmH20] 16 cmH20   Intake/Output Summary (Last 24 hours) at 04-10-2022 1205 Last data filed at Apr 10, 2022 1100 Gross per 24 hour  Intake 3944.41 ml  Output 4320 ml  Net -375.59 ml     Filed Weights   02/20/2022 1115 03/12/22 0449 04-10-2022 0500  Weight: 95.3 kg 118.4 kg 114.3 kg    Examination: General: Critically ill appearing, intubated and sedated and lying in bed HENT: CVC present in left IJV. HD catheter present in right IJV.  Lungs: Clear to ausculation in lung fields, breathing with ventilator. Cardiovascular: Regular rate and rhythm, S1 and S2 appreciated, but difficult to hear over ventilator. no obvious murmurs rubs or gallops Abdomen: Percutaneous Drain present in RUQ.has pain, and still draining dark yellow purulent drainage.  Abdomen is soft, nontender, nondistended Extremities: Severely mottled bilateral upper and lower extremities, without dorsalis pedis/posterior tibialpulses auscultated bilaterally (doppler). Mottling is present to mid thigh and includes penis. Mottling is present on UE to proximal forearms, bilaterally. Hands/finger nails are dusky.  Neuro: Patient opens his eyes to pain, and groans with pain.  He opens his eyes spontaneously as well, but does not follow commands or track. GU: Foley in place, significant scrotal edema still in place with overlying erythema/mottling.  Edema is stable since yesterday, scrotal sling in place.  Resolved Hospital Problem list     Assessment & Plan:  Septic shock  2/2  acute acalculous cholecystitis s/p IR perc. Cholecystostomy Patient is critically ill. Current pressor regimen of Levophed and Vasopressin, with stable MAPs. As of today, patient distal pulses can no longer be auscultated via Doppler.  Percutaneous drain continues to be patent, and has drained under 120 mL over the past 24 hours.  Lactate at 4 from 3.7.  Most recent ABG with a pH of 7.33 from 7.341, PCO2 of 38.1 from 35.5, and a bicarb of 20.1 from 19.3.  Currently on Day 4 of Cefepime and Flagyl, Vanc was dc'ed 2/20.  Perc Drain culture has grown E.coli, but abx susceptibility is still pending.  Blood cultures negative at 48 hours, 72-hour check  pending.  Most recent lipase of 167 - Continue NE and Vaso, with MAP goal 65-75, titrate as possible - follow cultures, susceptibility - Continue cefepime, and flagyl - Daily CBC - Rechecking lipase  - Trend lactate - Surgery following, appreciate recs - Consulted palliative care, appreciate recs   Anuric AKI  Hypokalemia  AGMA/ lactic acidosis AKI likely pre-renal from poor PO intake, hypotension no prior history of renal disease.  Initial UA with mod Hgb, no RBC, 100 protein, neg leuk/ nitrates, follow UC. S/p 2 runs of KCL in ER. No hydronephrosis/ blockage noted on CT a/p.  As of today, trace edema on exam, patient is still anuric with only 21 cc of urine output over the past 24 hours.  Lactate of 4 from 3.7.  Morning ABG with a pH of 7.33.  CRRT was initiated yesterday without incidence, patient was kept net even.   -Continue CRRT, with goal to take volume off today per nephro recommendations - Nephrology following, appreciate continued recs - strict I/Os - avoid further nephrotoxins  - Continue foley  Significant Transaminitis Patient was originally admitted with elevated liver enzymes, with initial values of an AST at 95, ALT at 45, and alk phos at 520.  A  possible hepatic cyst vs abscess was identified on initial imaging, but during IR procedure, there was no identifiable abscess to drain.  As of today, there is been a significant increase in the liver enzymes.  AST is now at 5877 and ALT is at 3062 with the most recent alk phos at 300.  -We will order CT abdomen pelvis without contrast to assess for presence of intra-abdominal pathology.  Ordered without contrast per nephro recs given ongoing anuric AKI. - Consider hepatitis panel pending CT A/P  Hypocalcemia Patient continues to be hypocalcemic, with ongoing repletion necessary.  Patient is not using citrate is medium for CRRT.  Again likely secondary to AKI.  - will continue to monitor and replete as necessary. -Continue CRRT    Hypoxia respiratory failure  Patient was intubated given respiratory failure in the context of septic shock. Initial CXR and CTA PE neg for acute process. As of today, breathing with vent.  On fentanyl gtt 85mg/min and precedex for sedation. Current Vent settings:PRVC: 30% Fio2, 24 RR, Vt set: 450 mL, PEEP 5cmh2o,.  As of today patient has tolerated  trials of PS/CPAP well, and been able to pull significant tidal volumes without issue.  Lung exam continues to demonstrate lungs are clear to auscultation bilaterally, notably without crackles or wheezes wheezes.  -Continue trials of PS/CPAP   New onset Afib with RVR  Elevated hs troponin  Bradycardia New onset Afib with no prior hx, s/p cardioversion in ER. Amiodarone was started at time of presentation and downtrend to 30 mg/minute.  Elevated troponin likely secondary to demand in context of shock, peak of 886. Formal echo demonstrated normal LVEF with no wall motion abnormalities as well as normal RV systolic function.  As of today, patient was progressively bradycardic throughout the night, and amiodarone was stopped. - ideally goal K> 4, Mag> 2   Coagulapathy, elevated INR Thrombocytopenia Initial DIC panel wnl.  Previous d-dimer was elevated and fibrinogen WNL. No schistocytes seen on initial smear.  As of today platelets at 23 from 28, HgB at 14.2 from 12.7.  Clinical picture not consistent with DIC, coagulopathy likely related to sepsis.  -Rechecking PT/INR  Nutrition Patient is reportedly been without food since time of initial symptom onset which was 9/14.  Given concern for mesenteric ischemia in the context of ongoing need for pressors, and possibility of intra-abdominal pathology, will proceed with TPN over enteral nutrition at this time. -Start TPN, with protein concentrated formula to minimize fluid input    HLD - hold crestor with transaminitis   Best Practice (right click and "Reselect all SmartList Selections" daily)    Diet/type: Will assess tomorrow DVT prophylaxis: SCD, GI prophylaxis: PPI Lines: Central line, Dialysis Catheter, Arterial Line, and yes and it is still needed Foley:  Yes, and it is still needed Code Status:  full code Last date of multidisciplinary goals of care discussion [9/19,9/20] Wife, Son-in-law at bedside, updated on plan of care  Labs   CBC: Recent Labs  Lab 03/18/2022 1104 03/04/2022 1128 02/20/2022 1454 03/16/2022 2121 03/11/22 0336 03/11/22 0532 03/11/22 1607 03/11/22 1811 03/12/22 0438 03/12/22 0746 03/12/22 1522 03/12/22 1627 0Oct 14, [id number]  WBC 6.9  --   --   --  25.5*  --  24.3*  --  29.6*  29.4*  --   --   --   --  28.8*  --   NEUTROABS 4.8  --   --   --   --   --   --   --  26.2*  --   --   --   --   --   --   HGB 14.3   < >  --    < > 13.4   < > 12.0*   < > 12.6*  12.7*   < > 12.6* 12.2* 15.0 14.2 14.3  HCT 42.8   < >  --    < > 38.5*   < > 35.4*   < > 36.5*  35.4*   < > 37.0* 36.0* 44.0 40.9 42.0  MCV 102.1*  --   --   --  100.0  --  100.9*  --  98.6  96.7  --   --   --   --  99.5  --   PLT 30*  --  25*  --  28*  --  24*  --  25*  23*  --   --   --   --  23*  --    < > = values in this interval not displayed.     Basic Metabolic Panel: Recent Labs  Lab 03/15/2022 1236 03/06/2022 1620 03/11/22 0336 03/11/22 0532 03/11/22 1600 03/11/22 1811 03/12/22 0438 03/12/22 0746 03/12/22 1513 03/12/22 1522 03/12/22 1627 17-Mar-2022 0253 03/17/2022 0525 03-17-22 0823  NA  --    < > 133*   < > 130*   < > 127*   < > 130* 127* 129* 129* 131* 130*  K  --    < > 4.7   < > 5.0   < > 3.9   < > 3.8 3.7 3.7 4.8 4.8 4.7  CL  --    < > 101  --  93*  --  91*  --  94*  --   --   --  99  --   CO2  --    < > 12*  --  16*  --  18*  --  20*  --   --   --  18*  --   GLUCOSE  --    < > 149*  --  241*  --  262*  --  193*  --   --   --  136*  --   BUN  --    < > 68*  --  68*  --  73*  --  66*  --   --   --  47*  --   CREATININE  --    < > 5.45*  --   5.89*  --  6.55*  --  5.36*  --   --   --  4.17*  --   CALCIUM  --    < > 8.0*  --  6.5*  --  5.9*  --  5.6*  --   --   --  6.4*  --   MG 1.9  --  2.5*  --   --   --   --   --   --   --   --   --  2.5*  --   PHOS  --   --   --   --   --   --   --   --  5.4*  --   --   --  6.6*  --    < > = values in this interval not displayed.    GFR: Estimated Creatinine Clearance: 21.2 mL/min (A) (by C-G formula based on SCr of 4.17 mg/dL (H)). Recent Labs  Lab 03/11/22 0336 03/11/22 0739 03/11/22 1607 03/11/22 2050 03/12/22 0438 03/12/22 0447 03/12/22 0742 03/12/22 0956 03/12/22 1502 03/28/2022 0525 03/28/2022 0742  WBC 25.5*  --  24.3*  --  29.6*  29.4*  --   --   --   --  28.8*  --   LATICACIDVEN  --    < >  --    < >  --    < > 4.0* 3.7* 4.0*  --  5.5*   < > = values in this interval not displayed.     Liver Function Tests: Recent Labs  Lab 03/08/2022 1104 03/11/22 0336 03/12/22 1513 Mar 28, 2022 0525  AST 95* 198*  --  5,877*  ALT 45* 74*  --  3,062*  ALKPHOS 520* 198*  --  300*  BILITOT 3.3* 3.8*  --  6.6*  PROT 5.0* 4.6*  --  3.9*  ALBUMIN 2.2* 1.8* <1.5* 1.6*    Recent Labs  Lab 03/11/22 0336 03/12/22 0956  LIPASE 26 167*    No results for input(s): "AMMONIA" in the last 168 hours.  ABG    Component Value Date/Time   PHART 7.330 (L) 03-28-22 0823   PCO2ART 38.1 March 28, 2022 0823   PO2ART 76 (L) 28-Mar-2022 0823   HCO3 20.1 March 28, 2022 0823   TCO2 21 (L) 2022/03/28 0823   ACIDBASEDEF 5.0 (H) 03/28/2022 0823   O2SAT 94 Mar 28, 2022 0823     Coagulation Profile: Recent Labs  Lab 03/08/2022 1104 03/09/2022 1454 03/11/22 0336  INR 1.9* 1.7* 1.9*     Cardiac Enzymes: No results for input(s): "CKTOTAL", "CKMB", "CKMBINDEX", "TROPONINI" in the last 168 hours.  HbA1C: Hgb A1c MFr Bld  Date/Time Value Ref Range Status  01/29/2022 03:04 PM 5.6 <5.7 % of total Hgb Final    Comment:    For the purpose of screening for the presence of diabetes: . <5.7%       Consistent  with the absence of diabetes 5.7-6.4%    Consistent with increased risk for diabetes             (prediabetes) > or =6.5%  Consistent with diabetes . This assay result is consistent with a decreased risk of diabetes. . Currently, no consensus exists regarding use of hemoglobin A1c for diagnosis of diabetes in children. . According to American Diabetes Association (ADA) guidelines, hemoglobin A1c <7.0% represents optimal control in non-pregnant diabetic patients. Different metrics may apply to specific patient populations.  Standards of Medical Care in Diabetes(ADA). Marland Kitchen   04/30/2021 11:02 AM 5.3 4.8 - 5.6 % Final    Comment:    (NOTE) Pre diabetes:          5.7%-6.4%  Diabetes:              >6.4%  Glycemic control for   <7.0% adults with diabetes     CBG: Recent Labs  Lab 03/12/22 1948 03/12/22 2330 03/28/2022 0329 Mar 28, 2022 0748 03/28/22 1132  GLUCAP 197* 157* 131* 128* 107*    Past Medical History:  He,  has a past medical history of Arteriosclerosis of aorta ([place]), Arthritis, Basal cell carcinoma, GERD (gastroesophageal reflux disease), Hyperlipidemia, Labile hypertension, OA (osteoarthritis) of knee (11/25/2020), Other testicular hypofunction, Prediabetes, and Vitamin D deficiency.   Surgical History:   Past Surgical History:  Procedure Laterality Date   CATARACT EXTRACTION, BILATERAL Bilateral 2019   Dr. Carmell Austria   dental implant surgery      IR PERC CHOLECYSTOSTOMY  02/27/2022   KNEE ARTHROSCOPY Left 1988  SKIN CANCER EXCISION     Ear, forehead, nose   TOTAL KNEE ARTHROPLASTY Left 11/25/2020   Procedure: TOTAL KNEE ARTHROPLASTY;  Surgeon: Gaynelle Arabian, MD;  Location: [facility];  Service: Orthopedics;  Laterality: Left;  18mn   TOTAL KNEE ARTHROPLASTY Right 05/05/2021   Procedure: TOTAL KNEE ARTHROPLASTY;  Surgeon: AGaynelle Arabian MD;  Location: [facility];  Service: Orthopedics;  Laterality: Right;     Social History:   reports that he quit smoking about  17 years ago. His smoking use included cigarettes. He started smoking about 53 years ago. He has a 55.50 pack-year smoking history. He has never used smokeless tobacco. He reports current alcohol use of about 2.0 - 3.0 standard drinks of alcohol per week. He reports that he does not use drugs.   Family History:  His family history includes Alzheimer's disease in his mother; Colon polyps in his father; Diabetes in his mother; Heart disease in his father; Hypertension in his father; Stroke in his father.   Allergies Allergies  Allergen Reactions   Lipitor [Atorvastatin] Other (See Comments)    Cloudy headed    Lopid [Gemfibrozil] Other (See Comments)    Unknown reaction. Patient doesn't remember taking this medication.   Penicillins Other (See Comments)    Unknown childhood reaction Tolerated Cephalosporin Date: 11/26/20. Tolerated Cephalosporin Date: 05/06/21.       Home Medications  Prior to Admission medications   Medication Sig Start Date End Date Taking? Authorizing Provider  calcium carbonate (TUMS - DOSED IN MG ELEMENTAL CALCIUM) 500 MG chewable tablet Chew 1,000 mg by mouth daily as needed for indigestion or heartburn.   Yes [provider]  citalopram (CELEXA) 20 MG tablet Take 1 tablet (20 mg total) by mouth daily. 07/16/21 07/16/22 Yes CLiane Comber NP  Cyanocobalamin (B-12 PO) Take 1 capsule by mouth daily.   Yes [provider]  ibuprofen (ADVIL) 200 MG tablet Take 400 mg by mouth every 4 (four) hours as needed for moderate pain.   Yes [provider]  rosuvastatin (CRESTOR) 5 MG tablet TAKE 1 TABLET BY MOUTH DAILY FOR CHOLESTEROL AND PLAQUE ON ARTERIES Patient taking differently: Take 5 mg by mouth daily. 12/02/21  Yes WAlycia Rossetti NP  VITAMIN D, CHOLECALCIFEROL, PO Take 10,000 Units by mouth daily.   Yes [provider]     Critical care time:

## 2022-03-22 NOTE — Progress Notes (Deleted)
NAME:  Eric [name], MRN:  [id number], DOB:  30-Sep-1950, LOS: 3 ADMISSION DATE:  03/09/2022,  Attending  MD:  Freda Jackson, CHIEF COMPLAINT:  Septic shock secondary to cholecystitis s/p VIR Cholecystostomy   History of Present Illness:  71 year old male with PMH of HLD, labile HTN, remote smoking history who presented from home with abdominal pain.  Patient reports he had sudden onset of abdominal pain with nausea and vomiting on 9/14 in which he attributed to food poisoning.  Has only been able to tolerate liquids, saltine crackers, and some protein shakes over the weekend.  Tried eating soup yesterday and vomited that up with one episode of diarrhea and had chills.  Denies fevers, SOB, or chest pain.  Has abrasion to left knee, patient does not remember how he obtained that.  Reports not voiding as normal.  Given worsening abdominal pain, he presented to ER.    Arrived in ER in Afib with RVR and normotensive initially.  Was placed on esmolol for rate control then became hypotensive requiring emergent cardioversion back to ST.  Ongoing hypotension despite 3L IVF, therefore started on norepinephrine.    Workup in ER noted for K 2.8, sCr 4.86 (previously 1.18 in 01/2022), bicarb 14, iCa 1.07, alk phos 520, AST/ ALT 95/45, t. Bili 3.3, trop hs 821, WBC 6.9, Hgb 14.3, plts 30, lactic acid > 9 with repeat 8.9.  CTA PE was neg for PE with no acute abnormalities in the chest, CTH neg, and CT a/p w/contrast concerning for acute cholecystitis and liver lesion adjacent to the gallbladder which could represent hepatic abscess, complex cyst, or neoplasm.  RUQ US showed gallbladder thickening and pericholecystic fluid, positive Murphy's sign suggestive of acalculous cholecystitis, mildly complex fluid collection adjacent to gallbladder seen again.  Cultures sent and started on empiric abx.  Treated with calcium, bicarb push then drip, and KCL.  Surgery and IR were consulted and IR performed a percutaneous  cholecystostomy with drain in place.   Pertinent  Medical History  HLD Labile HTN Emphysema of the lung Hx of Basal Cell Cancer Remote but significant smoking Hx Depression  Consults  Nephrology Palliative   Significant Hospital Events: Including procedures, antibiotic start and stop dates in addition to other pertinent events   9/19: Arterial line placed, IR percutaneous cholecystostomy, CVC catheter placement left IJV, intubation for respiratory failure 9/20 Right IJV HD catheter placed 9/21 CRRT initiated  Interim History / Subjective:  Overnight patient was progressively bradycardic despite cessation of amiodarone gtt., and Precedex.  Patient was bradycardic until 2 AM, which point patient's heart rate improved with simultaneous decrease in Levophed support.  Patient was weaned off of Levophed at this time; however, patient's maps began to decrease at approximately 5 AM and Levophed was restarted and increased to 17 mcg/minute.  Patient was stable at this dose for the duration of the shift.  Additionally, patient was hypocalcemic, and was repleted with 1 g of calcium gluconate at shift change and then additionally repleted in approximately 6 AM with 2 g of calcium gluconate.  Nursing notes that bicarb was not paused until shift change last night.  CRRT was tolerated overnight without issue.  Objective   Blood pressure 106/65, pulse 81, temperature 98.1 F (36.7 C), temperature source Axillary, resp. rate 20, height 6' (1.829 m), weight 114.3 kg, SpO2 95 %. CVP:  [8 mmHg-12 mmHg] 12 mmHg  Vent Mode: PRVC FiO2 (%):  [40 %] 40 % Set Rate:  [20 bmp-24 bmp]  20 bmp Vt Set:  [620 mL] 620 mL PEEP:  [5 cmH20-8 cmH20] 5 cmH20 Pressure Support:  [5 cmH20] 5 cmH20 Plateau Pressure:  [16 cmH20-23 cmH20] 23 cmH20   Intake/Output Summary (Last 24 hours) at 04/11/2022 0915 Last data filed at 04-11-22 0800 Gross per 24 hour  Intake 4445.86 ml  Output 4579 ml  Net -133.14 ml    Filed  Weights   03/12/2022 1115 03/12/22 0449 04-11-22 0500  Weight: 95.3 kg 118.4 kg 114.3 kg    Examination: General: Critically ill appearing, intubated and sedated and lying in bed HENT: CVC present in left IJV. HD catheter present in right IJV.  Lungs: Clear to ausculation in lung fields, breathing with ventilator. Cardiovascular: Regular rate and rhythm, S1 and S2 appreciated, but difficult to hear over ventilator. no obvious murmurs rubs or gallops Abdomen: Percutaneous Drain present in RUQ.has pain, and still draining dark yellow purulent drainage.  Abdomen is soft, nontender, nondistended Extremities: Severely mottled bilateral lower extremities, without dorsalis pedis/posterior tibialpulses auscultated bilaterally (doppler). Mottling is present to mid thigh and includes penis.  Neuro: Patient opens his eyes to pain, and groans with pain.  He opens his eyes spontaneously as well, but does not follow commands or track. GU: Foley in place, significant scrotal edema still in place with overlying erythema/mottling.  Edema is stable since yesterday, scrotal sling in place.  Resolved Hospital Problem list     Assessment & Plan:  Septic shock  2/2  acute acalculous cholecystitis s/p IR perc. Cholecystostomy Patient is critically ill. Current pressor regimen of Levophed and Vasopressin, with stable MAPs. As of today, patient distal pulses can no longer be auscultated via Doppler.  Percutaneous drain continues to be patent, and has drained under 120 mL over the past 24 hours.  Lactate at 4 from 3.7.  Most recent ABG with a pH of 7.33 from 7.341, PCO2 of 38.1 from 35.5, and a bicarb of 20.1 from 19.3.  Currently on Day 4 of Cefepime and Flagyl, Vanc was dc'ed 2/20.  Perc Drain culture has grown E.coli, but abx susceptibility is still pending.  Blood cultures negative at 48 hours, 72-hour check pending.  Most recent lipase of 167 - Continue NE and Vaso, with MAP goal 65-75, titrate as possible - follow  cultures, susceptibility - Continue cefepime, and flagyl - Daily CBC - Rechecking lipase  - Trend lactate - Surgery following, appreciate recs - Consulted palliative care, appreciate recs   Anuric AKI  Hypokalemia  AGMA/ lactic acidosis AKI likely pre-renal from poor PO intake, hypotension no prior history of renal disease.  Initial UA with mod Hgb, no RBC, 100 protein, neg leuk/ nitrates, follow UC. S/p 2 runs of KCL in ER. No hydronephrosis/ blockage noted on CT a/p.  As of today, trace edema on exam, patient is still anuric with only 21 cc of urine output over the past 24 hours.  Lactate of 4 from 3.7.  Morning ABG with a pH of 7.33.  CRRT was initiated yesterday without incidence, patient was kept net even.   -Continue CRRT, with goal to take volume off today per nephro recommendations - Nephrology following, appreciate continued recs - strict I/Os - avoid further nephrotoxins  - Continue foley  Significant Transaminitis Patient was originally admitted with elevated liver enzymes, with initial values of an AST at 95, ALT at 45, and alk phos at 520.  A possible hepatic cyst vs abscess was identified on initial imaging, but during IR procedure, there was no  identifiable abscess to drain.  As of today, there is been a significant increase in the liver enzymes.  AST is now at 5877 and ALT is at 3062 with the most recent alk phos at 300.  -We will order CT abdomen pelvis without contrast to assess for presence of intra-abdominal pathology.  Ordered without contrast per nephro recs given ongoing anuric AKI. - Consider hepatitis panel pending CT A/P  Hypocalcemia Patient continues to be hypocalcemic, with ongoing repletion necessary.  Patient is not using citrate is medium for CRRT.  Again likely secondary to AKI.  - will continue to monitor and replete as necessary. -Continue CRRT   Hypoxia respiratory failure  Patient was intubated given respiratory failure in the context of septic shock.  Initial CXR and CTA PE neg for acute process. As of today, breathing with vent.  On fentanyl gtt 49mg/min and precedex for sedation. Current Vent settings:PRVC: 30% Fio2, 24 RR, Vt set: 450 mL, PEEP 5cmh2o,.  As of today patient has tolerated  trials of PS/CPAP well, and been able to pull significant tidal volumes without issue.  Lung exam continues to demonstrate lungs are clear to auscultation bilaterally, notably without crackles or wheezes wheezes.  -Continue trials of PS/CPAP   New onset Afib with RVR  Elevated hs troponin  Bradycardia New onset Afib with no prior hx, s/p cardioversion in ER. Amiodarone was started at time of presentation and downtrend to 30 mg/minute.  Elevated troponin likely secondary to demand in context of shock, peak of 886. Formal echo demonstrated normal LVEF with no wall motion abnormalities as well as normal RV systolic function.  As of today, patient was progressively bradycardic throughout the night, and amiodarone was stopped. - ideally goal K> 4, Mag> 2   Coagulapathy, elevated INR Thrombocytopenia Initial DIC panel wnl.  Previous d-dimer was elevated and fibrinogen WNL. No schistocytes seen on initial smear.  As of today platelets at 23 from 28, HgB at 14.2 from 12.7.  Clinical picture not consistent with DIC, coagulopathy likely related to sepsis.  -Rechecking PT/INR  Nutrition Patient is reportedly been without food since time of initial symptom onset which was 9/14.  Given concern for mesenteric ischemia in the context of ongoing need for pressors, and possibility of intra-abdominal pathology, will proceed with TPN over enteral nutrition at this time. -Start TPN, with protein concentrated formula to minimize fluid input    HLD - hold crestor with transaminitis   Best Practice (right click and "Reselect all SmartList Selections" daily)   Diet/type: Will assess tomorrow DVT prophylaxis: SCD, GI prophylaxis: PPI Lines: Central line, Dialysis Catheter,  Arterial Line, and yes and it is still needed Foley:  Yes, and it is still needed Code Status:  full code Last date of multidisciplinary goals of care discussion [9/19,9/20] Wife, Son-in-law at bedside, updated on plan of care  Labs   CBC: Recent Labs  Lab 02/20/2022 1104 03/17/2022 1128 03/11/2022 1454 02/28/2022 2121 03/11/22 0336 03/11/22 0532 03/11/22 1607 03/11/22 1811 03/12/22 0438 03/12/22 0746 03/12/22 1522 03/12/22 1627 [id number]  WBC 6.9  --   --   --  25.5*  --  24.3*  --  29.6*  29.4*  --   --   --   --  28.8*  --   NEUTROABS 4.8  --   --   --   --   --   --   --  26.2*  --   --   --   --   --   --  HGB 14.3   < >  --    < > 13.4   < > 12.0*   < > 12.6*  12.7*   < > 12.6* 12.2* 15.0 14.2 14.3  HCT 42.8   < >  --    < > 38.5*   < > 35.4*   < > 36.5*  35.4*   < > 37.0* 36.0* 44.0 40.9 42.0  MCV 102.1*  --   --   --  100.0  --  100.9*  --  98.6  96.7  --   --   --   --  99.5  --   PLT 30*  --  25*  --  28*  --  24*  --  25*  23*  --   --   --   --  23*  --    < > = values in this interval not displayed.     Basic Metabolic Panel: Recent Labs  Lab 03/07/2022 1236 02/27/2022 1620 03/11/22 0336 03/11/22 0532 03/11/22 1600 03/11/22 1811 03/12/22 0438 03/12/22 0746 03/12/22 1513 03/12/22 1522 03/12/22 1627 03-29-2022 0253 03/29/22 0525 03-29-2022 0823  NA  --    < > 133*   < > 130*   < > 127*   < > 130* 127* 129* 129* 131* 130*  K  --    < > 4.7   < > 5.0   < > 3.9   < > 3.8 3.7 3.7 4.8 4.8 4.7  CL  --    < > 101  --  93*  --  91*  --  94*  --   --   --  99  --   CO2  --    < > 12*  --  16*  --  18*  --  20*  --   --   --  18*  --   GLUCOSE  --    < > 149*  --  241*  --  262*  --  193*  --   --   --  136*  --   BUN  --    < > 68*  --  68*  --  73*  --  66*  --   --   --  47*  --   CREATININE  --    < > 5.45*  --  5.89*  --  6.55*  --  5.36*  --   --   --  4.17*  --   CALCIUM  --    < > 8.0*  --  6.5*  --  5.9*  --  5.6*  --   --   --   6.4*  --   MG 1.9  --  2.5*  --   --   --   --   --   --   --   --   --  2.5*  --   PHOS  --   --   --   --   --   --   --   --  5.4*  --   --   --  6.6*  --    < > = values in this interval not displayed.    GFR: Estimated Creatinine Clearance: 21.2 mL/min (A) (by C-G formula based on SCr of 4.17 mg/dL (H)). Recent Labs  Lab 03/11/22 5993 03/11/22 5701 03/11/22 1607 03/11/22 2050 03/12/22 7793 03/12/22 9030 03/12/22 0923 03/12/22 0956 03/12/22 1502 29-Mar-2022  0525  WBC 25.5*  --  24.3*  --  29.6*  29.4*  --   --   --   --  28.8*  LATICACIDVEN  --    < >  --    < >  --  4.4* 4.0* 3.7* 4.0*  --    < > = values in this interval not displayed.     Liver Function Tests: Recent Labs  Lab 03/07/2022 1104 03/11/22 0336 03/12/22 1513 04/07/2022 0525  AST 95* 198*  --  5,877*  ALT 45* 74*  --  3,062*  ALKPHOS 520* 198*  --  300*  BILITOT 3.3* 3.8*  --  6.6*  PROT 5.0* 4.6*  --  3.9*  ALBUMIN 2.2* 1.8* <1.5* 1.6*    Recent Labs  Lab 03/11/22 0336 03/12/22 0956  LIPASE 26 167*    No results for input(s): "AMMONIA" in the last 168 hours.  ABG    Component Value Date/Time   PHART 7.330 (L) Apr 07, 2022 0823   PCO2ART 38.1 2022/04/07 0823   PO2ART 76 (L) 2022/04/07 0823   HCO3 20.1 04/07/22 0823   TCO2 21 (L) April 07, 2022 0823   ACIDBASEDEF 5.0 (H) 04-07-2022 0823   O2SAT 94 04-07-2022 0823     Coagulation Profile: Recent Labs  Lab 02/22/2022 1104 03/14/2022 1454 03/11/22 0336  INR 1.9* 1.7* 1.9*     Cardiac Enzymes: No results for input(s): "CKTOTAL", "CKMB", "CKMBINDEX", "TROPONINI" in the last 168 hours.  HbA1C: Hgb A1c MFr Bld  Date/Time Value Ref Range Status  01/29/2022 03:04 PM 5.6 <5.7 % of total Hgb Final    Comment:    For the purpose of screening for the presence of diabetes: . <5.7%       Consistent with the absence of diabetes 5.7-6.4%    Consistent with increased risk for diabetes             (prediabetes) > or =6.5%  Consistent with  diabetes . This assay result is consistent with a decreased risk of diabetes. . Currently, no consensus exists regarding use of hemoglobin A1c for diagnosis of diabetes in children. . According to American Diabetes Association (ADA) guidelines, hemoglobin A1c <7.0% represents optimal control in non-pregnant diabetic patients. Different metrics may apply to specific patient populations.  Standards of Medical Care in Diabetes(ADA). Marland Kitchen   04/30/2021 11:02 AM 5.3 4.8 - 5.6 % Final    Comment:    (NOTE) Pre diabetes:          5.7%-6.4%  Diabetes:              >6.4%  Glycemic control for   <7.0% adults with diabetes     CBG: Recent Labs  Lab 03/12/22 1519 03/12/22 1948 03/12/22 2330 04/07/2022 0329 April 07, 2022 0748  GLUCAP 179* 197* 157* 131* 128*    Past Medical History:  He,  has a past medical history of Arteriosclerosis of aorta ([place]), Arthritis, Basal cell carcinoma, GERD (gastroesophageal reflux disease), Hyperlipidemia, Labile hypertension, OA (osteoarthritis) of knee (11/25/2020), Other testicular hypofunction, Prediabetes, and Vitamin D deficiency.   Surgical History:   Past Surgical History:  Procedure Laterality Date   CATARACT EXTRACTION, BILATERAL Bilateral 2019   Dr. Carmell Austria   dental implant surgery      IR PERC CHOLECYSTOSTOMY  03/11/2022   KNEE ARTHROSCOPY Left 1988   SKIN CANCER EXCISION     Ear, forehead, nose   TOTAL KNEE ARTHROPLASTY Left 11/25/2020   Procedure: TOTAL KNEE ARTHROPLASTY;  Surgeon: Gaynelle Arabian, MD;  Location: [facility];  Service: Orthopedics;  Laterality: Left;  89mn   TOTAL KNEE ARTHROPLASTY Right 05/05/2021   Procedure: TOTAL KNEE ARTHROPLASTY;  Surgeon: AGaynelle Arabian MD;  Location: [facility];  Service: Orthopedics;  Laterality: Right;     Social History:   reports that he quit smoking about 17 years ago. His smoking use included cigarettes. He started smoking about 53 years ago. He has a 55.50 pack-year smoking history. He has  never used smokeless tobacco. He reports current alcohol use of about 2.0 - 3.0 standard drinks of alcohol per week. He reports that he does not use drugs.   Family History:  His family history includes Alzheimer's disease in his mother; Colon polyps in his father; Diabetes in his mother; Heart disease in his father; Hypertension in his father; Stroke in his father.   Allergies Allergies  Allergen Reactions   Lipitor [Atorvastatin] Other (See Comments)    Cloudy headed    Lopid [Gemfibrozil] Other (See Comments)    Unknown reaction. Patient doesn't remember taking this medication.   Penicillins Other (See Comments)    Unknown childhood reaction Tolerated Cephalosporin Date: 11/26/20. Tolerated Cephalosporin Date: 05/06/21.       Home Medications  Prior to Admission medications   Medication Sig Start Date End Date Taking? Authorizing Provider  calcium carbonate (TUMS - DOSED IN MG ELEMENTAL CALCIUM) 500 MG chewable tablet Chew 1,000 mg by mouth daily as needed for indigestion or heartburn.   Yes [provider]  citalopram (CELEXA) 20 MG tablet Take 1 tablet (20 mg total) by mouth daily. 07/16/21 07/16/22 Yes CLiane Comber NP  Cyanocobalamin (B-12 PO) Take 1 capsule by mouth daily.   Yes [provider]  ibuprofen (ADVIL) 200 MG tablet Take 400 mg by mouth every 4 (four) hours as needed for moderate pain.   Yes [provider]  rosuvastatin (CRESTOR) 5 MG tablet TAKE 1 TABLET BY MOUTH DAILY FOR CHOLESTEROL AND PLAQUE ON ARTERIES Patient taking differently: Take 5 mg by mouth daily. 12/02/21  Yes WAlycia Rossetti NP  VITAMIN D, CHOLECALCIFEROL, PO Take 10,000 Units by mouth daily.   Yes [provider]     Critical care time:

## 2022-03-22 DEATH — deceased

## 2022-07-15 ENCOUNTER — Ambulatory Visit: Payer: PPO | Admitting: Nurse Practitioner

## 2023-02-02 ENCOUNTER — Encounter: Payer: PPO | Admitting: Internal Medicine
